# Patient Record
Sex: Male | Born: 1935
Health system: Southern US, Community
[De-identification: ages and names within clinical notes are randomized; demographics above are authoritative.]

## PROBLEM LIST (undated history)

## (undated) DIAGNOSIS — F0390 Unspecified dementia without behavioral disturbance: Secondary | ICD-10-CM

## (undated) DIAGNOSIS — J189 Pneumonia, unspecified organism: Secondary | ICD-10-CM

## (undated) DIAGNOSIS — I10 Essential (primary) hypertension: Secondary | ICD-10-CM

## (undated) DIAGNOSIS — H349 Unspecified retinal vascular occlusion: Secondary | ICD-10-CM

## (undated) DIAGNOSIS — I639 Cerebral infarction, unspecified: Secondary | ICD-10-CM

## (undated) DIAGNOSIS — I6529 Occlusion and stenosis of unspecified carotid artery: Secondary | ICD-10-CM

## (undated) HISTORY — DX: Pneumonia, unspecified organism: J18.9

## (undated) HISTORY — DX: Essential (primary) hypertension: I10

## (undated) HISTORY — PX: HERNIA REPAIR: SHX51

## (undated) HISTORY — PX: TRANSURETHRAL RESECTION OF PROSTATE: SHX73

## (undated) HISTORY — DX: Occlusion and stenosis of unspecified carotid artery: I65.29

## (undated) HISTORY — PX: PROSTATE SURGERY: SHX751

## (undated) HISTORY — DX: Unspecified retinal vascular occlusion: H34.9

## (undated) HISTORY — DX: Cerebral infarction, unspecified: I63.9

---

## 2002-09-24 ENCOUNTER — Ambulatory Visit (HOSPITAL_COMMUNITY): Admission: RE | Admit: 2002-09-24 | Discharge: 2002-09-24 | Payer: Self-pay | Admitting: Internal Medicine

## 2002-09-24 HISTORY — PX: COLONOSCOPY: SHX174

## 2003-10-08 HISTORY — PX: JOINT REPLACEMENT: SHX530

## 2003-10-10 ENCOUNTER — Other Ambulatory Visit: Admission: RE | Admit: 2003-10-10 | Discharge: 2003-10-10 | Payer: Self-pay | Admitting: Dermatology

## 2005-07-31 ENCOUNTER — Other Ambulatory Visit: Admission: RE | Admit: 2005-07-31 | Discharge: 2005-07-31 | Payer: Self-pay | Admitting: Dermatology

## 2006-04-08 ENCOUNTER — Ambulatory Visit (HOSPITAL_COMMUNITY): Admission: RE | Admit: 2006-04-08 | Discharge: 2006-04-08 | Payer: Self-pay | Admitting: Family Medicine

## 2007-12-30 DIAGNOSIS — H349 Unspecified retinal vascular occlusion: Secondary | ICD-10-CM

## 2007-12-30 HISTORY — DX: Unspecified retinal vascular occlusion: H34.9

## 2008-01-05 ENCOUNTER — Encounter (INDEPENDENT_AMBULATORY_CARE_PROVIDER_SITE_OTHER): Payer: Self-pay | Admitting: Family Medicine

## 2008-01-05 ENCOUNTER — Ambulatory Visit (HOSPITAL_COMMUNITY): Admission: RE | Admit: 2008-01-05 | Discharge: 2008-01-05 | Payer: Self-pay | Admitting: Family Medicine

## 2008-01-08 ENCOUNTER — Ambulatory Visit: Payer: Self-pay | Admitting: Vascular Surgery

## 2008-01-11 ENCOUNTER — Ambulatory Visit (HOSPITAL_COMMUNITY): Admission: RE | Admit: 2008-01-11 | Discharge: 2008-01-11 | Payer: Self-pay | Admitting: Vascular Surgery

## 2008-01-11 ENCOUNTER — Ambulatory Visit: Payer: Self-pay | Admitting: Vascular Surgery

## 2008-01-26 ENCOUNTER — Inpatient Hospital Stay (HOSPITAL_COMMUNITY): Admission: RE | Admit: 2008-01-26 | Discharge: 2008-01-27 | Payer: Self-pay | Admitting: Vascular Surgery

## 2008-01-26 ENCOUNTER — Encounter: Payer: Self-pay | Admitting: Vascular Surgery

## 2008-01-26 HISTORY — PX: CAROTID ENDARTERECTOMY: SUR193

## 2008-02-17 ENCOUNTER — Ambulatory Visit (HOSPITAL_COMMUNITY): Admission: RE | Admit: 2008-02-17 | Discharge: 2008-02-17 | Payer: Self-pay | Admitting: Family Medicine

## 2008-02-19 ENCOUNTER — Ambulatory Visit: Payer: Self-pay | Admitting: Vascular Surgery

## 2008-03-15 ENCOUNTER — Ambulatory Visit (HOSPITAL_COMMUNITY): Admission: RE | Admit: 2008-03-15 | Discharge: 2008-03-15 | Payer: Self-pay | Admitting: Internal Medicine

## 2008-03-15 ENCOUNTER — Ambulatory Visit: Payer: Self-pay | Admitting: Internal Medicine

## 2008-03-15 ENCOUNTER — Encounter: Payer: Self-pay | Admitting: Internal Medicine

## 2008-03-15 HISTORY — PX: COLONOSCOPY: SHX174

## 2008-08-12 ENCOUNTER — Ambulatory Visit: Payer: Self-pay | Admitting: Vascular Surgery

## 2009-02-24 ENCOUNTER — Ambulatory Visit: Payer: Self-pay | Admitting: Vascular Surgery

## 2009-04-25 ENCOUNTER — Inpatient Hospital Stay (HOSPITAL_COMMUNITY): Admission: RE | Admit: 2009-04-25 | Discharge: 2009-04-28 | Payer: Self-pay | Admitting: Orthopedic Surgery

## 2010-02-23 ENCOUNTER — Ambulatory Visit: Payer: Self-pay | Admitting: Vascular Surgery

## 2011-01-13 LAB — PROTIME-INR
INR: 1.2 (ref 0.00–1.49)
INR: 2.5 — ABNORMAL HIGH (ref 0.00–1.49)
Prothrombin Time: 13.9 seconds (ref 11.6–15.2)
Prothrombin Time: 15.9 seconds — ABNORMAL HIGH (ref 11.6–15.2)
Prothrombin Time: 22.3 seconds — ABNORMAL HIGH (ref 11.6–15.2)

## 2011-01-13 LAB — BASIC METABOLIC PANEL
CO2: 27 mEq/L (ref 19–32)
CO2: 28 mEq/L (ref 19–32)
Calcium: 7.6 mg/dL — ABNORMAL LOW (ref 8.4–10.5)
Chloride: 100 mEq/L (ref 96–112)
Chloride: 99 mEq/L (ref 96–112)
Creatinine, Ser: 1.13 mg/dL (ref 0.4–1.5)
GFR calc Af Amer: >60 mL/min (ref >60)
GFR calc Af Amer: >60 mL/min (ref >60)
GFR calc non Af Amer: 56 mL/min — ABNORMAL LOW (ref >60)
GFR calc non Af Amer: >60 mL/min (ref >60)
Potassium: 3.7 mEq/L (ref 3.5–5.1)
Potassium: 4.4 mEq/L (ref 3.5–5.1)
Sodium: 132 mEq/L — ABNORMAL LOW (ref 135–145)
Sodium: 132 mEq/L — ABNORMAL LOW (ref 135–145)

## 2011-01-13 LAB — URINALYSIS, ROUTINE W REFLEX MICROSCOPIC
Hgb urine dipstick: NEGATIVE
Ketones, ur: NEGATIVE mg/dL
Urobilinogen, UA: 0.2 mg/dL (ref 0.0–1.0)
pH: 6 (ref 5.0–8.0)

## 2011-01-13 LAB — COMPREHENSIVE METABOLIC PANEL
AST: 20 U/L (ref 0–37)
BUN: 16 mg/dL (ref 6–23)
GFR calc Af Amer: >60 mL/min (ref >60)
GFR calc non Af Amer: >60 mL/min (ref >60)
Glucose, Bld: 93 mg/dL (ref 70–99)
Total Bilirubin: 0.6 mg/dL (ref 0.3–1.2)
Total Protein: 6.7 g/dL (ref 6.0–8.3)

## 2011-01-13 LAB — CBC
HCT: 31.5 % — ABNORMAL LOW (ref 39.0–52.0)
Hemoglobin: 10.5 g/dL — ABNORMAL LOW (ref 13.0–17.0)
Hemoglobin: 13.5 g/dL (ref 13.0–17.0)
MCHC: 33.4 g/dL (ref 30.0–36.0)
MCHC: 34.5 g/dL (ref 30.0–36.0)
Platelets: 124 10*3/uL — ABNORMAL LOW (ref 150–400)
Platelets: 159 10*3/uL (ref 150–400)
RBC: 2.93 MIL/uL — ABNORMAL LOW (ref 4.22–5.81)
RDW: 14 % (ref 11.5–15.5)
WBC: 12.4 10*3/uL — ABNORMAL HIGH (ref 4.0–10.5)
WBC: 7 10*3/uL (ref 4.0–10.5)
WBC: 7.8 10*3/uL (ref 4.0–10.5)
WBC: 9.7 10*3/uL (ref 4.0–10.5)

## 2011-01-13 LAB — APTT: aPTT: 29 seconds (ref 24–37)

## 2011-01-13 LAB — ABO/RH: ABO/RH(D): A NEG

## 2011-02-19 ENCOUNTER — Other Ambulatory Visit (INDEPENDENT_AMBULATORY_CARE_PROVIDER_SITE_OTHER): Payer: Medicare Other

## 2011-02-19 DIAGNOSIS — I6529 Occlusion and stenosis of unspecified carotid artery: Secondary | ICD-10-CM

## 2011-02-19 DIAGNOSIS — Z48812 Encounter for surgical aftercare following surgery on the circulatory system: Secondary | ICD-10-CM

## 2011-02-19 NOTE — Procedures (Signed)
CAROTID DUPLEX EXAM   INDICATION:  Followup right carotid endarterectomy.   HISTORY:  Diabetes:  No.  Cardiac:  No.  Hypertension:  Yes.  Smoking:  Previous.  Previous Surgery:  Right carotid endarterectomy on 01/26/2008.  CV History:  Asymptomatic.  Amaurosis Fugax No, Paresthesias No, Hemiparesis No                                       RIGHT             LEFT  Brachial systolic pressure:         180               170  Brachial Doppler waveforms:         Normal            Normal  Vertebral direction of flow:        Not detected      Antegrade  DUPLEX VELOCITIES (cm/sec)  CCA peak systolic                   72                118  ECA peak systolic                   141               125  ICA peak systolic                   92                143  ICA end diastolic                   26                20  PLAQUE MORPHOLOGY:                                    Mixed  PLAQUE AMOUNT:                      None              Moderate  PLAQUE LOCATION:                                      ICA/ECA/CCA   IMPRESSION:  1. Patent right carotid endarterectomy site with no right internal      carotid artery stenosis.  2. 40%-59% stenosis of the left internal carotid artery.  3. Doppler velocities of the right internal carotid artery appear less      than previously recorded when compared to the previous exam on      02/24/2009 with the left internal carotid artery remaining stable.   ___________________________________________  Larina Earthly, M.D.   CH/MEDQ  D:  02/23/2010  T:  02/23/2010  Job:  [id number]

## 2011-02-19 NOTE — Assessment & Plan Note (Signed)
OFFICE VISIT   [name], [name]  DOB:  16-May-1925                                       02/19/2008  [id number]#:[id number]   The patient presents today for followup of his right carotid  endarterectomy on 04/21 for severe carotid disease.  He had had a  preoperative retinal stroke.  He did well with his carotid surgery.  He  was discharged to home on postoperative day 1.  He continues to do well  with the usual amount of peri-incisional numbness.  He has had no  neurologic deficits.  He has a well-healed right carotid incision with  no bruits bilaterally.  He has had no other focal deficits but does  remain without vision in his right eye.  I am quite pleased with his  recovery and plan to see him again in 6 months with repeat carotid  duplex and he will notify us should he develop any neurologic deficits  in the interim.   Larina Earthly, M.D.  Electronically Signed   TFE/MEDQ  D:  02/19/2008  T:  02/22/2008  Job:  [id number]   cc:   Patrica Duel, M.D.  Beulah Gandy. Ashley Royalty, M.D.

## 2011-02-19 NOTE — Discharge Summary (Signed)
Troy Thompson, ERION NO.:  [id number]   MEDICAL RECORD NO.:  [id number]          PATIENT TYPE:  INP   LOCATION:  2924                         FACILITY:  [facility]   PHYSICIAN:  Wilmon Arms, PA    DATE OF BIRTH:  Sep 19, 1925   DATE OF ADMISSION:  01/26/2008  DATE OF DISCHARGE:  01/27/2008                               DISCHARGE SUMMARY   REFERRING PHYSICIANS:  Patrica Duel, MD  Beulah Gandy. Ashley Royalty, MD   DISCHARGE DIAGNOSES:  1. Carotid occlusive disease.  2. Hypertension.  3. Sudden onset of blindness with a right retinal arterial embolus,      December 30, 2007.   PROCEDURES PERFORMED:  Right carotid endarterectomy by Dr. Arbie Cookey, January 26, 2008.   CONSULTATIONS:  None.   DISCHARGE MEDICATIONS:  He is instructed to resume his home medicines  consisting of,  1. Toprol 50 mg p.o. q.a.m.  2. Norvasc 5 mg p.o. q.a.m.  3. Benicar/hydrochlorothiazide 40/12.5 p.o. q.a.m.  4. Aspirin 81 mg p.o. q.a.m.  5. Plavix 75 mg p.o. q.a.m.  6. Lumigan eye drops 1 drop, right eye, q.p.m.  7. Prilosec p.r.n.  8. Tylox 1-2 p.o. q.4 h. p.r.n. pain.   DISPOSITION:  He is being discharged to home in stable condition with  his wound healing well.  He is to clean his wound with soap and warm  water.  He is to observe the wound for drainage, increased pain,  swelling, redness, fever greater than 101.2, or other neurological  problems.  He is given an appointment to return to see Dr. Arbie Cookey in 2  weeks for a followup.   Brief identifying statements and complete details, please refer the  typed history and physical.  Briefly, this very pleasant 77 year old  gentleman presented to Dr. Arbie Cookey for evaluation of extra-arterial  cerebrovascular occlusive disease.  This followed the onset of blindness  with a right retinal arterial embolism on December 30, 2007.  He was found  to have mild-to-moderate stenosis with plaque present at the right  carotid bifurcation.  He underwent further workup  and Dr. Arbie Cookey  recommended right carotid endarterectomy.  He was informed of the risks  and benefits of the procedure and after careful consideration elected to  proceed with surgery.   HOSPITAL COURSE:  Preoperative workup was completed as an outpatient.  He was brought in through same day surgery and underwent the  aforementioned right carotid endarterectomy.  For complete details,  please refer the typed  operative report.  The procedure was without complications.  He was  returned to the postanesthesia care unit extubated.  Following  stabilization, he was transferred to a bed in surgical intensive care  unit.  He was observed overnight.  His exam was stable the next morning.  He was felt stable and was discharged home.      Wilmon Arms, PA     KEL/MEDQ  D:  01/27/2008  T:  01/27/2008  Job:  [id number]   cc:   Larina Earthly, M.D.  Beulah Gandy. Ashley Royalty, M.D.  Patrica Duel, M.D.

## 2011-02-19 NOTE — Consult Note (Signed)
NAME:  Troy Thompson, Troy Thompson           ACCOUNT NO.:  [id number]   MEDICAL RECORD NO.:  [id number]          PATIENT TYPE:  AMB   LOCATION:  SDS                          FACILITY:  [facility]   PHYSICIAN:  Marin Roberts, MDDATE OF BIRTH:  [birth year]   DATE OF CONSULTATION:  DATE OF DISCHARGE:  01/11/2008                                 CONSULTATION   REFERRING PHYSICIAN:  Di Kindle. Edilia Bo, M.D.   REASON FOR CONSULTATION:  Intracranial interpretation of carotid  arteriogram performed January 11, 2008.   FINDINGS:  After receiving the request for consultation by Dr. Edilia Bo,  I have reviewed the intracranial portion of the patient's recent carotid  arteriogram.  Single plane, AP and lateral views from a right carotid  injection demonstrates normal distal intracranial internal carotid  artery.  The M1 and A1 segments are normal.  AC and MC branch vessels  are unremarkable.  There is a fetal-type right posterior cerebral  artery.  The dural sinuses fill normally.   Injection of the left common carotid artery is somewhat more tenuous.  In his report, Dr. Edilia Bo stated that catheter position was very  proximal.  There was some reflux which fills the right common carotid as  well.  The AP projection is faint.  Nonetheless, there is no evidence  for significant stenosis, aneurysmal branch vessel occlusion.  The  lateral projection demonstrates somewhat better opacification.  A fetal-  type posterior cerebral artery is again seen.   Intracranial imaging from a left subclavian injection demonstrates  retrograde flow into the right vertebral artery from the left-sided  injection.  If this was truly a subclavian injection, this may suggest  proximal vertebral artery disease.  If it is a left vertebral injection,  this may be within normal limits.  The basilar artery essentially ends  in the superior cerebellar arteries bilaterally.   IMPRESSION:  1. No significant stenosis, aneurysm, or  branch vessel occlusion.  2. Question proximal vertebral artery disease on the right.  Please      see the above discussion.      Marin Roberts, MD  Electronically Signed     CM/MEDQ  D:  01/12/2008  T:  01/12/2008  Job:  [id number]

## 2011-02-19 NOTE — Assessment & Plan Note (Signed)
OFFICE VISIT   Thompson, Troy  DOB:  June 05, 1925                                       08/12/2008  EAVWU#:[id number]   The patient presents today for followup of his extracranial  cerebrovascular occlusive disease.  He is status post right carotid  endarterectomy on 01/26/2008.  He has no deficits since my last visit  with him.  He did suffer a right retinal artery embolus and continue to  be blind in his right eye.  This was a preoperative event that initiated  the evaluation of his carotid arteries.  He specifically denies any new  visual changes and denies any transient ischemic attack or focal  neurologic deficits.  He does remain stable with no new major medical  problems since my last visit with him.  He does not smoke cigarettes.   PHYSICAL EXAM:  Well developed, well nourished white male appearing  stated age of 89.  Blood pressure is 179/74, pulse 68, respirations 16.  His radial pulses are 2+.  His right carotid incision is well healed.  He has no bruits bilaterally.   He underwent repeat carotid duplex today and this reveals a wide patency  of his endarterectomy site on the right with no evidence of stenosis.  He does have a moderate 40-59% stenosis in his left carotid.  I  discussed symptoms of carotid disease with the patient and his grand  daughter present.  I have recommended that we continue to follow him in  our noninvasive vascular lab to rule out any progression of his  asymptomatic stenosis in his left carotid system and also to follow up  his endarterectomy.  He will notify should he develop any deficits.   Larina Earthly, M.D.  Electronically Signed   TFE/MEDQ  D:  08/12/2008  T:  08/15/2008  Job:  [id number]   cc:   Patrica Duel, M.D.

## 2011-02-19 NOTE — Op Note (Signed)
NAME:  Troy Thompson, Troy Thompson           ACCOUNT NO.:  [id number]   MEDICAL RECORD NO.:  [id number]          PATIENT TYPE:  AMB   LOCATION:  SDS                          FACILITY:  [facility]   PHYSICIAN:  Di Kindle. Edilia Bo, M.D.DATE OF BIRTH:  01-22-1925   DATE OF PROCEDURE:  DATE OF DISCHARGE:  01/11/2008                               OPERATIVE REPORT   PREOPERATIVE DIAGNOSIS:  Right retinal infarct with mild carotid  disease.   POSTOPERATIVE DIAGNOSIS:  Right retinal infarct with mild carotid  disease.   OPERATION/PROCEDURE:  1. Ultrasound-guided access of the right common femoral artery.  2. Arch aortogram.  3. Selective innominate arteriogram.  4. Selective right common carotid arteriogram.  5. Selective left subclavian arteriogram.  6. Selective left common carotid arteriogram.   SURGEON:  Di Kindle. Edilia Bo, M.D.   ANESTHESIA:  Local.   TECHNIQUE:  The patient was taken to the PV lab at [facility].  The groin was  prepped and draped in the usual sterile fashion.  After the skin was  anesthetized with 1%  lidocaine and under ultrasound guidance, the right  common femoral artery was cannulated and guidewire introduced into the  infrarenal aorta under fluoroscopic control.  The 5-French sheath was  introduced over the wire.  Next a long pigtail catheter was positioned  in the ascending aortic arch, and then arch aortogram obtained at 40  degrees LAO projection.  This catheter was then exchanged for an H1  catheter which was positioned into the innominate artery.  The  innominate artery injection was then made in the 30-degree RAO  projection.  Next, using a Berenstein 1 catheter one catheter, I was  able to exchange the H1 catheter and positioned the Berenstein catheter  into the right common carotid artery.  The wire was then advanced and  the catheter advanced in the common carotid artery and selective right  common carotid arteriogram obtained.  Intracranial and  extracranial  views were obtained.  The intracranial views will be interpreted  separately by separately by the neuroradiologist.  Next, the catheter  was then drawn back into the arch and positioned into the left  subclavian artery.  The wire was advanced, the catheter then advanced  and selective left subclavian arteriogram obtained.  Next, using H1  catheter, I was able to select the innominate and then advanced the wire  and then pulled the H1 catheter back into the proximal left common  carotid artery.  The patient had a bovine arch which made the angulation  somewhat difficult.  Despite multiple attempts with both the [facility] wire  and angled Glidewire, I was never able to advance to the wire far enough  to maintain catheter position.  Ultimately I used a CK1 catheter,  positioned this into the proximal left common carotid artery and  selective arteriogram was obtained, again with both intracranial and  extracranial views.   FINDINGS:  The right vertebral artery is quite diminutive in size.  The  left vertebral artery is patent with no significant stenosis identified.  On the right side, there is a smooth 20-30% eccentric plaque in the  common carotid  artery with the small ulcer proximally which is fairly  smooth.  On the left, there is some calcific plaque of the aortic  bifurcation, but no significant stenosis is identified.  There is no  subclavian stenosis on either side.   CONCLUSIONS:  1. Mild 20-30% right internal carotid artery stenosis with small ulcer      proximally which is fairly smooth.  2. Left vertebral dominant.  3. Mild plaque at left common carotid artery bifurcation with no      significant stenosis.      Di Kindle. Edilia Bo, M.D.  Electronically Signed     CSD/MEDQ  D:  01/11/2008  T:  01/11/2008  Job:  [id number]

## 2011-02-19 NOTE — Procedures (Signed)
CAROTID DUPLEX EXAM   INDICATION:  Carotid artery disease.   HISTORY:  Diabetes:  No.  Cardiac:  No.  Hypertension:  Yes.  Smoking:  Previous.  Previous Surgery:  Right carotid endarterectomy on 01/26/08.  CV History:  Currently asymptomatic.  Amaurosis Fugax No, Paresthesias No, Hemiparesis No.                                       RIGHT             LEFT  Brachial systolic pressure:         132               136  Brachial Doppler waveforms:         Normal            Normal  Vertebral direction of flow:        Not detected      Antegrade  DUPLEX VELOCITIES (cm/sec)  CCA peak systolic                   65                102  ECA peak systolic                   195               168  ICA peak systolic                   130               173  ICA end diastolic                   30                24  PLAQUE MORPHOLOGY:                                    Mixed  PLAQUE AMOUNT:                      None              Mild/moderate  PLAQUE LOCATION:                                      ICA/ECA/CCA   IMPRESSION:  1. Widely patent right carotid endarterectomy site noted.  There is a      mildly increased Doppler velocity noted in the right proximal      internal carotid artery which appears to be most likely due to a      change in vessel diameter.  2. 40-59% stenosis of the left internal carotid artery.  3. Flow was not detected in the right vertebral artery.  4. No significant change noted when compared to the previous      examination on 08/12/08.   ___________________________________________  Larina Earthly, M.D.   CH/MEDQ  D:  02/24/2009  T:  02/24/2009  Job:  [id number]

## 2011-02-19 NOTE — Procedures (Signed)
CAROTID DUPLEX EXAM   INDICATION:  Followup right carotid endarterectomy.   HISTORY:  Diabetes:  No.  Cardiac:  No.  Hypertension:  Yes.  Smoking:  Quit about 40 years ago.  Previous Surgery:  No.  CV History:  No.  Amaurosis Fugax No, Paresthesias No, Hemiparesis No                                       RIGHT             LEFT  Brachial systolic pressure:         130               128  Brachial Doppler waveforms:         Biphasic          Biphasic  Vertebral direction of flow:        Not well visualized                 Antegrade  DUPLEX VELOCITIES (cm/sec)  CCA peak systolic                   81                131  ECA peak systolic                   235               131  ICA peak systolic                   118               155  ICA end diastolic                   28                23  PLAQUE MORPHOLOGY:                  None              Heterogeneous  PLAQUE AMOUNT:                      None              Moderate  PLAQUE LOCATION:                    None              CCA/ICA/ECA   IMPRESSION:  1. 40-59% stenosis noted in the left ICA.  2. Normal carotid duplex noted at the right ICA.  3. Status post right carotid endarterectomy.  4. Antegrade left vertebral artery.   ___________________________________________  Larina Earthly, M.D.   MG/MEDQ  D:  08/12/2008  T:  08/12/2008  Job:  [id number]

## 2011-02-19 NOTE — Op Note (Signed)
NAME:  Troy Thompson, Troy Thompson           ACCOUNT NO.:  [id number]   MEDICAL RECORD NO.:  [id number]          PATIENT TYPE:  AMB   LOCATION:  DAY                           FACILITY:  [facility]   PHYSICIAN:  R. Roetta Sessions, M.D. DATE OF BIRTH:  [birth year]   DATE OF PROCEDURE:  03/15/2008  DATE OF DISCHARGE:                               OPERATIVE REPORT   INDICATIONS FOR PROCEDURE:  The patient is an 77 year old Caucasian male  with a personal history of colonic adenoma.  Last exam was 2003.  She  had diverticulosis and a single adenoma removed.   Positive family history of colorectal cancer in his father but he was  diagnosed with the disease in the ninth decade of life.  Troy Thompson  has no symptoms, currently is here for surveillance.  The approach has  been discussed with the patient.  The risks, benefits and alternatives  of limitations have been reviewed.  All parties questions answered and  all parties agreeable.  Please see the documentation in medical record.   PROCEDURE NOTE:  O2 saturation, blood pressure, pulse and respirations  were monitored throughout the entire procedure.   CONSCIOUS SEDATION:  Versed 3 mg, Demerol 75 mg IV in divided doses.   INSTRUMENT:  Pentax video chip system.   FINDINGS:  Digital rectal exam revealed no abnormalities.  Endoscopic  findings:  The prep was good.  Colon:  Colonic mucosa was surveyed from  the rectosigmoid junction through the left transverse, right colon,  appendiceal orifice, cecum, and ileocecal valve.  These structures were  well seen and photographed for the record.  At the base of the cecum,  there was a diminutive polyp.  It was cold biopsied/ removed.  From this  level, the scope was withdrawn.  All previous mucosal surfaces were  again seen.  The patient had left-sided transverse diverticula and  colonic mucosa appeared normal.  The scope was pulled down the rectum.  Examination of the rectal mucosa including retroflexion of the  anal  verge demonstrated no abnormalities.  The patient tolerated the  procedure well and was reactive to endoscopy.   IMPRESSION:  1. Normal rectum.  2. Left-sided transverse diverticula.  3. Diminutive cecal polyp status post cold biopsy.  Remainder of the      colonic mucosa appeared normal.   RECOMMENDATIONS:  1. Diverticulosis, literature provided to Mr. Merilynn Finland.  2. Follow-up on path.  3. Further recommendations to follow.      Jonathon Bellows, M.D.  Electronically Signed     RMR/MEDQ  D:  03/15/2008  T:  03/16/2008  Job:  [id number]

## 2011-02-19 NOTE — Op Note (Signed)
Troy Thompson, Troy Thompson           ACCOUNT NO.:  [id number]   MEDICAL RECORD NO.:  [id number]          PATIENT TYPE:  INP   LOCATION:  2924                         FACILITY:  [facility]   PHYSICIAN:  Larina Earthly, M.D.    DATE OF BIRTH:  10-01-1925   DATE OF PROCEDURE:  01/26/2008  DATE OF DISCHARGE:                               OPERATIVE REPORT   PREOPERATIVE DIAGNOSIS:  Right retinal embolus with probable carotid  source.   POSTOPERATIVE DIAGNOSIS:  Right retinal embolus with probable carotid  source.   PROCEDURE:  Right carotid endarterectomy and primary closure.   SURGEON:  Larina Earthly, MD   ASSISTANT:  Nurse.   ANESTHESIA:  General endotracheal.   COMPLICATIONS:  None.   DISPOSITION:  Recovery room, stable.   PROCEDURE IN DETAIL:  The patient was taken to operating room and placed  in supine position.  The area of the right neck was prepped and draped  in usual sterile fashion.  Incision was made on the anterior  sternocleidomastoid and carried down to the platysma with  electrocautery.  The sternocleidomastoid was reflected posteriorly and  the carotid sheath was opened.  The facial vein was ligated and divided.  Dissection was then continued onto the bifurcation.  The common carotid  artery was encircled with umbilical tape and Rumel tourniquet.  Dissection was turned onto the bifurcation.  The external carotid was  encircled with a blue vessel loop.  The internal carotid was encircled  with an umbilical tape and Rumel tourniquet.  The vagus and hypoglossal  nerves were identified and preserved.  The patient was given 8000 units  of intravenous heparin.  After adequate circulation time, the internal,  external, and common carotid arteries were occluded.  The common carotid  artery was opened with an 11-blade extending using with Potts scissors  through the plaque onto the internal carotid with Potts scissors.  A 10  shunt was passed up the internal carotid artery,  allowed the back bleed,  then down the common carotid where it was secured with Rumel  tourniquets.  There was excellent backbleeding.  The patient did have a  posterior plaque with ulceration in the posterior aspect of the carotid  bulb.  The endarterectomy was begun on the common carotid artery and the  plaque was divided proximally with Potts scissors.  The endarterectomy  was carried onto the bifurcation.  The external carotid  endarterectomized by eversion technique and internal carotid artery was  endarterectomized in an open fashion.  Remaining atheromatous removed  from endarterectomy plane.  Due to the large caliber of the internal  carotid artery, a decision was made to close the artery primarily.  This  was done with a running 6-0 Prolene suture beginning at the proximal and  distal arteriotomy and sewing towards the center.  Prior to completion  of the closure, the shunt was removed, and the usual flushing maneuvers  were undertaken.  The anastomosis was completed and flow was restored  first to the external, then the internal carotid arteries.  Excellent  flow characteristics were noted with handheld Doppler in the internal  and external carotid arteries.  The patient was given 50 mg of protamine  reverse heparin.  Wounds were irrigated with saline.  Hemostasis  obtained with electrocautery.  Wounds were closed with 3-0 Vicryl in the  subcutaneous tissue to reapproximate sternocleidomastoid over the  carotid sheath.  Next, the  platysma was closed with running 3-0 Vicryl suture, and finally the skin  was closed with a 4-0 subcuticular Vicryl stitch.  Sterile dressing was  applied.  The patient was awakened, neurologically intact in the  operative room, and transferred to recovery in stable condition.      Larina Earthly, M.D.  Electronically Signed     TFE/MEDQ  D:  01/26/2008  T:  01/27/2008  Job:  [id number]   cc:   Beulah Gandy. Ashley Royalty, M.D.  Patrica Duel, M.D.

## 2011-02-19 NOTE — Consult Note (Signed)
NEW PATIENT CONSULTATION   Troy Thompson, Troy Thompson  DOB:  15-Jan-1925                                       01/08/2008  WUXLK#:[id number]   Patient presents today for evaluation of extraarterial  cerebrovascular  occlusive disease.  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  He has had minimal recovery from  this.  He has no prior history of strokes, amaurosis fugax, or transient  ischemic attack.  He does have a history of hypertension, otherwise is  healthy.  He has no significant cardiac disease.   FAMILY HISTORY:  Negative for premature atherosclerotic disease.   SOCIAL HISTORY:  He is married.  He is retired.  He does not smoke,  having quit 20 years ago and does not drink alcohol on a regular basis.   REVIEW OF SYSTEMS:  Positive for pain in his legs with walking,  arthritis, and no recent eyesight changes noted.   ALLERGIES TO MEDICATIONS:  None.   CURRENT MEDICATIONS:  Toprol, Norvasc, Benicar, aspirin, and Plavix.   PHYSICAL EXAMINATION:  Blood pressure is 196/85 in his right arm, 184/80  in his left arm.  Pulse 62, respirations 18.  Oxygen saturations are 96%  on room air.  He does have no carotid bruits bilaterally.  His radial  pulses are 2+ bilaterally.  He has 2+ femoral pulses bilaterally.  Heart:  Regular rate and rhythm.  Chest:  Clear bilaterally.   I have reviewed his carotid duplex with him.  This reveals mild-to-  moderate stenosis with some plaque present in the right carotid  bifurcation.  I explained that he could have irregular ulcerative plaque  that would be hidden and would not be apparent by duplex.  I have  recommended cerebral arteriography to rule out embolic source.  He will  undergo this at [facility] by Dr. Cari Caraway on 01/11/08.  He will continue his aspirin.  We will hold his Plavix until after the  procedure.   Larina Earthly, M.D.  Electronically Signed   TFE/MEDQ   D:  01/08/2008  Thompson:  01/11/2008  Job:  [id number]   cc:   Patrica Duel, M.D.  Beulah Gandy. Ashley Royalty, M.D.

## 2011-02-19 NOTE — H&P (Signed)
NAME:  Troy Thompson, Troy Thompson           ACCOUNT NO.:  [id number]   MEDICAL RECORD NO.:  [id number]          PATIENT TYPE:  INP   LOCATION:  NA                           FACILITY:  [facility]   PHYSICIAN:  Ollen Gross, M.D.    DATE OF BIRTH:  02-Aug-1925   DATE OF ADMISSION:  DATE OF DISCHARGE:                              HISTORY & PHYSICAL   CHIEF COMPLAINT:  Bilateral knee pain worse in the right than in the  left.   HISTORY OF PRESENT ILLNESS:  Patient is an 77 year old male who has  continued bilateral knee pain.  Patient has been followed by Dr. Lequita Halt  for this.  He has had a series of Synvisc injections that did not give  him a tremendous amount of relief.  The patient's pain seems to be  getting worse with activity and is starting to limit what he is able to  do.  Patient has end-stage arthritis in the right knee and now presents  to have a total knee arthroplasty in the right knee and also a cortisone  injection in the left knee.   ALLERGIES:  None.   CURRENT MEDICATIONS:  1. Toprol 50 mg 1/2 tablet daily.  2. Norvasc 5 mg daily.  3. Aspirin 81 mg daily.  4. Diovan 320 mg daily.  5. Plavix 75 mg daily.  6. Zocor 20 mg daily.  7. Prilosec 20 mg daily.   PAST MEDICAL HISTORY:  1. Stroke in March 2009.  2. Hypertension.  3. Arthritis.   PAST SURGICAL HISTORY:  1. Carotid endarterectomy, this was done in 2009 while the patient was      staying in the hospital after his stroke.  2. Prostate surgery.  3. Patient has had 2 hernia repairs.   FAMILY HISTORY:  Father passed at age 41 of colon cancer.  Mother passed  at age 4 of heart attack.   SOCIAL HISTORY:  The patient is married, he is retired, he lives at  home.  Patient does not drink and patient does not smoke.  The patient  has 2 children.  The patient states that he has family and support lined  up for caregivers to provide care after surgery and is planning on  returning to his home after surgery.   REVIEW OF  SYSTEMS:  No fevers, chills or weight change.  HEENT/NEURO:  No headache, no balance issues.  Positive for blurred vision in his left  eye.  Note that this is not a new occurrence.  The patient states he has  had vision problems for many years in that eye.  RESPIRATORY:  Negative  for shortness of breath.  Negative for cough.  Negative for wheezing.  Patient states his last chest x-ray was 2009.  CARDIOVASCULAR:  Negative  for chest pain, negative palpitations.  The patient states his last  electrocardiogram was also 2009.  GI:  Negative for nausea, negative for  vomiting, negative for blood in his stool.  GU:  Negative for painful  urination, negative for blood in urine, positive for urinating at night  time.  MUSCULOSKELETAL:  Positive for joint pain medially in  the knees  bilaterally.  Negative for muscular weakness.  Note, patient has no  residual neurologic deficit post CVA.   PHYSICAL EXAMINATION:  Pulse 68, respirations 18, blood pressure was a  little high, in his left arm it was 175/80 and in his right arm it was  170/75.  Patient states that he has white coat syndrome and it is  commonly elevated in the doctor's office.  GENERAL:  An 77 year old male in no acute distress, alert and oriented  x3.  The patient seems to be a good historian.  HEENT:  Unremarkable.  NECK:  Supple, full range of motion, negative for lymphadenopathy.  CHEST:  The patient's lungs are clear with equal breath sounds  bilaterally without rales, rhonchi or wheezes.  HEART:  Regular rate and rhythm, distant heart sounds without murmur.  ABDOMEN:  Soft, nontender.  Bowel sounds present.  No guarding with  palpitation.  EXTREMITIES:  Right knee range of motion 5 to 120 degrees, negative for  effusion, moderate crepitus with palpation on range of motion and he is  tender more so on the medial aspect of the knee than the lateral.  Left  knee range of motion is 0 to 130 degrees, he does have crepitus on   palpation with range of motion and it is negative for instability.  PERIPHERAL VASCULAR:  Negative for carotid bruit.  Carotid pulses are  2+.  Radial pulses are 2+ bilaterally.  Dorsalis pedis pulses are 2+  bilaterally.   IMPRESSION:  Osteoarthritis, right knee.   PLAN OF ACTION:  Patient is to undergo right total knee arthroplasty.  Note, patient has been seen and cleared for surgery by his primary care  physician, Dr. Patrica Duel, we are waiting on this paperwork.      Rozell Searing, [facility]      Ollen Gross, M.D.  Electronically Signed    LD/MEDQ  D:  04/23/2009  T:  04/23/2009  Job:  [id number]

## 2011-02-19 NOTE — Op Note (Signed)
Troy Thompson, Troy Thompson           ACCOUNT NO.:  [id number]   MEDICAL RECORD NO.:  [id number]          PATIENT TYPE:  INP   LOCATION:  0003                         [facility]ACILITY:  [facility]   PHYSICIAN:  Ollen Gross, M.D.    DATE OF BIRTH:  01-09-1925   DATE OF PROCEDURE:  04/25/2009  DATE OF DISCHARGE:                               OPERATIVE REPORT   PREOPERATIVE DIAGNOSIS:  Osteoarthritis, bilateral knees.   POSTOPERATIVE DIAGNOSIS:  Osteoarthritis, bilateral knees.   PROCEDURES:  1. Right total knee arthroplasty.  2. Left knee cortisone injection.   SURGEON:  Ollen Gross, M.D.   ASSISTANT:  Rozell Searing, PA-C.   ANESTHESIA:  Spinal with Duramorph.   ESTIMATED BLOOD LOSS:  Minimal.   DRAINS:  None.   TOURNIQUET TIME:  41 minutes at 300 mmHg.   COMPLICATIONS:  None.   CONDITION:  Stable to recovery room.   CLINICAL NOTE:  Troy Thompson is an 77 year old male who has end-stage  arthritis of the right worse than left knee.  The right knee is far more  symptomatic.  He has failed nonoperative management and presents now for  right total knee arthroplasty.  In addition, he requested a left knee  cortisone injection.   PROCEDURE IN DETAIL:  After successful administration of spinal  anesthetic, a tourniquet is placed high on his right thigh and the right  lower extremity was prepped and draped in the usual sterile fashion.  The extremity is wrapped in an Esmarch, the knee flexed, the tourniquet  inflated to 300 mmHg.  A midline incision is made with a 10 blade  through subcutaneous tissue to the level of the extensor mechanism.  A  fresh blade is used make a medial parapatellar arthrotomy.  Soft tissue  on the proximal medial tibia is subperiosteally elevated to the joint  line with a knife and into the semimembranosus bursa with a Cobb  elevator.  The soft tissue laterally is elevated, with attention being  paid to avoid patellar tendon on tibial tubercle.  The patella  is  subluxated laterally, the knee flexed 90 degrees, and the ACL and PCL  removed.  A drill is used create a starting hole in the distal femur.  The canal is thoroughly irrigated.  The 5-degree right valgus alignment  guide is placed referencing off the posterior condyles, rotation is  marked, and a block pinned to remove 11 mm off the distal femur.  Distal  femoral resection is made with an oscillating saw.  I took 11 mm because  of the flexion contracture.  A sizing block is then placed.  A size 5 is  most appropriate.  Rotation is marked at the epicondylar axis.  A size 5  cutting block is placed and then the anterior-posterior and chamfer cuts  made.   The tibia is subluxated forward and the menisci are removed.  An  extramedullary tibial alignment guide is placed referencing proximally  at the medial aspect of the tibial tubercle and distally along the  second metatarsal axis and tibial crest.  The block is pinned to remove  about 2 mm off the more  deficient medial side.  Tibial resection is made  with an oscillating saw.  Size 4 is the most appropriate tibial  component and the proximal tibia is prepared with the modular drill and  keel punch for the size 4.  Femoral preparation is completed with the  intercondylar cut.   The size 4 mobile bearing tibial trial and size 5 posterior stabilized  femoral trial and a 10-mm posterior stabilized rotating platform insert  trial are placed.  With the 10, full extension is achieved, with  excellent varus-valgus and anterior-posterior balance throughout full  range of motion.  The patella is everted and the thickness measured to  be 24 mm.  Freehand resection is taken to 14 mm, the 38 template is  placed, lug holes are drilled, trial patella is placed and it tracks  normally.  Osteophytes are removed off the posterior femur with the  trial in place.  All the trials are removed and the cut bone surfaces  prepared with pulsatile lavage.   Cement is mixed and once ready for  implantation, the size 4 mobile bearing tibial tray, size 5 posterior  stabilized femur and 38 patella are cemented into place and the patella  is held with a clamp.  The trial 10-mm insert is placed, the knee held  in full extension, and all extruded cement removed.  The cement is fully  hardened, then the wound is irrigated with saline solution and FloSeal  injected on the posterior capsule, medial and lateral gutters, and  suprapatellar area.  A moist sponge is placed and the tourniquet  released for a total time of 41 minutes.  The sponge is held for 2  minutes and removed.  Minimal bleeding is encountered.  The bleeding  that is encountered is stopped with electrocautery.  The wound is  copiously irrigated with saline solution and the arthrotomy closed with  interrupted #1 PDS.  Flexion against gravity is about 140 degrees.  The  subcu is closed with interrupted 2-0 Vicryl and subcuticular running 4-0  Monocryl.  The incision is cleaned and dried and Steri-Strips and a  bulky sterile dressing applied.  He is then placed into a knee  immobilizer.  I then prepped the left knee and injected it with  lidocaine and Depo-Medrol with no problems.  He tolerated that procedure  well and is subsequently awakened and transported to recovery in stable  condition.      Ollen Gross, M.D.  Electronically Signed     FA/MEDQ  D:  04/25/2009  T:  04/26/2009  Job:  [id number]

## 2011-02-22 NOTE — Op Note (Signed)
NAME:  Troy Thompson, Troy Thompson                     ACCOUNT NO.:  [id number]   MEDICAL RECORD NO.:  [id number]                   PATIENT TYPE:  AMB   LOCATION:  DAY                                  FACILITY:  [facility]   PHYSICIAN:  R. Roetta Sessions, M.D.              DATE OF BIRTH:  19-Aug-1925   DATE OF PROCEDURE:  09/24/2002  DATE OF DISCHARGE:                                 OPERATIVE REPORT   PROCEDURE:  Colonoscopy with biopsy.   ENDOSCOPIST:  Gerrit Friends. Rourk, M.D.   INDICATIONS FOR PROCEDURE:  The patient is a 77 year old gentleman referred  at the request of Dr. Nobie Putnam for high-risk colorectal cancer screening.  Family history is significant in that his father succumbed to colorectal  neoplasia.  He had a sigmoidoscopy in 1998 and was found to have only  diverticulosis.  Colonoscopy is now being done as a high risk screening  maneuver.  This approach has been discussed with the patient previously and  again today, at the beside.  The potential risks, benefits, and alternatives  have been reviewed.  Questions answered.  He is agreeable.  I feel that he  is low risk for conscious sedation.  ASA 2.   DESCRIPTION OF PROCEDURE:  O2 saturation, blood pressure, pulse and  respirations were monitored throughout the entirety of the procedure.   CONSCIOUS SEDATION:  Versed 4 mg IV, Demerol 100 mg IV in divided doses.   INSTRUMENT:  Olympus video chip adult colonoscope.   FINDINGS:  Digital rectal exam revealed no abnormalities.   ENDOSCOPIC FINDINGS:  The prep was good.   RECTUM:  Examination of the rectal mucosa including the retroflex view of  the anal verge revealed no abnormalities.   COLON:  The colonic mucosa was surveyed from the rectosigmoid junction  through the left transverse and right colon through the area of the  appendiceal orifice, ileocecal valve, and cecum.  These structures were well  seen and photographed for the record.  The patient was noted to have a 4 mm  polyp at the splenic flexure and a left-sided diverticulum.  The remainder  of the colonic mucosa appeared normal.   From the level of the cecum and ileocecal valve the scope was slowly  withdrawn.  All previously mentioned mucosal surfaces were again seen; no  other abnormalities were observed.  The polyp at the splenic flexure was  cold biopsied/removed.  The patient tolerated the procedure well and was  reacted in endoscopy.   IMPRESSION:  1. Normal rectum.  2. Left-sided diverticulum.  3. Polyp at the splenic flexure removed as described above.  4. The remainder of the colonic mucosa appeared normal.   RECOMMENDATIONS:  1. Follow up on path.  2. Diverticulosis literature provided the patient.  3. Further recommendations to follow.  Jonathon Bellows, M.D.    RMR/MEDQ  D:  09/24/2002  T:  09/25/2002  Job:  [id number]

## 2011-02-22 NOTE — Discharge Summary (Signed)
Troy Thompson, Troy Thompson           ACCOUNT NO.:  [id number]   MEDICAL RECORD NO.:  [id number]          PATIENT TYPE:  INP   LOCATION:  1618                         FACILITY:  [facility]   PHYSICIAN:  Ollen Gross, M.D.    DATE OF BIRTH:  1925/03/17   DATE OF ADMISSION:  04/25/2009  DATE OF DISCHARGE:  04/28/2009                               DISCHARGE SUMMARY   ADMITTING DIAGNOSES:  1. Osteoarthritis, right knee.  2. History of stroke, March 2009.  3. Hypertension.  4. Arthritis.   DISCHARGE DIAGNOSES:  1. Osteoarthritis, bilateral knees, status post right total knee      arthroplasty, left knee cortisone injection.  2. Postop hyponatremia.  3. History of stroke, March 2009.  4. Hypertension.  5. Arthritis.   PROCEDURE:  April 25, 2009, right total knee with left knee cortisone  injection.   SURGEON:  Dr. Lequita Halt.   ASSISTANT:  Rozell Searing, P.A.-C.   ANESTHESIA:  Spinal with Duramorph.   TOURNIQUET TIME:  41 minutes.   CONSULTS:  None.   BRIEF HISTORY:  Troy Thompson is an 77 year old male with end-stage  arthritis of the right greater than left knee pain, right is far more  symptomatic.  Failed nonoperative management.  Now presents for a total  knee arthroplasty.   LABORATORY DATA:  Preop CBC, hemoglobin of 13.5, hematocrit of 40.3,  white cell count 7.0, platelets 170.  Hemoglobin dropped down to 10.5,  then 8.9, back up a little bit to 9.1 and 26.3 crit.  PT/PTT on  admission 13.9 and 29 with INR 1.0.  Serial pro times followed per  Coumadin protocol, last noted PT/INR 28.4 and 2.5.  Chem panel on  admission all within normal limits.  Serial BMETs were followed.  Sodium  dropped down from 140 to 132 where it stabilized, last noted at 132.  Remaining electrolytes remained within normal limits.  Glucose did go up  from 93 to 172, back down to 111.  Preop UA was negative.  Preop EKG  dated March 21, 2009, sinus rhythm, unable to read signature.   HOSPITAL COURSE:   Patient admitted to [facility], taken to the  OR.  Underwent above-stated procedure without complication.  Patient  tolerated procedure well and later transferred to recovery room on  orthopedic floor.  Started on PCA and p.o. analgesic pain control  following surgery.  He was given 24 hours of postop IV antibiotics.  Had  a good night.  Seen on day 1.  Sodium was down a little bit, felt to be  a dilutional component.  Had a low output so we had to give some extra  fluids and the output was monitored closely.  Hemoglobin was down to  10.5.  Started getting up out of bed.  Walked about 30 feet on day 1.  By day 2, even up to about 80 feet.  Dressing changed.  Incision looked  good.  Hemoglobin was down a little bit further to 8.9.  Sodium  stabilized.  Patient started diuresing fluids very well and voiding off  the volume overload.  Discontinued the fluids.  Continued to  work with  therapy and by the following day hemoglobin was back up a little bit to  9.1.  Output looked good.  Sodium was stable.  Tolerating meds, met  goals, and discharged home.   DISCHARGE PLAN:  1. Patient discharged home on April 28, 2009.  2. Discharge diagnoses:  Please see above.  3. Discharge meds:  Percocet, Robaxin, Nu-Iron, Coumadin.   DIET:  Heart-healthy diet.   ACTIVITY:  Total knee protocol.  Home health PT.  Home health nursing.   FOLLOWUP:  2 weeks.   DISPOSITION:  Home.   CONDITION UPON DISCHARGE:  Improving.      Troy Thompson, P.A.C.      Ollen Gross, M.D.  Electronically Signed    ALP/MEDQ  D:  05/10/2009  T:  05/10/2009  Job:  [id number]   cc:   Ollen Gross, M.D.  Fax: [phone]   Patrica Duel, M.D.  Fax: [phone]

## 2011-02-27 NOTE — Procedures (Unsigned)
CAROTID DUPLEX EXAM  INDICATION:  Follow up carotid artery disease.  HISTORY: Diabetes:  No. Cardiac:  No. Hypertension:  Yes. Smoking:  Previous. Previous Surgery:  Right carotid endarterectomy, 01/26/2008. CV History:  Patient is currently asymptomatic. Amaurosis Fugax No, Paresthesias No, Hemiparesis No.                                      RIGHT              LEFT Brachial systolic pressure:         180                182 Brachial Doppler waveforms:         WNL                WNL Vertebral direction of flow:        Antegrade/collateral                 Antegrade DUPLEX VELOCITIES (cm/sec) CCA peak systolic                   76                 113 ECA peak systolic                   165                155 ICA peak systolic                   95                 149 ICA end diastolic                   21                 21 PLAQUE MORPHOLOGY:                                     Heterogenous PLAQUE AMOUNT:                                         Moderate PLAQUE LOCATION:                                       CCA, ICA, ECA  IMPRESSION: 1. Patent right carotid endarterectomy with no hemodynamically     significant stenosis. 2. Right external carotid artery stenosis. 3. Collateral flow visualized within the right vertebral.  However, I     do believe it is occluded, based on previous studies. 4. Left ICA 40-59% stenosis. 5. Prominent left vertebral. 6. No significant changes from previous study.      ___________________________________________ Larina Earthly, M.D.  OD/MEDQ  D:  02/19/2011  T:  02/19/2011  Job:  [id number]

## 2011-07-02 LAB — TYPE AND SCREEN
ABO/RH(D): A NEG
Antibody Screen: NEGATIVE

## 2011-07-02 LAB — URINALYSIS, ROUTINE W REFLEX MICROSCOPIC
Nitrite: NEGATIVE
Specific Gravity, Urine: 1.019
Urobilinogen, UA: 0.2

## 2011-07-02 LAB — BASIC METABOLIC PANEL
BUN: 25 — ABNORMAL HIGH
CO2: 22
Calcium: 7.8 — ABNORMAL LOW
Creatinine, Ser: 1.75 — ABNORMAL HIGH
Glucose, Bld: 126 — ABNORMAL HIGH
Sodium: 135

## 2011-07-02 LAB — PROTIME-INR: INR: 1

## 2011-07-02 LAB — COMPREHENSIVE METABOLIC PANEL
AST: 17
Albumin: 4.3
Albumin: 4.1
BUN: 19
BUN: 17
CO2: 27
Calcium: 9.4
Creatinine, Ser: 1.17
Creatinine, Ser: 0.98
GFR calc Af Amer: >60
GFR calc non Af Amer: 60 — ABNORMAL LOW
Sodium: 139
Total Bilirubin: 0.4
Total Protein: 7.1

## 2011-07-02 LAB — CBC
HCT: 41.7
Hemoglobin: 11 — ABNORMAL LOW
MCHC: 34.2
MCHC: 34.1
MCV: 88.3
Platelets: 195
Platelets: 211
RDW: 14.8
RDW: 14.8
RDW: 14.8
WBC: 8.1

## 2011-07-02 LAB — APTT
aPTT: 30
aPTT: 32

## 2012-01-02 ENCOUNTER — Encounter (INDEPENDENT_AMBULATORY_CARE_PROVIDER_SITE_OTHER): Payer: Medicare Other | Admitting: Ophthalmology

## 2012-01-02 DIAGNOSIS — H35039 Hypertensive retinopathy, unspecified eye: Secondary | ICD-10-CM

## 2012-01-02 DIAGNOSIS — I10 Essential (primary) hypertension: Secondary | ICD-10-CM

## 2012-01-02 DIAGNOSIS — H47219 Primary optic atrophy, unspecified eye: Secondary | ICD-10-CM

## 2012-01-02 DIAGNOSIS — H3530 Unspecified macular degeneration: Secondary | ICD-10-CM

## 2012-01-02 DIAGNOSIS — H3410 Central retinal artery occlusion, unspecified eye: Secondary | ICD-10-CM

## 2012-01-02 DIAGNOSIS — H43819 Vitreous degeneration, unspecified eye: Secondary | ICD-10-CM

## 2012-02-12 ENCOUNTER — Encounter: Payer: Self-pay | Admitting: Neurosurgery

## 2012-02-24 ENCOUNTER — Encounter: Payer: Self-pay | Admitting: Neurosurgery

## 2012-02-25 ENCOUNTER — Encounter: Payer: Self-pay | Admitting: Neurosurgery

## 2012-02-25 ENCOUNTER — Ambulatory Visit (INDEPENDENT_AMBULATORY_CARE_PROVIDER_SITE_OTHER): Payer: Medicare Other | Admitting: Vascular Surgery

## 2012-02-25 ENCOUNTER — Ambulatory Visit (INDEPENDENT_AMBULATORY_CARE_PROVIDER_SITE_OTHER): Payer: Medicare Other | Admitting: Neurosurgery

## 2012-02-25 VITALS — BP 167/71 | HR 47 | Resp 16 | Ht 70.5 in | Wt 199.1 lb

## 2012-02-25 DIAGNOSIS — I6529 Occlusion and stenosis of unspecified carotid artery: Secondary | ICD-10-CM

## 2012-02-25 DIAGNOSIS — Z48812 Encounter for surgical aftercare following surgery on the circulatory system: Secondary | ICD-10-CM

## 2012-02-25 NOTE — Progress Notes (Signed)
[facility] [place] HISTORY AND PHYSICAL   CC: Annual carotid duplex for known stenosis Referring Physician: Early  History of Present Illness: 78 year old patient of Dr. Arbie Cookey who is followed for known carotid stenosis. Patient had a right CEA in April 2009 and has had no problems since that time. He reports no signs or symptoms of CVA, TIA, diplopia, dysphasia, amaurosis fugax or word finding difficulty. Patient also states he hasn't had any new medical problems or any recent surgeries.  Past Medical History  Diagnosis Date  . Hypertension   . Carotid artery occlusion   . Retinal embolus, right 12/30/2007    Sudden onset of blindness   . Stroke     ROS: [x]  Positive   [ ]  Denies    General: [ ]  Weight loss, [ ]  Fever, [ ]  chills Neurologic: [ ]  Dizziness, [ ]  Blackouts, [ ]  Seizure [ ]  Stroke, [ ]  "Mini stroke", [ ]  Slurred speech, [ ]  Temporary blindness; [ ]  weakness in arms or legs, [ ]  Hoarseness Cardiac: [ ]  Chest pain/pressure, [ ]  Shortness of breath at rest [ ]  Shortness of breath with exertion, [ ]  Atrial fibrillation or irregular heartbeat Vascular: [ ]  Pain in legs with walking, [ ]  Pain in legs at rest, [ ]  Pain in legs at night,  [ ]  Non-healing ulcer, [ ]  Blood clot in vein/DVT,   Pulmonary: [ ]  Home oxygen, [ ]  Productive cough, [ ]  Coughing up blood, [ ]  Asthma,  [ ]  Wheezing Musculoskeletal:  [ ]  Arthritis, [ ]  Low back pain, [ ]  Joint pain Hematologic: [ ]  Easy Bruising, [ ]  Anemia; [ ]  Hepatitis Gastrointestinal: [ ]  Blood in stool, [ ]  Gastroesophageal Reflux/heartburn, [ ]  Trouble swallowing Urinary: [ ]  chronic Kidney disease, [ ]  on HD - [ ]  MWF or [ ]  TTHS, [ ]  Burning with urination, [ ]  Difficulty urinating Skin: [ ]  Rashes, [ ]  Wounds Psychological: [ ]  Anxiety, [ ]  Depression   Social History History  Substance Use Topics  . Smoking status: Former Smoker -- 22 years    Types: Cigarettes  . Smokeless tobacco: Not on file  .  Alcohol Use: No    Family History History reviewed. No pertinent family history.  Not on File  Current Outpatient Prescriptions  Medication Sig Dispense Refill  . amLODipine (NORVASC) 5 MG tablet Take 5 mg by mouth daily.      Marland Kitchen aspirin 81 MG tablet Take 81 mg by mouth daily.      . metoprolol succinate (TOPROL-XL) 50 MG 24 hr tablet Take 50 mg by mouth daily. Take with or immediately following a meal.      . metoprolol tartrate (LOPRESSOR) 25 MG tablet Take 25 mg by mouth Daily.      Marland Kitchen omeprazole (PRILOSEC) 10 MG capsule Take 10 mg by mouth daily.      . simvastatin (ZOCOR) 20 MG tablet Take 20 mg by mouth Daily.      . valsartan-hydrochlorothiazide (DIOVAN-HCT) 320-12.5 MG per tablet Take 320 mg by mouth Daily. 320-12.5mg       . bimatoprost (LUMIGAN) 0.03 % ophthalmic solution 1 drop at bedtime.      . clopidogrel (PLAVIX) 75 MG tablet Take 75 mg by mouth daily.      Marland Kitchen olmesartan-hydrochlorothiazide (BENICAR HCT) 40-12.5 MG per tablet Take 1 tablet by mouth daily.      Marland Kitchen oxyCODONE-acetaminophen (TYLOX) 5-500 MG per capsule Take 1 capsule by mouth every 4 (  four) hours as needed.        Physical Examination  Filed Vitals:   02/25/12 1201  BP: 167/71  Pulse: 47  Resp: 16    Body mass index is 28.16 kg/(m^2).  General:  WDWN in NAD Gait: Normal HEENT: WNL Eyes: Pupils equal Pulmonary: normal non-labored breathing , without Rales, rhonchi,  wheezing Cardiac: RRR, without  Murmurs, rubs or gallops; Abdomen: soft, NT, no masses Skin: no rashes, ulcers noted  Vascular Exam Pulses: 2+ radial pulses bilaterally, palpable lower extremity pulses bilaterally Carotid bruits: Bilateral carotid pulses to auscultation no bruits are heard Extremities without ischemic changes, no Gangrene , no cellulitis; no open wounds;  Musculoskeletal: no muscle wasting or atrophy   Neurologic: A&O X 3; Appropriate Affect ; SENSATION: normal; MOTOR FUNCTION:  moving all extremities equally. Speech  is fluent/normal  Non-Invasive Vascular Imaging CAROTID DUPLEX 02/25/2012  Right ICA 0 - 19% stenosis Left ICA 40 - 59 % stenosis   ASSESSMENT/PLAN: Asymptomatic carotid stenosis, history of right  CEA in 2009, left ICA stenosis in the 40-59% range which is unchanged from previous. The patient return in one year for repeat carotid duplex and be seen in my clinic, his questions were encouraged and answered.  Lauree Chandler ANP   Clinic MD: Early

## 2012-03-04 NOTE — Procedures (Unsigned)
CAROTID DUPLEX EXAM  INDICATION:  Carotid stenosis.  HISTORY: Diabetes:  No. Cardiac:  No. Hypertension:  Yes. Smoking:  Previous. Previous Surgery:  Right carotid endarterectomy, 01/26/2008. CV History:  Currently asymptomatic. Amaurosis Fugax No, Paresthesias No, Hemiparesis No.                                      RIGHT             LEFT Brachial systolic pressure:         156               156 Brachial Doppler waveforms:         WNL               WNL Vertebral direction of flow:        Blunted, antegrade                  Antegrade DUPLEX VELOCITIES (cm/sec) CCA peak systolic                   90                126 - M/95 - D/143 - B ECA peak systolic                   159               186 ICA peak systolic                   101               177 ICA end diastolic                   27                30 PLAQUE MORPHOLOGY:                  Heterogenous      Calcified PLAQUE AMOUNT:                      Mild              Moderate PLAQUE LOCATION:                    CCA/ICA/ECA       CCA/ICA/ECA  IMPRESSION: 1. Right internal carotid artery is patent with a history of     endarterectomy.  No hyperplasia or hemodynamic changes evident. 2. Bilateral external carotid artery stenosis present. 3. Left common carotid artery disease present with hemodynamic     changes.  However, velocities are suggestive of <50%. 4. Left internal carotid artery stenosis present in the 40% to 59%     range.  This may be underestimated due to proximal disease and     dense calcific plaque, making Doppler interrogation difficult. 5. Right vertebral artery is blunted and antegrade. 6. Left vertebral is patent and antegrade. 7. Minimal changes present since the previous study on 02/19/2011.       ___________________________________________ Larina Earthly, M.D.  SH/MEDQ  D:  02/25/2012  T:  02/25/2012  Job:  [id number]

## 2013-01-05 ENCOUNTER — Ambulatory Visit (INDEPENDENT_AMBULATORY_CARE_PROVIDER_SITE_OTHER): Payer: Medicare Other | Admitting: Ophthalmology

## 2013-01-05 DIAGNOSIS — H35039 Hypertensive retinopathy, unspecified eye: Secondary | ICD-10-CM

## 2013-01-05 DIAGNOSIS — I10 Essential (primary) hypertension: Secondary | ICD-10-CM

## 2013-01-05 DIAGNOSIS — H3530 Unspecified macular degeneration: Secondary | ICD-10-CM

## 2013-01-05 DIAGNOSIS — H3410 Central retinal artery occlusion, unspecified eye: Secondary | ICD-10-CM

## 2013-01-05 DIAGNOSIS — H47219 Primary optic atrophy, unspecified eye: Secondary | ICD-10-CM

## 2013-01-05 DIAGNOSIS — H43819 Vitreous degeneration, unspecified eye: Secondary | ICD-10-CM

## 2013-02-23 ENCOUNTER — Other Ambulatory Visit: Payer: Self-pay | Admitting: *Deleted

## 2013-02-23 DIAGNOSIS — Z48812 Encounter for surgical aftercare following surgery on the circulatory system: Secondary | ICD-10-CM

## 2013-02-24 ENCOUNTER — Ambulatory Visit: Payer: Medicare Other | Admitting: Neurosurgery

## 2013-02-24 ENCOUNTER — Other Ambulatory Visit (INDEPENDENT_AMBULATORY_CARE_PROVIDER_SITE_OTHER): Payer: Medicare Other | Admitting: *Deleted

## 2013-02-24 DIAGNOSIS — I6529 Occlusion and stenosis of unspecified carotid artery: Secondary | ICD-10-CM

## 2013-02-24 DIAGNOSIS — Z48812 Encounter for surgical aftercare following surgery on the circulatory system: Secondary | ICD-10-CM

## 2013-02-25 ENCOUNTER — Other Ambulatory Visit: Payer: Self-pay | Admitting: *Deleted

## 2013-02-25 DIAGNOSIS — Z48812 Encounter for surgical aftercare following surgery on the circulatory system: Secondary | ICD-10-CM

## 2013-03-02 ENCOUNTER — Encounter: Payer: Self-pay | Admitting: Vascular Surgery

## 2013-03-04 ENCOUNTER — Encounter: Payer: Self-pay | Admitting: Internal Medicine

## 2013-03-18 ENCOUNTER — Encounter: Payer: Self-pay | Admitting: Internal Medicine

## 2013-03-22 ENCOUNTER — Ambulatory Visit: Payer: Medicare Other | Admitting: Gastroenterology

## 2013-11-23 ENCOUNTER — Other Ambulatory Visit: Payer: Self-pay | Admitting: Vascular Surgery

## 2013-11-23 DIAGNOSIS — I6529 Occlusion and stenosis of unspecified carotid artery: Secondary | ICD-10-CM

## 2013-11-23 DIAGNOSIS — Z48812 Encounter for surgical aftercare following surgery on the circulatory system: Secondary | ICD-10-CM

## 2014-01-02 ENCOUNTER — Emergency Department (HOSPITAL_COMMUNITY): Payer: Medicare Other

## 2014-01-02 ENCOUNTER — Emergency Department (HOSPITAL_COMMUNITY)
Admission: EM | Admit: 2014-01-02 | Discharge: 2014-01-02 | Disposition: A | Discharge status: Home / Self Care | Payer: Medicare Other | Attending: Emergency Medicine | Admitting: Emergency Medicine

## 2014-01-02 ENCOUNTER — Encounter (HOSPITAL_COMMUNITY): Payer: Self-pay | Admitting: Emergency Medicine

## 2014-01-02 DIAGNOSIS — Z79899 Other long term (current) drug therapy: Secondary | ICD-10-CM | POA: Insufficient documentation

## 2014-01-02 DIAGNOSIS — Z8673 Personal history of transient ischemic attack (TIA), and cerebral infarction without residual deficits: Secondary | ICD-10-CM | POA: Insufficient documentation

## 2014-01-02 DIAGNOSIS — Z87891 Personal history of nicotine dependence: Secondary | ICD-10-CM | POA: Insufficient documentation

## 2014-01-02 DIAGNOSIS — Z7982 Long term (current) use of aspirin: Secondary | ICD-10-CM | POA: Insufficient documentation

## 2014-01-02 DIAGNOSIS — R195 Other fecal abnormalities: Secondary | ICD-10-CM | POA: Insufficient documentation

## 2014-01-02 DIAGNOSIS — K5900 Constipation, unspecified: Secondary | ICD-10-CM | POA: Insufficient documentation

## 2014-01-02 DIAGNOSIS — Z9889 Other specified postprocedural states: Secondary | ICD-10-CM | POA: Insufficient documentation

## 2014-01-02 DIAGNOSIS — I10 Essential (primary) hypertension: Secondary | ICD-10-CM | POA: Insufficient documentation

## 2014-01-02 MED ORDER — POLYETHYLENE GLYCOL 3350 17 G PO PACK
17.0000 g | PACK | Freq: Once | ORAL | Status: AC
  Administered 2014-01-02: 17 g via ORAL
  Filled 2014-01-02: qty 1

## 2014-01-02 MED ORDER — POLYETHYLENE GLYCOL 3350 17 G PO PACK: 17.0000 g | PACK | Freq: Two times a day (BID) | ORAL | Status: DC | PRN

## 2014-01-02 MED ORDER — MAGNESIUM HYDROXIDE 400 MG/5ML PO SUSP
30.0000 mL | Freq: Once | ORAL | Status: AC
  Administered 2014-01-02: 30 mL via ORAL
  Filled 2014-01-02: qty 30

## 2014-01-02 MED ORDER — PANTOPRAZOLE SODIUM 20 MG PO TBEC: 40.0000 mg | DELAYED_RELEASE_TABLET | Freq: Every day | ORAL | Status: DC

## 2014-01-02 NOTE — ED Notes (Signed)
Constipation x 3 days with lower abd pain radiating into upper abd.  Reports last normal BM x 3 days ago.

## 2014-01-02 NOTE — Discharge Instructions (Signed)

## 2014-01-02 NOTE — ED Notes (Addendum)
Pt c/o abd pain and constipation, denies N/V, pt unable to state med he took for constipation, pt states hx of GERD

## 2014-01-02 NOTE — ED Provider Notes (Signed)
CSN: [id number]     Arrival date & time 01/02/14  1027 History   First MD Initiated Contact with Patient 01/02/14 1121     This chart was scribed for Virgel Manifold, MD by Forrestine Him, ED Scribe. This patient was seen in room APA10/APA10 and the patient's care was started 12:02 PM.   Chief Complaint  Patient presents with  . Constipation   The history is provided by the patient. No language interpreter was used.    HPI Comments: Troy Thompson is a 80 y.o. male who presents to the Emergency Department complaining of ongoing constipation x 3-4 days that is unchanged. Pt also reports associated constant lower abdominal pain that radiates into the upper abdomen. Pt describes this pain as "aggrivating". Denies any alleviating or aggravating factors. Last bowel movement 3 days ago and states his last stool was black. He reports this black stool after taking Pepto Bismol. He denies trying any other medications OTC for his constipation. Denies any previous similar episodes. No fever, chills or other associated symptoms. No other concerns this visit.   Past Medical History  Diagnosis Date  . Hypertension   . Carotid artery occlusion   . Retinal embolus, right 12/30/2007    Sudden onset of blindness   . Stroke    Past Surgical History  Procedure Laterality Date  . Joint replacement      knee  . Hernia repair    . Carotid endarterectomy  01/26/2008    Right   . Colonoscopy  03/15/2008    FUX:NATFTD rectum/Left-sided transverse diverticula/Diminutive cecal polyp status post cold biopsy  . Colonoscopy  09/24/2002    DUK:GURKYH rectum/Left-sided diverticulum/Polyp at the splenic flexure removed    No family history on file. History  Substance Use Topics  . Smoking status: Former Smoker -- 22 years    Types: Cigarettes  . Smokeless tobacco: Not on file  . Alcohol Use: No    Review of Systems  Constitutional: Negative for fever and chills.  HENT: Negative for congestion.   Eyes:  Negative for redness.  Respiratory: Negative for cough.   Gastrointestinal: Positive for abdominal pain and constipation.  Skin: Negative for rash.  Psychiatric/Behavioral: Negative for confusion.  All other systems reviewed and are negative.      Allergies  Review of patient's allergies indicates no known allergies.  Home Medications   Current Outpatient Rx  Name  Route  Sig  Dispense  Refill  . amLODipine (NORVASC) 5 MG tablet   Oral   Take 5 mg by mouth daily.         Marland Kitchen aspirin 81 MG tablet   Oral   Take 81 mg by mouth daily.         Marland Kitchen docusate sodium (COLACE) 50 MG capsule   Oral   Take 150 mg by mouth 2 (two) times daily as needed for moderate constipation.         . metoprolol tartrate (LOPRESSOR) 25 MG tablet   Oral   Take 25 mg by mouth Daily.         . Multiple Vitamins-Minerals (PRESERVISION AREDS 2 PO)   Oral   Take 1 tablet by mouth 2 (two) times daily.         Marland Kitchen omeprazole (PRILOSEC) 20 MG capsule   Oral   Take 20 mg by mouth daily.         Marland Kitchen senna (SENOKOT) 8.6 MG TABS tablet   Oral   Take 2 tablets by  mouth daily as needed for moderate constipation.         . simethicone (GAS RELIEF EXTRA STRENGTH) 125 MG chewable tablet   Oral   Chew 125 mg by mouth every 6 (six) hours as needed for flatulence.         . simvastatin (ZOCOR) 20 MG tablet   Oral   Take 20 mg by mouth Daily.         . valsartan-hydrochlorothiazide (DIOVAN-HCT) 320-25 MG per tablet   Oral   Take 1 tablet by mouth daily.          Triage Vitals: BP 175/60  Pulse 80  Temp(Src) 97.9 F (36.6 C) (Oral)  Resp 20  Ht 5\' 10"  (1.778 m)  Wt 190 lb (86.183 kg)  BMI 27.26 kg/m2  SpO2 96%   Physical Exam  Nursing note and vitals reviewed. Constitutional: He is oriented to person, place, and time. He appears well-developed and well-nourished.  HENT:  Head: Normocephalic.  Eyes: EOM are normal.  Neck: Normal range of motion.  Cardiovascular: Normal rate,  regular rhythm and normal heart sounds.   Pulmonary/Chest: Effort normal and breath sounds normal.  Abdominal: Soft. He exhibits no distension. There is no tenderness.  Musculoskeletal: Normal range of motion.  Neurological: He is alert and oriented to person, place, and time.  Psychiatric: He has a normal mood and affect.    ED Course  Procedures (including critical care time)  DIAGNOSTIC STUDIES: Oxygen Saturation is 96% on RA, Normal by my interpretation.    COORDINATION OF CARE: 12:06 PM- Will give milk of magnesia and miralax. Will order DG abd 1 view. Discussed treatment plan with pt at bedside and pt agreed to plan.     Labs Review Labs Reviewed - No data to display Imaging Review No results found.  Dg Abd 1 View  01/02/2014   CLINICAL DATA:  Constipation and abdominal pain.  EXAM: ABDOMEN - 1 VIEW  COMPARISON:  None.  FINDINGS: There is gaseous distention of the colon involving the proximal and transverse colon. Maximal diameter is at the level of the cecum which measures 9 cm in greatest diameter. No small bowel dilatation is identified. No abnormal calcifications are seen. Advanced degenerative changes are present of the lumbar spine with associated scoliosis.  IMPRESSION: Colonic ileus with the cecum demonstrating maximal diameter of 9 cm.   Electronically Signed   By: Aletta Edouard M.D.   On: 01/02/2014 12:35    EKG Interpretation None      MDM   Final diagnoses:  Constipation    88yM with abdominal cramping/bloating. No tenderness on exam. Bowel regimen. Return precautions discussed.   I personally preformed the services scribed in my presence. The recorded information has been reviewed is accurate. Virgel Manifold, MD.   Virgel Manifold, MD 01/06/14 1434

## 2014-01-06 ENCOUNTER — Ambulatory Visit (INDEPENDENT_AMBULATORY_CARE_PROVIDER_SITE_OTHER): Payer: Medicare Other | Admitting: Ophthalmology

## 2014-01-06 DIAGNOSIS — H3530 Unspecified macular degeneration: Secondary | ICD-10-CM

## 2014-01-06 DIAGNOSIS — H35379 Puckering of macula, unspecified eye: Secondary | ICD-10-CM

## 2014-01-06 DIAGNOSIS — H47219 Primary optic atrophy, unspecified eye: Secondary | ICD-10-CM

## 2014-01-06 DIAGNOSIS — I10 Essential (primary) hypertension: Secondary | ICD-10-CM

## 2014-01-06 DIAGNOSIS — H43819 Vitreous degeneration, unspecified eye: Secondary | ICD-10-CM

## 2014-01-06 DIAGNOSIS — H3410 Central retinal artery occlusion, unspecified eye: Secondary | ICD-10-CM

## 2014-01-06 DIAGNOSIS — H35039 Hypertensive retinopathy, unspecified eye: Secondary | ICD-10-CM

## 2014-01-11 ENCOUNTER — Other Ambulatory Visit (HOSPITAL_COMMUNITY): Payer: Self-pay | Admitting: Internal Medicine

## 2014-01-11 DIAGNOSIS — R194 Change in bowel habit: Secondary | ICD-10-CM

## 2014-01-11 DIAGNOSIS — K567 Ileus, unspecified: Secondary | ICD-10-CM

## 2014-01-11 DIAGNOSIS — R109 Unspecified abdominal pain: Secondary | ICD-10-CM

## 2014-01-12 ENCOUNTER — Ambulatory Visit (HOSPITAL_COMMUNITY)
Admission: RE | Admit: 2014-01-12 | Discharge: 2014-01-12 | Disposition: A | Discharge status: Home / Self Care | Payer: Medicare Other | Source: Ambulatory Visit | Attending: Internal Medicine | Admitting: Internal Medicine

## 2014-01-12 DIAGNOSIS — R109 Unspecified abdominal pain: Secondary | ICD-10-CM | POA: Insufficient documentation

## 2014-01-12 DIAGNOSIS — K560 Paralytic ileus: Secondary | ICD-10-CM | POA: Insufficient documentation

## 2014-01-12 DIAGNOSIS — K567 Ileus, unspecified: Secondary | ICD-10-CM

## 2014-01-12 DIAGNOSIS — R194 Change in bowel habit: Secondary | ICD-10-CM

## 2014-01-12 MED ORDER — IOHEXOL 300 MG/ML  SOLN
100.0000 mL | Freq: Once | INTRAMUSCULAR | Status: AC | PRN
  Administered 2014-01-12: 100 mL via INTRAVENOUS

## 2014-01-13 ENCOUNTER — Encounter (HOSPITAL_COMMUNITY): Payer: Self-pay | Admitting: Pharmacy Technician

## 2014-01-13 NOTE — H&P (Signed)
  NTS SOAP Note  Vital Signs:  Vitals as of: 06/09/2354: Systolic 732: Diastolic 64: Heart Rate 69: Temp 98.28F: Height 16ft 10in: Weight 194Lbs 0 Ounces: Pain Level 6: BMI 27.84  BMI : 27.84 kg/m2  Subjective: This 80 Years 40 Months old Male presents for of abdominal pain.  Has been having intermittent right upper quadrant abdominal pain over the past few weeks.  Seen in ER, CT scan showed cholecystitis, cholelithiasis.  LFT's wnl.  Slight elevation in WBC.  Currently feels ok, just sore.  No chills, jaundice.  Review of Symptoms:  Constitutional:  fatigue Head:unremarkable    :  blind in right eye Nose/Mouth/Throat:unremarkable Cardiovascular:  unremarkable   Respiratory:  dyspnea Gastrointestin    abdominal pain,heartburn Genitourinary:    frequency   joint pain Skin:unremarkable Hematolgic/Lymphatic:unremarkable     Allergic/Immunologic:unremarkable     Past Medical History:    Reviewed  Past Medical History  Surgical History: knee surgery Medical Problems: HTN, high cholesterol level Allergies: nkda Medications: valsartin/HCTZ, pantoprazole, simvastatin, baby asa, metoprolol, amlodipine   Social History:Reviewed  Social History  Preferred Language: English Race:  White Ethnicity: Not Hispanic / Latino Age: 80 Years 6 Months Marital Status:  M Alcohol: no   Smoking Status: Never smoker reviewed on 01/13/2014 Functional Status reviewed on 01/13/2014 ------------------------------------------------ Bathing: Normal Cooking: Normal Dressing: Normal Driving: Normal Eating: Normal Managing Meds: Normal Oral Care: Normal Shopping: Normal Toileting: Normal Transferring: Normal Walking: Normal Cognitive Status reviewed on 01/13/2014 ------------------------------------------------ Attention: Normal Decision Making: Normal Language: Normal Memory: Normal Motor: Normal Perception: Normal Problem Solving:  Normal Visual and Spatial: Normal   Family History:  Reviewed  Family Health History Mother, Deceased; Heart disease;  Father, Deceased; Cancer unspecified;     Objective Information: General:  Well appearing, well nourished in no distress.   no scleral icterus Heart:  RRR, no murmur or gallop.  Normal S1, S2.  No S3, S4.  Lungs:    CTA bilaterally, no wheezes, rhonchi, rales.  Breathing unlabored. Abdomen:Soft, tender in right upper quadrant to deep palpation, ND, normal bowel sounds, no HSM, no masses.  No peritoneal signs.  Assessment:Cholecystitis, cholelithiasis  Diagnoses: 574.00 Calculus of gallbladder with acute cholecystitis (Calculus of gallbladder with acute cholecystitis without obstruction)  Procedures: 20254 - OFFICE OUTPATIENT NEW 66 MINUTES    Plan:Scheduled for laparoscopic cholecystectomy on 01/17/14.  Will start cipro 400mg  po bid.   Patient Education:Alternative treatments to surgery were discussed with patient (and family).  Risks and benefits  of procedure including bleeding, infection, hepatobiliary injury, and the possibility of an open procedure were fully explained to the patient (and family) who gave informed consent. Patient/family questions were addressed.  Follow-up:Pending Surgery

## 2014-01-14 ENCOUNTER — Other Ambulatory Visit: Payer: Self-pay

## 2014-01-14 ENCOUNTER — Encounter (HOSPITAL_COMMUNITY): Payer: Self-pay | Admitting: Emergency Medicine

## 2014-01-14 ENCOUNTER — Emergency Department (HOSPITAL_COMMUNITY)
Admission: EM | Admit: 2014-01-14 | Discharge: 2014-01-14 | Disposition: A | Discharge status: Home / Self Care | Payer: Medicare Other | Attending: Emergency Medicine | Admitting: Emergency Medicine

## 2014-01-14 ENCOUNTER — Encounter (HOSPITAL_COMMUNITY)
Admission: RE | Admit: 2014-01-14 | Discharge: 2014-01-14 | Disposition: A | Discharge status: Home / Self Care | Payer: Medicare Other | Source: Ambulatory Visit | Attending: General Surgery | Admitting: General Surgery

## 2014-01-14 DIAGNOSIS — I10 Essential (primary) hypertension: Secondary | ICD-10-CM | POA: Insufficient documentation

## 2014-01-14 DIAGNOSIS — R61 Generalized hyperhidrosis: Secondary | ICD-10-CM | POA: Insufficient documentation

## 2014-01-14 DIAGNOSIS — I499 Cardiac arrhythmia, unspecified: Secondary | ICD-10-CM | POA: Insufficient documentation

## 2014-01-14 DIAGNOSIS — Z7982 Long term (current) use of aspirin: Secondary | ICD-10-CM | POA: Insufficient documentation

## 2014-01-14 DIAGNOSIS — G479 Sleep disorder, unspecified: Secondary | ICD-10-CM | POA: Insufficient documentation

## 2014-01-14 DIAGNOSIS — Z87891 Personal history of nicotine dependence: Secondary | ICD-10-CM | POA: Insufficient documentation

## 2014-01-14 DIAGNOSIS — Z79899 Other long term (current) drug therapy: Secondary | ICD-10-CM | POA: Insufficient documentation

## 2014-01-14 DIAGNOSIS — Z8669 Personal history of other diseases of the nervous system and sense organs: Secondary | ICD-10-CM | POA: Insufficient documentation

## 2014-01-14 DIAGNOSIS — Z8673 Personal history of transient ischemic attack (TIA), and cerebral infarction without residual deficits: Secondary | ICD-10-CM | POA: Insufficient documentation

## 2014-01-14 DIAGNOSIS — R42 Dizziness and giddiness: Secondary | ICD-10-CM | POA: Insufficient documentation

## 2014-01-14 DIAGNOSIS — K219 Gastro-esophageal reflux disease without esophagitis: Secondary | ICD-10-CM | POA: Insufficient documentation

## 2014-01-14 LAB — CBC WITH DIFFERENTIAL/PLATELET
Basophils Absolute: 0 10*3/uL (ref 0.0–0.1)
Basophils Relative: 0 % (ref 0–1)
Eosinophils Absolute: 0.2 10*3/uL (ref 0.0–0.7)
Eosinophils Relative: 2 % (ref 0–5)
HCT: 35.2 % — ABNORMAL LOW (ref 39.0–52.0)
Hemoglobin: 11.7 g/dL — ABNORMAL LOW (ref 13.0–17.0)
Lymphocytes Relative: 9 % — ABNORMAL LOW (ref 12–46)
Lymphs Abs: 1 10*3/uL (ref 0.7–4.0)
MCH: 30.2 pg (ref 26.0–34.0)
MCHC: 33.2 g/dL (ref 30.0–36.0)
MCV: 91 fL (ref 78.0–100.0)
Monocytes Absolute: 0.7 10*3/uL (ref 0.1–1.0)
Monocytes Relative: 6 % (ref 3–12)
Neutro Abs: 8.8 10*3/uL — ABNORMAL HIGH (ref 1.7–7.7)
Neutrophils Relative %: 83 % — ABNORMAL HIGH (ref 43–77)
Platelets: 272 10*3/uL (ref 150–400)
RBC: 3.87 MIL/uL — ABNORMAL LOW (ref 4.22–5.81)
RDW: 13.6 % (ref 11.5–15.5)
WBC: 10.7 10*3/uL — ABNORMAL HIGH (ref 4.0–10.5)

## 2014-01-14 LAB — BASIC METABOLIC PANEL
BUN: 27 mg/dL — ABNORMAL HIGH (ref 6–23)
CO2: 26 mEq/L (ref 19–32)
Calcium: 9.2 mg/dL (ref 8.4–10.5)
Chloride: 101 mEq/L (ref 96–112)
Creatinine, Ser: 1.51 mg/dL — ABNORMAL HIGH (ref 0.50–1.35)
GFR calc Af Amer: 46 mL/min — ABNORMAL LOW (ref >90)
GFR calc non Af Amer: 40 mL/min — ABNORMAL LOW (ref >90)
Glucose, Bld: 109 mg/dL — ABNORMAL HIGH (ref 70–99)
Potassium: 5 mEq/L (ref 3.7–5.3)
Sodium: 140 mEq/L (ref 137–147)

## 2014-01-14 LAB — TROPONIN I: Troponin I: <0.3 ng/mL (ref <0.30)

## 2014-01-14 NOTE — ED Provider Notes (Signed)
CSN: [id number]     Arrival date & time 01/14/14  1518 History  This chart was scribed for Virgel Manifold, MD by Delphia Grates, ED Scribe. This patient was seen in room APA18/APA18 and the patient's care was started at 3:45 PM.    Chief Complaint  Patient presents with  . Irregular Heart Beat     The history is provided by the patient. No language interpreter was used.  HPI Comments: Troy Thompson is a 80 y.o. male who presents to the Emergency Department complaining of irregular heart beat. He was sent here during a pre-op visit today due to an abnormal EKG. Pt reports diaphoresis, trouble sleeping, and symptoms of GERD. He reports dizziness but states this is baseline for him. Pt denies any physical symptoms such as CP, palpitations, SOB. Pt is scheduled to have a cholecystectomy in 3 days. He denies change in medications. He also denies history of cardiac disease.  PCP [place]. No cardiologist.  Past Medical History  Diagnosis Date  . Hypertension   . Carotid artery occlusion   . Retinal embolus, right 12/30/2007    Sudden onset of blindness   . Stroke    Past Surgical History  Procedure Laterality Date  . Joint replacement      knee  . Hernia repair    . Carotid endarterectomy  01/26/2008    Right   . Colonoscopy  03/15/2008    NGE:XBMWUX rectum/Left-sided transverse diverticula/Diminutive cecal polyp status post cold biopsy  . Colonoscopy  09/24/2002    LKG:MWNUUV rectum/Left-sided diverticulum/Polyp at the splenic flexure removed    History reviewed. No pertinent family history. History  Substance Use Topics  . Smoking status: Former Smoker -- 22 years    Types: Cigarettes  . Smokeless tobacco: Not on file  . Alcohol Use: No    Review of Systems  Constitutional: Positive for diaphoresis.  Respiratory: Negative for shortness of breath.   Cardiovascular: Negative for chest pain and palpitations.  Neurological: Negative for dizziness and light-headedness.       Allergies  Review of patient's allergies indicates no known allergies.  Home Medications   Current Outpatient Rx  Name  Route  Sig  Dispense  Refill  . amLODipine (NORVASC) 5 MG tablet   Oral   Take 5 mg by mouth daily.         Marland Kitchen aspirin 81 MG tablet   Oral   Take 81 mg by mouth daily.         Marland Kitchen docusate sodium (COLACE) 50 MG capsule   Oral   Take 150 mg by mouth 2 (two) times daily as needed for moderate constipation.         . metoprolol tartrate (LOPRESSOR) 25 MG tablet   Oral   Take 25 mg by mouth Daily.         . Multiple Vitamins-Minerals (PRESERVISION AREDS 2 PO)   Oral   Take 1 tablet by mouth 2 (two) times daily.         Marland Kitchen omeprazole (PRILOSEC) 20 MG capsule   Oral   Take 20 mg by mouth daily.         . pantoprazole (PROTONIX) 20 MG tablet   Oral   Take 2 tablets (40 mg total) by mouth daily.   60 tablet   0   . polyethylene glycol (MIRALAX / GLYCOLAX) packet   Oral   Take 17 g by mouth 2 (two) times daily as needed.   14 each  0   . senna (SENOKOT) 8.6 MG TABS tablet   Oral   Take 2 tablets by mouth daily as needed for moderate constipation.         . simethicone (GAS RELIEF EXTRA STRENGTH) 125 MG chewable tablet   Oral   Chew 125 mg by mouth every 6 (six) hours as needed for flatulence.         . simvastatin (ZOCOR) 20 MG tablet   Oral   Take 20 mg by mouth Daily.         . valsartan-hydrochlorothiazide (DIOVAN-HCT) 320-25 MG per tablet   Oral   Take 1 tablet by mouth daily.          Temp(Src) 97.9 F (36.6 C) (Oral)  Ht 5\' 10"  (1.778 m)  Wt 194 lb (87.998 kg)  BMI 27.84 kg/m2 Physical Exam  Nursing note and vitals reviewed. Constitutional: He is oriented to person, place, and time. He appears well-developed and well-nourished.  HENT:  Head: Normocephalic and atraumatic.  Eyes: Conjunctivae and EOM are normal. Pupils are equal, round, and reactive to light.  Neck: Normal range of motion. Neck supple.   Cardiovascular: Normal rate, regular rhythm and normal heart sounds.   Frequent PVCs.  Pulmonary/Chest: Effort normal and breath sounds normal.  Abdominal: Soft. Bowel sounds are normal.  Musculoskeletal: Normal range of motion.  Neurological: He is alert and oriented to person, place, and time.  Skin: Skin is warm and dry.  Psychiatric: He has a normal mood and affect. His behavior is normal.    ED Course  Procedures (including critical care time) DIAGNOSTIC STUDIES: Oxygen Saturation is 96% on RA, adequate by my interpretation.    COORDINATION OF CARE: 3:55 PM- Will order lab work.. Pt advised of plan for treatment and pt agrees.    Labs Review Labs Reviewed  CBC WITH DIFFERENTIAL - Abnormal; Notable for the following:    WBC 10.7 (*)    RBC 3.87 (*)    Hemoglobin 11.7 (*)    HCT 35.2 (*)    Neutrophils Relative % 83 (*)    Neutro Abs 8.8 (*)    Lymphocytes Relative 9 (*)    All other components within normal limits  BASIC METABOLIC PANEL - Abnormal; Notable for the following:    Glucose, Bld 109 (*)    BUN 27 (*)    Creatinine, Ser 1.51 (*)    GFR calc non Af Amer 40 (*)    GFR calc Af Amer 46 (*)    All other components within normal limits  TROPONIN I   Imaging Review No results found.   EKG Interpretation None      EKG:  Rhythm: sinus, ventricular bigeminy Vent. rate 80 BPM QRS duration 140 ms QT/QTc 378/435 ms ST segments: NSST   MDM   Final diagnoses:  Irregular heart beat    80 year old male sent here for evaluation after having abnormal preoperative EKG. Patient with sinus rhythm alternating with intermittent ventricular bigeminy. He has no complaints. Is asymptomatic. Electrolytes and troponin normal. From my perspective he has been medically cleared from emergency medicine perspective.  I personally preformed the services scribed in my presence. The recorded information has been reviewed is accurate. Virgel Manifold, MD.       Virgel Manifold, MD 01/20/14 [phone]

## 2014-01-14 NOTE — Pre-Procedure Instructions (Addendum)
Patient in for PAT. Heart rate on dinamapp 39 initially. Then ranges from 39-79. Listen apically and heatrbeat irregular. Radial pulse thready. BP-155/58.  EKG done and shows Bigemeny- hr of 80. Patient states he has been weak and dizzy at times but denies CP or SOB. DR Patsey Berthold and Arnoldo Morale shown EKG and patient will be sent to ED for further evaluation. Patient and family verbalize understanding of this. Patient to ED via W/C. Spoke with Designer, jewellery in triage. Patient taken straight to room 18 in ED. Dr Arnoldo Morale has made Dr. Willey Blade aware that patient is here.

## 2014-01-14 NOTE — ED Notes (Signed)
Pt was at short stay for PAT, pt's HR changed from 33-72 bpm, EKG shows bigeminy, pt states last few nights having night sweats. Pt denies chest pain. Pt denies SOB, N/V.

## 2014-01-14 NOTE — ED Notes (Signed)
Discharge instructions reviewed with pt, questions answered. Pt verbalized understanding.  

## 2014-01-14 NOTE — Patient Instructions (Addendum)
Troy Thompson  01/14/2014   Your procedure is scheduled on: 01/17/2014  Report to [facility] at  59  AM.  Call this number if you have problems the morning of surgery: [phone]   Remember:   Do not eat food or drink liquids after midnight.   Take these medicines the morning of surgery with A SIP OF WATER:  Amlodipine, metoprolol, prilosec, protonix, valsartan   Do not wear jewelry, make-up or nail polish.  Do not wear lotions, powders, or perfumes.   Do not shave 48 hours prior to surgery. Men may shave face and neck.  Do not bring valuables to the hospital.  [facility] is not responsibe for any belongings or valuables.               Contacts, dentures or bridgework may not be worn into surgery.  Leave suitcase in the car. After surgery it may be brought to your room.  For patients admitted to the hospital, discharge time is determined by your treatment team.               Patients discharged the day of surgery will not be allowed to drive home.  Name and phone number of your driver: family  Special Instructions: Shower using CHG 2 nights before surgery and the night before surgery.  If you shower the day of surgery use CHG.  Use special wash - you have one bottle of CHG for all showers.  You should use approximately 1/3 of the bottle for each shower.   Please read over the following fact sheets that you were given: Pain Booklet, Coughing and Deep Breathing, Surgical Site Infection Prevention, Anesthesia Post-op Instructions and Care and Recovery After Surgery Laparoscopic Cholecystectomy Laparoscopic cholecystectomy is surgery to remove the gallbladder. The gallbladder is located in the upper right part of the abdomen, behind the liver. It is a storage sac for bile produced in the liver. Bile aids in the digestion and absorption of fats. Cholecystectomy is often done for inflammation of the gallbladder (cholecystitis). This condition is usually caused by a buildup of  gallstones (cholelithiasis) in your gallbladder. Gallstones can block the flow of bile, resulting in inflammation and pain. In severe cases, emergency surgery may be required. When emergency surgery is not required, you will have time to prepare for the procedure. Laparoscopic surgery is an alternative to open surgery. Laparoscopic surgery has a shorter recovery time. Your common bile duct may also need to be examined during the procedure. If stones are found in the common bile duct, they may be removed. LET [facility] CARE PROVIDER KNOW ABOUT:  Any allergies you have.  All medicines you are taking, including vitamins, herbs, eye drops, creams, and over-the-counter medicines.  Previous problems you or members of your family have had with the use of anesthetics.  Any blood disorders you have.  Previous surgeries you have had.  Medical conditions you have. RISKS AND COMPLICATIONS Generally, this is a safe procedure. However, as with any procedure, complications can occur. Possible complications include:  Infection.  Damage to the common bile duct, nerves, arteries, veins, or other internal organs such as the stomach, liver, or intestines.  Bleeding.  A stone may remain in the common bile duct.  A bile leak from the cyst duct that is clipped when your gallbladder is removed.  The need to convert to open surgery, which requires a larger incision in the abdomen. This may be necessary if  your surgeon thinks it is not safe to continue with a laparoscopic procedure. BEFORE THE PROCEDURE  Ask your health care provider about changing or stopping any regular medicines. You will need to stop taking aspirin or blood thinners at least 5 days prior to surgery.  Do not eat or drink anything after midnight the night before surgery.  Let your health care provider know if you develop a cold or other infectious problem before surgery. PROCEDURE   You will be given medicine to make you sleep  through the procedure (general anesthetic). A breathing tube will be placed in your mouth.  When you are asleep, your surgeon will make several small cuts (incisions) in your abdomen.  A thin, lighted tube with a tiny camera on the end (laparoscope) is inserted through one of the small incisions. The camera on the laparoscope sends a picture to a TV screen in the operating room. This gives the surgeon a good view inside your abdomen.  A gas will be pumped into your abdomen. This expands your abdomen so that the surgeon has more room to perform the surgery.  Other tools needed for the procedure are inserted through the other incisions. The gallbladder is removed through one of the incisions.  After the removal of your gallbladder, the incisions will be closed with stitches, staples, or skin glue. AFTER THE PROCEDURE  You will be taken to a recovery area where your progress will be checked often.  You may be allowed to go home the same day if your pain is controlled and you can tolerate liquids. Document Released: 09/23/2005 Document Revised: 07/14/2013 Document Reviewed: 05/05/2013 [facility] Patient Information 2014 [place]. PATIENT INSTRUCTIONS POST-ANESTHESIA  IMMEDIATELY FOLLOWING SURGERY:  Do not drive or operate machinery for the first twenty four hours after surgery.  Do not make any important decisions for twenty four hours after surgery or while taking narcotic pain medications or sedatives.  If you develop intractable nausea and vomiting or a severe headache please notify your doctor immediately.  FOLLOW-UP:  Please make an appointment with your surgeon as instructed. You do not need to follow up with anesthesia unless specifically instructed to do so.  WOUND CARE INSTRUCTIONS (if applicable):  Keep a dry clean dressing on the anesthesia/puncture wound site if there is drainage.  Once the wound has quit draining you may leave it open to air.  Generally you should leave the  bandage intact for twenty four hours unless there is drainage.  If the epidural site drains for more than 36-48 hours please call the anesthesia department.  QUESTIONS?:  Please feel free to call your physician or the hospital operator if you have any questions, and they will be happy to assist you.

## 2014-01-17 ENCOUNTER — Ambulatory Visit (HOSPITAL_COMMUNITY): Payer: Medicare Other | Admitting: Anesthesiology

## 2014-01-17 ENCOUNTER — Encounter (HOSPITAL_COMMUNITY)
Admission: RE | Disposition: A | Discharge status: Home / Self Care | Payer: Self-pay | Source: Ambulatory Visit | Attending: General Surgery

## 2014-01-17 ENCOUNTER — Ambulatory Visit (HOSPITAL_COMMUNITY)
Admission: RE | Admit: 2014-01-17 | Discharge: 2014-01-17 | Disposition: A | Discharge status: Home / Self Care | Payer: Medicare Other | Source: Ambulatory Visit | Attending: General Surgery | Admitting: General Surgery

## 2014-01-17 ENCOUNTER — Encounter (HOSPITAL_COMMUNITY): Payer: Medicare Other | Admitting: Anesthesiology

## 2014-01-17 ENCOUNTER — Encounter (HOSPITAL_COMMUNITY): Payer: Self-pay | Admitting: *Deleted

## 2014-01-17 DIAGNOSIS — K219 Gastro-esophageal reflux disease without esophagitis: Secondary | ICD-10-CM | POA: Insufficient documentation

## 2014-01-17 DIAGNOSIS — I10 Essential (primary) hypertension: Secondary | ICD-10-CM | POA: Insufficient documentation

## 2014-01-17 DIAGNOSIS — Z7982 Long term (current) use of aspirin: Secondary | ICD-10-CM | POA: Insufficient documentation

## 2014-01-17 DIAGNOSIS — E7800 Pure hypercholesterolemia, unspecified: Secondary | ICD-10-CM | POA: Insufficient documentation

## 2014-01-17 DIAGNOSIS — K8010 Calculus of gallbladder with chronic cholecystitis without obstruction: Secondary | ICD-10-CM | POA: Insufficient documentation

## 2014-01-17 DIAGNOSIS — Z79899 Other long term (current) drug therapy: Secondary | ICD-10-CM | POA: Insufficient documentation

## 2014-01-17 DIAGNOSIS — Z87891 Personal history of nicotine dependence: Secondary | ICD-10-CM | POA: Insufficient documentation

## 2014-01-17 HISTORY — PX: CHOLECYSTECTOMY: SHX55

## 2014-01-17 SURGERY — LAPAROSCOPIC CHOLECYSTECTOMY
Anesthesia: General | Site: Abdomen

## 2014-01-17 MED ORDER — KETOROLAC TROMETHAMINE 30 MG/ML IJ SOLN
30.0000 mg | Freq: Once | INTRAMUSCULAR | Status: AC
  Administered 2014-01-17: 30 mg via INTRAVENOUS
  Filled 2014-01-17: qty 1

## 2014-01-17 MED ORDER — ROCURONIUM BROMIDE 50 MG/5ML IV SOLN
INTRAVENOUS | Status: AC
  Filled 2014-01-17: qty 1

## 2014-01-17 MED ORDER — MIDAZOLAM HCL 2 MG/2ML IJ SOLN
1.0000 mg | INTRAMUSCULAR | Status: DC | PRN
  Administered 2014-01-17: 2 mg via INTRAVENOUS

## 2014-01-17 MED ORDER — ONDANSETRON HCL 4 MG/2ML IJ SOLN
4.0000 mg | Freq: Once | INTRAMUSCULAR | Status: AC
  Administered 2014-01-17: 4 mg via INTRAVENOUS

## 2014-01-17 MED ORDER — LACTATED RINGERS IV SOLN
INTRAVENOUS | Status: DC
  Administered 2014-01-17: 10:00:00 via INTRAVENOUS

## 2014-01-17 MED ORDER — NEOSTIGMINE METHYLSULFATE 1 MG/ML IJ SOLN
INTRAMUSCULAR | Status: DC | PRN
  Administered 2014-01-17: 2 mg via INTRAVENOUS

## 2014-01-17 MED ORDER — FENTANYL CITRATE 0.05 MG/ML IJ SOLN
INTRAMUSCULAR | Status: AC
  Filled 2014-01-17: qty 5

## 2014-01-17 MED ORDER — PROPOFOL 10 MG/ML IV BOLUS
INTRAVENOUS | Status: DC | PRN
  Administered 2014-01-17: 130 mg via INTRAVENOUS
  Administered 2014-01-17: 20 mg via INTRAVENOUS

## 2014-01-17 MED ORDER — LACTATED RINGERS IV SOLN: INTRAVENOUS | Status: DC

## 2014-01-17 MED ORDER — LIDOCAINE HCL (CARDIAC) 10 MG/ML IV SOLN
INTRAVENOUS | Status: DC | PRN
  Administered 2014-01-17: 10 mg via INTRAVENOUS

## 2014-01-17 MED ORDER — 0.9 % SODIUM CHLORIDE (POUR BTL) OPTIME
TOPICAL | Status: DC | PRN
  Administered 2014-01-17: 1000 mL

## 2014-01-17 MED ORDER — FENTANYL CITRATE 0.05 MG/ML IJ SOLN
25.0000 ug | INTRAMUSCULAR | Status: DC | PRN
Start: 2014-01-17 — End: 2014-01-18

## 2014-01-17 MED ORDER — LIDOCAINE HCL (PF) 1 % IJ SOLN
INTRAMUSCULAR | Status: AC
  Filled 2014-01-17: qty 5

## 2014-01-17 MED ORDER — FENTANYL CITRATE 0.05 MG/ML IJ SOLN
INTRAMUSCULAR | Status: DC | PRN
  Administered 2014-01-17: 25 ug via INTRAVENOUS
  Administered 2014-01-17: 50 ug via INTRAVENOUS
  Administered 2014-01-17: 25 ug via INTRAVENOUS
  Administered 2014-01-17: 50 ug via INTRAVENOUS

## 2014-01-17 MED ORDER — ONDANSETRON HCL 4 MG/2ML IJ SOLN: 4.0000 mg | Freq: Once | INTRAMUSCULAR | Status: DC | PRN

## 2014-01-17 MED ORDER — METOPROLOL TARTRATE 1 MG/ML IV SOLN
INTRAVENOUS | Status: AC
  Filled 2014-01-17: qty 5

## 2014-01-17 MED ORDER — ROCURONIUM BROMIDE 100 MG/10ML IV SOLN
INTRAVENOUS | Status: DC | PRN
  Administered 2014-01-17 (×2): 5 mg via INTRAVENOUS
  Administered 2014-01-17: 30 mg via INTRAVENOUS
  Administered 2014-01-17: 5 mg via INTRAVENOUS

## 2014-01-17 MED ORDER — GLYCOPYRROLATE 0.2 MG/ML IJ SOLN
INTRAMUSCULAR | Status: DC | PRN
  Administered 2014-01-17: 0.4 mg via INTRAVENOUS

## 2014-01-17 MED ORDER — DEXAMETHASONE SODIUM PHOSPHATE 4 MG/ML IJ SOLN
4.0000 mg | Freq: Once | INTRAMUSCULAR | Status: AC
  Administered 2014-01-17: 4 mg via INTRAVENOUS

## 2014-01-17 MED ORDER — MIDAZOLAM HCL 2 MG/2ML IJ SOLN
INTRAMUSCULAR | Status: AC
  Filled 2014-01-17: qty 2

## 2014-01-17 MED ORDER — POVIDONE-IODINE 10 % EX OINT
TOPICAL_OINTMENT | CUTANEOUS | Status: AC
  Filled 2014-01-17: qty 1

## 2014-01-17 MED ORDER — CHLORHEXIDINE GLUCONATE 4 % EX LIQD
1.0000 "application " | Freq: Once | CUTANEOUS | Status: AC
  Administered 2014-01-17: 3 via TOPICAL

## 2014-01-17 MED ORDER — GLYCOPYRROLATE 0.2 MG/ML IJ SOLN
0.2000 mg | Freq: Once | INTRAMUSCULAR | Status: AC
  Administered 2014-01-17: 0.2 mg via INTRAVENOUS

## 2014-01-17 MED ORDER — PROPOFOL 10 MG/ML IV EMUL
INTRAVENOUS | Status: AC
Start: 2014-01-17 — End: 2014-01-17
  Filled 2014-01-17: qty 20

## 2014-01-17 MED ORDER — CIPROFLOXACIN IN D5W 400 MG/200ML IV SOLN
400.0000 mg | INTRAVENOUS | Status: AC
  Administered 2014-01-17: 400 mg via INTRAVENOUS

## 2014-01-17 MED ORDER — GLYCOPYRROLATE 0.2 MG/ML IJ SOLN
INTRAMUSCULAR | Status: AC
  Filled 2014-01-17: qty 1

## 2014-01-17 MED ORDER — CIPROFLOXACIN IN D5W 400 MG/200ML IV SOLN
INTRAVENOUS | Status: AC
  Filled 2014-01-17: qty 200

## 2014-01-17 MED ORDER — DEXAMETHASONE SODIUM PHOSPHATE 4 MG/ML IJ SOLN
INTRAMUSCULAR | Status: AC
  Filled 2014-01-17: qty 1

## 2014-01-17 MED ORDER — HYDROCODONE-ACETAMINOPHEN 5-325 MG PO TABS: 1.0000 | ORAL_TABLET | ORAL | Status: DC | PRN

## 2014-01-17 MED ORDER — GLYCOPYRROLATE 0.2 MG/ML IJ SOLN
INTRAMUSCULAR | Status: AC
  Filled 2014-01-17: qty 2

## 2014-01-17 MED ORDER — SODIUM CHLORIDE 0.9 % IR SOLN
Status: DC | PRN
  Administered 2014-01-17: 1000 mL

## 2014-01-17 MED ORDER — ONDANSETRON HCL 4 MG/2ML IJ SOLN
INTRAMUSCULAR | Status: AC
  Filled 2014-01-17: qty 2

## 2014-01-17 MED ORDER — BUPIVACAINE HCL (PF) 0.5 % IJ SOLN
INTRAMUSCULAR | Status: AC
  Filled 2014-01-17: qty 30

## 2014-01-17 SURGICAL SUPPLY — 47 items
APPLIER CLIP LAPSCP 10X32 DD (CLIP) ×2 IMPLANT
BAG HAMPER (MISCELLANEOUS) ×2 IMPLANT
CLOTH BEACON ORANGE TIMEOUT ST (SAFETY) ×2 IMPLANT
COVER LIGHT HANDLE STERIS (MISCELLANEOUS) ×4 IMPLANT
CUTTER LINEAR ENDO 35 ETS TH (STAPLE) ×2 IMPLANT
DECANTER SPIKE VIAL GLASS SM (MISCELLANEOUS) ×2 IMPLANT
DURAPREP 26ML APPLICATOR (WOUND CARE) ×2 IMPLANT
ELECT REM PT RETURN 9FT ADLT (ELECTROSURGICAL) ×2
ELECTRODE REM PT RTRN 9FT ADLT (ELECTROSURGICAL) ×1 IMPLANT
FILTER SMOKE EVAC LAPAROSHD (FILTER) ×2 IMPLANT
FORMALIN 10 PREFIL 120ML (MISCELLANEOUS) ×2 IMPLANT
GLOVE BIO SURGEON STRL SZ7.5 (GLOVE) ×2 IMPLANT
GLOVE BIOGEL PI IND STRL 7.0 (GLOVE) ×2 IMPLANT
GLOVE BIOGEL PI IND STRL 7.5 (GLOVE) ×1 IMPLANT
GLOVE BIOGEL PI IND STRL 8 (GLOVE) ×1 IMPLANT
GLOVE BIOGEL PI INDICATOR 7.0 (GLOVE) ×2
GLOVE BIOGEL PI INDICATOR 7.5 (GLOVE) ×1
GLOVE BIOGEL PI INDICATOR 8 (GLOVE) ×1
GLOVE ECLIPSE 6.5 STRL STRAW (GLOVE) ×2 IMPLANT
GLOVE ECLIPSE 7.0 STRL STRAW (GLOVE) ×2 IMPLANT
GLOVE EXAM NITRILE MD LF STRL (GLOVE) ×2 IMPLANT
GLOVE OPTIFIT SS 6.5 STRL BRWN (GLOVE) ×2 IMPLANT
GOWN STRL REUS W/TWL LRG LVL3 (GOWN DISPOSABLE) ×6 IMPLANT
HEMOSTAT SNOW SURGICEL 2X4 (HEMOSTASIS) ×2 IMPLANT
INST SET LAPROSCOPIC AP (KITS) ×2 IMPLANT
IV NS IRRIG 3000ML ARTHROMATIC (IV SOLUTION) ×2 IMPLANT
KIT ROOM TURNOVER APOR (KITS) ×2 IMPLANT
MANIFOLD NEPTUNE II (INSTRUMENTS) ×2 IMPLANT
NEEDLE INSUFFLATION 14GA 120MM (NEEDLE) ×2 IMPLANT
NS IRRIG 1000ML POUR BTL (IV SOLUTION) ×2 IMPLANT
PACK LAP CHOLE LZT030E (CUSTOM PROCEDURE TRAY) ×2 IMPLANT
PAD ARMBOARD 7.5X6 YLW CONV (MISCELLANEOUS) ×2 IMPLANT
PENCIL HANDSWITCHING (ELECTRODE) ×2 IMPLANT
POUCH SPECIMEN RETRIEVAL 10MM (ENDOMECHANICALS) ×2 IMPLANT
SET BASIN LINEN APH (SET/KITS/TRAYS/PACK) ×2 IMPLANT
SET TUBE IRRIG SUCTION NO TIP (IRRIGATION / IRRIGATOR) ×2 IMPLANT
SLEEVE ENDOPATH XCEL 5M (ENDOMECHANICALS) ×2 IMPLANT
SPONGE GAUZE 2X2 8PLY STRL LF (GAUZE/BANDAGES/DRESSINGS) ×2 IMPLANT
STAPLER VISISTAT (STAPLE) ×2 IMPLANT
SUT VICRYL 0 UR6 27IN ABS (SUTURE) ×6 IMPLANT
TAPE CLOTH SURG 4X10 WHT LF (GAUZE/BANDAGES/DRESSINGS) ×2 IMPLANT
TROCAR ENDO BLADELESS 11MM (ENDOMECHANICALS) ×2 IMPLANT
TROCAR XCEL NON-BLD 5MMX100MML (ENDOMECHANICALS) ×2 IMPLANT
TROCAR XCEL UNIV SLVE 11M 100M (ENDOMECHANICALS) ×2 IMPLANT
TUBING INSUFFLATION (TUBING) ×2 IMPLANT
WARMER LAPAROSCOPE (MISCELLANEOUS) ×2 IMPLANT
YANKAUER SUCT 12FT TUBE ARGYLE (SUCTIONS) ×2 IMPLANT

## 2014-01-17 NOTE — Discharge Instructions (Signed)
Laparoscopic Cholecystectomy, Care After °Refer to this sheet in the next few weeks. These instructions provide you with information on caring for yourself after your procedure. Your health care provider may also give you more specific instructions. Your treatment has been planned according to current medical practices, but problems sometimes occur. Call your health care provider if you have any problems or questions after your procedure. °WHAT TO EXPECT AFTER THE PROCEDURE °After your procedure, it is typical to have the following: °· Pain at your incision sites. You will be given pain medicines to control the pain. °· Mild nausea or vomiting. This should improve after the first 24 hours. °· Bloating and possibly shoulder pain from the gas used during the procedure. This will improve after the first 24 hours. °HOME CARE INSTRUCTIONS  °· Change bandages (dressings) as directed by your health care provider. °· Keep the wound dry and clean. You may wash the wound gently with soap and water. Gently blot or dab the area dry. °· Do not take baths or use swimming pools or hot tubs for 2 weeks or until your health care provider approves. °· Only take over-the-counter or prescription medicines as directed by your health care provider. °· Continue your normal diet as directed by your health care provider. °· Do not lift anything heavier than 10 pounds (4.5 kg) until your health care provider approves. °· Do not play contact sports for 1 week or until your health care provider approves. °SEEK MEDICAL CARE IF:  °· You have redness, swelling, or increasing pain in the wound. °· You notice yellowish-white fluid (pus) coming from the wound. °· You have drainage from the wound that lasts longer than 1 day. °· You notice a bad smell coming from the wound or dressing. °· Your surgical cuts (incisions) break open. °SEEK IMMEDIATE MEDICAL CARE IF:  °· You develop a rash. °· You have difficulty breathing. °· You have chest pain. °· You  have a fever. °· You have increasing pain in the shoulders (shoulder strap areas). °· You have dizzy episodes or faint while standing. °· You have severe abdominal pain. °· You feel sick to your stomach (nauseous) or throw up (vomit) and this lasts for more than 1 day. °Document Released: 09/23/2005 Document Revised: 07/14/2013 Document Reviewed: 05/05/2013 °[employer]® Patient Information ©2014 [employer]. ° °

## 2014-01-17 NOTE — Anesthesia Procedure Notes (Signed)
Procedure Name: Intubation Date/Time: 01/17/2014 11:40 AM Performed by: Vista Deck Pre-anesthesia Checklist: Patient identified, Emergency Drugs available, Suction available, Patient being monitored and Timeout performed Patient Re-evaluated:Patient Re-evaluated prior to inductionOxygen Delivery Method: Circle system utilized Preoxygenation: Pre-oxygenation with 100% oxygen Intubation Type: IV induction, Rapid sequence and Cricoid Pressure applied Ventilation: Mask ventilation without difficulty and Oral airway inserted - appropriate to patient size Grade View: Grade I Tube type: Oral Number of attempts: 1 Airway Equipment and Method: Oral airway,  Stylet and Video-laryngoscopy Placement Confirmation: ETT inserted through vocal cords under direct vision,  positive ETCO2 and breath sounds checked- equal and bilateral Secured at: 22 cm Tube secured with: Tape Dental Injury: Teeth and Oropharynx as per pre-operative assessment

## 2014-01-17 NOTE — Transfer of Care (Signed)
Immediate Anesthesia Transfer of Care Note  Patient: Troy Thompson  Procedure(s) Performed: Procedure(s) (LRB): LAPAROSCOPIC CHOLECYSTECTOMY (N/A)  Patient Location: PACU  Anesthesia Type: General  Level of Consciousness: awake  Airway & Oxygen Therapy: Patient Spontanous Breathing and non-rebreather face mask  Post-op Assessment: Report given to PACU RN, Post -op Vital signs reviewed and stable and Patient moving all extremities  Post vital signs: Reviewed and stable  Complications: No apparent anesthesia complications

## 2014-01-17 NOTE — Interval H&P Note (Signed)
History and Physical Interval Note:  01/17/2014 10:43 AM  Troy Thompson  has presented today for surgery, with the diagnosis of cholelithiasis  The various methods of treatment have been discussed with the patient and family. After consideration of risks, benefits and other options for treatment, the patient has consented to  Procedure(s): LAPAROSCOPIC CHOLECYSTECTOMY (N/A) as a surgical intervention .  The patient's history has been reviewed, patient examined, no change in status, stable for surgery.  I have reviewed the patient's chart and labs.  Questions were answered to the patient's satisfaction.     Jamesetta So

## 2014-01-17 NOTE — Anesthesia Postprocedure Evaluation (Signed)
  Anesthesia Post-op Note  Patient: Troy Thompson  Procedure(s) Performed: Procedure(s): LAPAROSCOPIC CHOLECYSTECTOMY (N/A)  Patient Location: PACU  Anesthesia Type:General  Level of Consciousness: awake and patient cooperative  Airway and Oxygen Therapy: Patient Spontanous Breathing and non-rebreather face mask  Post-op Pain: none  Post-op Assessment: Post-op Vital signs reviewed, Patient's Cardiovascular Status Stable, Respiratory Function Stable and Patent Airway  Post-op Vital Signs: Reviewed and stable  Last Vitals:  Filed Vitals:   01/17/14 1320  BP: 143/63  Pulse: 84  Temp: 36.5 C  Resp: 20    Complications: No apparent anesthesia complications

## 2014-01-17 NOTE — Op Note (Signed)
Patient:  Troy Thompson  DOB:  [birth year]  MRN:  [id number]   Preop Diagnosis:  Cholecystitis, cholelithiasis  Postop Diagnosis:  Same  Procedure:  Laparoscopic cholecystectomy  Surgeon:  Aviva Signs, M.D.  Anes:  General endotracheal  Indications:  Patient is an 80 year old white male who presents with acute cholecystitis secondary to cholelithiasis. The risks and benefits of the procedure including bleeding, infection, hepatobiliary injury, and the possibility of an open procedure were fully explained to the patient, who gave informed consent.  Procedure note:  The patient is placed the supine position. After induction of general endotracheal anesthesia, the abdomen was prepped and draped using usual sterile technique with DuraPrep. Surgical site confirmation was performed.  A supraumbilical incision was made down to the fascia. A Veress needle was introduced into the abdominal cavity and confirmation of placement was done using the saline drop test. The abdomen was then insufflated to 16 mm mercury pressure. An 11 mm trocar was introduced into the abdominal cavity under direct visualization without difficulty. The patient was placed in reverse Trendelenburg position and additional 11 mm trocar was placed the epigastric region and 5 mm trochars were placed the right upper quadrant right flank regions. The liver was inspected and noted to be age-appropriate. The gallbladder was noted to be significantly inflamed with evidence of chronic inflammation and fat necrosis present. Multiple layers of omentum as well as a piece of the small intestine was adhesed to the gallbladder wall. These were freed away sharply and bluntly without difficulty. The gallbladder had to be decompressed in order to facilitate exposure and manipulation. The gallbladder was then retracted in a dynamic fashion in order to expose the triangle of Calot. The infundibulum was fully identified. The cystic duct was fully  identified. Its juncture to the infundibulum was fully identified. It was too wide to close with a clip, thus a vascular Endo GIA was placed across the cystic duct and fired. The cystic artery was ligated and divided without difficulty. The gallbladder was then freed away from the gallbladder fossa using Bovie electrocautery. The gallbladder delivered through the epigastric trocar site using an Endo Catch bag. The epigastric site had to be extended in order to facilitate removal of the gallbladder and stones. The gallbladder fossa was inspected and no abnormal bleeding or bile leakage was noted. Surgicel is placed the gallbladder fossa. All fluid and air were then evacuated from the abdominal cavity prior to removal of the trochars.  All wounds were irrigated with normal saline. All wounds were checked with 0.5% Sensorcaine. The supraumbilical fascia as well as epigastric fascia reapproximated using 0 Vicryl interrupted sutures. All skin incisions were closed using staples. Betadine ointment and dry sterile dressings were applied.  All tape and needle counts were correct at the end of the procedure. Patient was extubated in the operating room and transferred to PACU in stable condition.  Complications:  None  EBL:  Minimal  Specimen:  Gallbladder

## 2014-01-17 NOTE — Anesthesia Preprocedure Evaluation (Signed)
Anesthesia Evaluation  Patient identified by MRN, date of birth, ID band Patient awake    Reviewed: Allergy & Precautions, H&P , NPO status , Patient's Chart, lab work & pertinent test results, reviewed documented beta blocker date and time   Airway Mallampati: III TM Distance: >3 FB   Mouth opening: Limited Mouth Opening  Dental  (+) Partial Upper, Poor Dentition, Chipped, Missing,    Pulmonary former smoker,  breath sounds clear to auscultation        Cardiovascular hypertension, Pt. on medications and Pt. on home beta blockers + Peripheral Vascular Disease + dysrhythmias (hx freq PVC's and bigeminy.) Rhythm:Regular Rate:Normal     Neuro/Psych CVA (OD blindness), Residual Symptoms    GI/Hepatic GERD-  Medicated,  Endo/Other    Renal/GU      Musculoskeletal   Abdominal   Peds  Hematology   Anesthesia Other Findings   Reproductive/Obstetrics                           Anesthesia Physical Anesthesia Plan  ASA: III  Anesthesia Plan: General   Post-op Pain Management:    Induction: Intravenous, Rapid sequence and Cricoid pressure planned  Airway Management Planned: Oral ETT and Video Laryngoscope Planned  Additional Equipment:   Intra-op Plan:   Post-operative Plan: Extubation in OR  Informed Consent: I have reviewed the patients History and Physical, chart, labs and discussed the procedure including the risks, benefits and alternatives for the proposed anesthesia with the patient or authorized representative who has indicated his/her understanding and acceptance.     Plan Discussed with:   Anesthesia Plan Comments:         Anesthesia Quick Evaluation

## 2014-01-18 MED ORDER — POVIDONE-IODINE 10 % OINT PACKET
TOPICAL_OINTMENT | CUTANEOUS | Status: DC | PRN
  Administered 2014-01-17: 1 via TOPICAL

## 2014-01-18 MED ORDER — BUPIVACAINE HCL (PF) 0.5 % IJ SOLN
INTRAMUSCULAR | Status: DC | PRN
  Administered 2014-01-17: 10 mL

## 2014-01-18 MED ORDER — HEMOSTATIC AGENTS (NO CHARGE) OPTIME
TOPICAL | Status: DC | PRN
  Administered 2014-01-17: 1 via TOPICAL

## 2014-01-19 ENCOUNTER — Encounter (HOSPITAL_COMMUNITY): Payer: Self-pay | Admitting: General Surgery

## 2014-01-24 ENCOUNTER — Encounter (HOSPITAL_COMMUNITY): Payer: Self-pay | Admitting: Emergency Medicine

## 2014-01-24 ENCOUNTER — Emergency Department (HOSPITAL_COMMUNITY): Payer: Medicare Other

## 2014-01-24 ENCOUNTER — Inpatient Hospital Stay (HOSPITAL_COMMUNITY)
Admission: EM | Admit: 2014-01-24 | Discharge: 2014-01-26 | DRG: 195 | Disposition: A | Discharge status: Home / Self Care | Payer: Medicare Other | Attending: General Surgery | Admitting: General Surgery

## 2014-01-24 DIAGNOSIS — R509 Fever, unspecified: Secondary | ICD-10-CM | POA: Diagnosis present

## 2014-01-24 DIAGNOSIS — Z96659 Presence of unspecified artificial knee joint: Secondary | ICD-10-CM

## 2014-01-24 DIAGNOSIS — Z9089 Acquired absence of other organs: Secondary | ICD-10-CM

## 2014-01-24 DIAGNOSIS — Z87891 Personal history of nicotine dependence: Secondary | ICD-10-CM

## 2014-01-24 DIAGNOSIS — J189 Pneumonia, unspecified organism: Principal | ICD-10-CM | POA: Diagnosis present

## 2014-01-24 DIAGNOSIS — H543 Unqualified visual loss, both eyes: Secondary | ICD-10-CM | POA: Diagnosis present

## 2014-01-24 DIAGNOSIS — Z8673 Personal history of transient ischemic attack (TIA), and cerebral infarction without residual deficits: Secondary | ICD-10-CM

## 2014-01-24 DIAGNOSIS — I10 Essential (primary) hypertension: Secondary | ICD-10-CM | POA: Diagnosis present

## 2014-01-24 LAB — CBC
HCT: 31.8 % — ABNORMAL LOW (ref 39.0–52.0)
Hemoglobin: 10.8 g/dL — ABNORMAL LOW (ref 13.0–17.0)
MCH: 30.6 pg (ref 26.0–34.0)
MCHC: 34 g/dL (ref 30.0–36.0)
MCV: 90.1 fL (ref 78.0–100.0)
Platelets: 254 10*3/uL (ref 150–400)
RBC: 3.53 MIL/uL — AB (ref 4.22–5.81)
RDW: 13.7 % (ref 11.5–15.5)
WBC: 29.5 10*3/uL — ABNORMAL HIGH (ref 4.0–10.5)

## 2014-01-24 LAB — COMPREHENSIVE METABOLIC PANEL
ALT: 13 U/L (ref 0–53)
AST: 14 U/L (ref 0–37)
Albumin: 2.7 g/dL — ABNORMAL LOW (ref 3.5–5.2)
Alkaline Phosphatase: 46 U/L (ref 39–117)
BUN: 28 mg/dL — ABNORMAL HIGH (ref 6–23)
CO2: 25 meq/L (ref 19–32)
Calcium: 8.5 mg/dL (ref 8.4–10.5)
Chloride: 97 mEq/L (ref 96–112)
Creatinine, Ser: 1.58 mg/dL — ABNORMAL HIGH (ref 0.50–1.35)
GFR calc non Af Amer: 38 mL/min — ABNORMAL LOW (ref >90)
GFR, EST AFRICAN AMERICAN: 44 mL/min — AB (ref >90)
GLUCOSE: 133 mg/dL — AB (ref 70–99)
POTASSIUM: 4.4 meq/L (ref 3.7–5.3)
SODIUM: 135 meq/L — AB (ref 137–147)
TOTAL PROTEIN: 6.2 g/dL (ref 6.0–8.3)
Total Bilirubin: 0.5 mg/dL (ref 0.3–1.2)

## 2014-01-24 MED ORDER — HYDROCHLOROTHIAZIDE 25 MG PO TABS
25.0000 mg | ORAL_TABLET | Freq: Every day | ORAL | Status: DC
  Administered 2014-01-25: 25 mg via ORAL
  Filled 2014-01-24: qty 1

## 2014-01-24 MED ORDER — SODIUM CHLORIDE 0.9 % IV SOLN
INTRAVENOUS | Status: DC
  Administered 2014-01-24 – 2014-01-25 (×3): via INTRAVENOUS

## 2014-01-24 MED ORDER — METOPROLOL TARTRATE 25 MG PO TABS
25.0000 mg | ORAL_TABLET | Freq: Every morning | ORAL | Status: DC
  Administered 2014-01-25: 25 mg via ORAL
  Filled 2014-01-24: qty 1

## 2014-01-24 MED ORDER — VANCOMYCIN HCL 10 G IV SOLR
1250.0000 mg | INTRAVENOUS | Status: DC
  Administered 2014-01-25: 1250 mg via INTRAVENOUS
  Filled 2014-01-24: qty 1250

## 2014-01-24 MED ORDER — DIPHENHYDRAMINE HCL 12.5 MG/5ML PO ELIX
12.5000 mg | ORAL_SOLUTION | Freq: Four times a day (QID) | ORAL | Status: DC | PRN
  Filled 2014-01-24: qty 5

## 2014-01-24 MED ORDER — DIPHENHYDRAMINE HCL 50 MG/ML IJ SOLN
12.5000 mg | Freq: Four times a day (QID) | INTRAMUSCULAR | Status: DC | PRN
  Administered 2014-01-25 (×2): 12.5 mg via INTRAVENOUS
  Filled 2014-01-24 (×2): qty 1

## 2014-01-24 MED ORDER — AMLODIPINE BESYLATE 5 MG PO TABS
5.0000 mg | ORAL_TABLET | Freq: Every day | ORAL | Status: DC
  Administered 2014-01-25: 5 mg via ORAL
  Filled 2014-01-24: qty 1

## 2014-01-24 MED ORDER — IRBESARTAN 300 MG PO TABS
300.0000 mg | ORAL_TABLET | Freq: Every day | ORAL | Status: DC
  Administered 2014-01-25: 300 mg via ORAL
  Filled 2014-01-24: qty 1

## 2014-01-24 MED ORDER — IOHEXOL 300 MG/ML  SOLN
80.0000 mL | Freq: Once | INTRAMUSCULAR | Status: AC | PRN
Start: 2014-01-24 — End: 2014-01-24
  Administered 2014-01-24: 80 mL via INTRAVENOUS

## 2014-01-24 MED ORDER — MORPHINE SULFATE 2 MG/ML IJ SOLN: 2.0000 mg | INTRAMUSCULAR | Status: DC | PRN

## 2014-01-24 MED ORDER — PIPERACILLIN-TAZOBACTAM 3.375 G IVPB
3.3750 g | Freq: Three times a day (TID) | INTRAVENOUS | Status: DC
  Administered 2014-01-24 – 2014-01-25 (×4): 3.375 g via INTRAVENOUS
  Filled 2014-01-24 (×7): qty 50

## 2014-01-24 MED ORDER — IOHEXOL 300 MG/ML  SOLN
50.0000 mL | INTRAMUSCULAR | Status: AC
  Administered 2014-01-24: 50 mL via ORAL

## 2014-01-24 MED ORDER — DOCUSATE SODIUM 100 MG PO CAPS: 150.0000 mg | ORAL_CAPSULE | Freq: Two times a day (BID) | ORAL | Status: DC | PRN

## 2014-01-24 MED ORDER — ACETAMINOPHEN 325 MG PO TABS: 650.0000 mg | ORAL_TABLET | Freq: Four times a day (QID) | ORAL | Status: DC | PRN

## 2014-01-24 MED ORDER — VALSARTAN-HYDROCHLOROTHIAZIDE 320-25 MG PO TABS: 1.0000 | ORAL_TABLET | Freq: Every morning | ORAL | Status: DC

## 2014-01-24 MED ORDER — VANCOMYCIN HCL 10 G IV SOLR
1500.0000 mg | Freq: Once | INTRAVENOUS | Status: AC
  Administered 2014-01-24: 1500 mg via INTRAVENOUS
  Filled 2014-01-24: qty 1500

## 2014-01-24 MED ORDER — ACETAMINOPHEN 650 MG RE SUPP: 650.0000 mg | Freq: Four times a day (QID) | RECTAL | Status: DC | PRN

## 2014-01-24 MED ORDER — HYDROCODONE-ACETAMINOPHEN 5-325 MG PO TABS: 1.0000 | ORAL_TABLET | ORAL | Status: DC | PRN

## 2014-01-24 MED ORDER — SENNA 8.6 MG PO TABS: 2.0000 | ORAL_TABLET | Freq: Every day | ORAL | Status: DC | PRN

## 2014-01-24 MED ORDER — ZOLPIDEM TARTRATE 5 MG PO TABS
5.0000 mg | ORAL_TABLET | Freq: Every evening | ORAL | Status: DC | PRN
  Administered 2014-01-24: 5 mg via ORAL
  Filled 2014-01-24: qty 1

## 2014-01-24 MED ORDER — ENOXAPARIN SODIUM 40 MG/0.4ML ~~LOC~~ SOLN
40.0000 mg | SUBCUTANEOUS | Status: DC
  Administered 2014-01-25: 40 mg via SUBCUTANEOUS
  Filled 2014-01-24: qty 0.4

## 2014-01-24 NOTE — ED Notes (Signed)
Dr. Arnoldo Morale to see pt in the ED. PT has a lap chole last week. Developed a fever at 0200 of 103 along with confusion and slight SOB. NAD. Pt is currently afebrile. Pt did not take any medication for fever.

## 2014-01-24 NOTE — Progress Notes (Signed)
ANTIBIOTIC CONSULT NOTE - INITIAL  Pharmacy Consult for Vancomycin  Indication: pneumonia  No Known Allergies  Patient Measurements: Height: 5\' 10"  (177.8 cm) Weight: 195 lb 1.7 oz (88.5 kg) IBW/kg (Calculated) : 73  Vital Signs: Temp: 98.8 F (37.1 C) (04/20 1602) Temp src: Oral (04/20 1602) BP: 136/55 mmHg (04/20 1602) Pulse Rate: 83 (04/20 1602) Intake/Output from previous day:   Intake/Output from this shift:    Labs:  Recent Labs  01/24/14 1105  WBC 29.5*  HGB 10.8*  PLT 254  CREATININE 1.58*   Estimated Creatinine Clearance: 37.6 ml/min (by C-G formula based on Cr of 1.58). No results found for this basename: VANCOTROUGH, VANCOPEAK, VANCORANDOM, GENTTROUGH, GENTPEAK, GENTRANDOM, TOBRATROUGH, TOBRAPEAK, TOBRARND, AMIKACINPEAK, AMIKACINTROU, AMIKACIN,  in the last 72 hours   Microbiology: No results found for this or any previous visit (from the past 720 hour(s)).  Medical History: Past Medical History  Diagnosis Date  . Hypertension   . Carotid artery occlusion   . Retinal embolus, right 12/30/2007    Sudden onset of blindness   . Stroke    Medications:  Scheduled:  . [START ON 01/25/2014] amLODipine  5 mg Oral Daily  . [START ON 01/25/2014] enoxaparin (LOVENOX) injection  40 mg Subcutaneous Q24H  . [START ON 01/25/2014] hydrochlorothiazide  25 mg Oral Daily   And  . [START ON 01/25/2014] irbesartan  300 mg Oral Daily  . [START ON 01/25/2014] metoprolol tartrate  25 mg Oral q morning - 10a  . piperacillin-tazobactam (ZOSYN)  IV  3.375 g Intravenous 3 times per day  . [START ON 01/25/2014] vancomycin  1,250 mg Intravenous Q24H  . vancomycin  1,500 mg Intravenous Once   Assessment: 80yo male admitted with fever and chills.  Starting Vancomycin and Zosyn for suspected pneumonia.  Estimated Creatinine Clearance: 37.6 ml/min (by C-G formula based on Cr of 1.58).  Vancomycin 4/20 >> Zosyn 4/20 >>  Goal of Therapy:  Vancomycin trough level 15-20  mcg/ml  Plan:  Vancomycin 1500mg  IV today x 1 (loading dose) then Vancomycin 1250mg  IV q24hrs Check trough at steady state or as indicated Zosyn 3.375gm IV q8h, each dose over 4 hrs Monitor labs, renal fxn, and cultures  Raschelle Wisenbaker A Lashina Milles 01/24/2014,4:28 PM

## 2014-01-24 NOTE — ED Notes (Signed)
Attempted report. Receiving Unit secretary reported that RN would call back for report.

## 2014-01-24 NOTE — H&P (Signed)
Troy Thompson is an 80 y.o. male.   Chief Complaint: Fever and chills, cough HPI: Patient is an 80 year old white male status post laparoscopic cholecystectomy approximately one week ago who yesterday evening began experiencing fever and chills to 10 17F. He states he has had a cough, though it seemed to be worse this weekend. His appetite is decreased. He denies any significant abdominal pain. He has not taken pain pills recently. No nausea or vomiting have been noted. No diarrhea has been noted.  Past Medical History  Diagnosis Date  . Hypertension   . Carotid artery occlusion   . Retinal embolus, right 12/30/2007    Sudden onset of blindness   . Stroke     Past Surgical History  Procedure Laterality Date  . Joint replacement      knee  . Hernia repair    . Carotid endarterectomy  01/26/2008    Right   . Colonoscopy  03/15/2008    [id number] rectum/Left-sided transverse diverticula/Diminutive cecal polyp status post cold biopsy  . Colonoscopy  09/24/2002    [id number] rectum/Left-sided diverticulum/Polyp at the splenic flexure removed   . Cholecystectomy N/A 01/17/2014    Procedure: LAPAROSCOPIC CHOLECYSTECTOMY;  Surgeon: Jamesetta So, MD;  Location: [facility];  Service: General;  Laterality: N/A;    No family history on file. Social History:  reports that he has quit smoking. His smoking use included Cigarettes. He smoked 0.00 packs per day for 22 years. He does not have any smokeless tobacco history on file. He reports that he does not drink alcohol or use illicit drugs.  Allergies: No Known Allergies   (Not in a hospital admission)  Results for orders placed during the hospital encounter of 01/24/14 (from the past 48 hour(s))  CBC     Status: Abnormal   Collection Time    01/24/14 11:05 AM      Result Value Ref Range   WBC 29.5 (*) 4.0 - 10.5 K/uL   RBC 3.53 (*) 4.22 - 5.81 MIL/uL   Hemoglobin 10.8 (*) 13.0 - 17.0 g/dL   HCT 31.8 (*) 39.0 - 52.0 %   MCV 90.1  78.0  - 100.0 fL   MCH 30.6  26.0 - 34.0 pg   MCHC 34.0  30.0 - 36.0 g/dL   RDW 13.7  11.5 - 15.5 %   Platelets 254  150 - 400 K/uL  COMPREHENSIVE METABOLIC PANEL     Status: Abnormal   Collection Time    01/24/14 11:05 AM      Result Value Ref Range   Sodium 135 (*) 137 - 147 mEq/L   Potassium 4.4  3.7 - 5.3 mEq/L   Chloride 97  96 - 112 mEq/L   CO2 25  19 - 32 mEq/L   Glucose, Bld 133 (*) 70 - 99 mg/dL   BUN 28 (*) 6 - 23 mg/dL   Creatinine, Ser 1.58 (*) 0.50 - 1.35 mg/dL   Calcium 8.5  8.4 - 10.5 mg/dL   Total Protein 6.2  6.0 - 8.3 g/dL   Albumin 2.7 (*) 3.5 - 5.2 g/dL   AST 14  0 - 37 U/L   ALT 13  0 - 53 U/L   Alkaline Phosphatase 46  39 - 117 U/L   Total Bilirubin 0.5  0.3 - 1.2 mg/dL   GFR calc non Af Amer 38 (*) >90 mL/min   GFR calc Af Amer 44 (*) >90 mL/min   Comment: (NOTE)     The  eGFR has been calculated using the CKD EPI equation.     This calculation has not been validated in all clinical situations.     eGFR's persistently <90 mL/min signify possible Chronic Kidney     Disease.   Dg Chest 2 View  01/24/2014   CLINICAL DATA:  Cholecystectomy 1 week ago with postoperative fever and shortness of breath.  EXAM: CHEST  2 VIEW  COMPARISON:  CT ABD - PELV W/ CM dated 01/12/2014; DG ABDOMEN 1V dated 01/02/2014; DG CHEST 2 VIEW dated 01/11/2008  FINDINGS: The heart size and mediastinal contours are stable. There is stable eventration of the right hemidiaphragm. Pneumoperitoneum is noted, presumably related to recent cholecystectomy. There is new perihilar airspace disease in the right lung worrisome for pneumonia. The left lung is clear. There is no significant pleural effusion or pneumothorax.  IMPRESSION: 1. New right perihilar airspace opacities suspicious for pneumonia. 2. Pneumoperitoneum, presumably related to recent cholecystectomy. If there is concern of bowel injury, further evaluation with CT should be considered.   Electronically Signed   By: Camie Patience M.D.   On: 01/24/2014  11:19    Review of Systems  Constitutional: Positive for fever, chills and malaise/fatigue.  HENT: Negative.   Eyes: Negative.   Respiratory: Positive for cough.   Cardiovascular: Negative.   Gastrointestinal: Negative.   Genitourinary: Negative.   Musculoskeletal: Negative.   Skin: Negative.   All other systems reviewed and are negative.   Blood pressure 114/34, pulse 77, temperature 98.5 F (36.9 C), temperature source Oral, resp. rate 18, height '5\' 10"'  (1.778 m), weight 86.183 kg (190 lb), SpO2 91.00%. Physical Exam  Vitals reviewed. Constitutional: He is oriented to person, place, and time. He appears well-developed and well-nourished.  HENT:  Head: Normocephalic and atraumatic.  Neck: Normal range of motion. Neck supple.  Cardiovascular: Normal rate, regular rhythm and normal heart sounds.   Respiratory: Effort normal and breath sounds normal. No respiratory distress. He has no wheezes. He has no rales.  Decreased breath sounds on right, no wheezing or rhonchi noted.  GI: Soft. Bowel sounds are normal. He exhibits no distension. There is no tenderness. There is no rebound.  No rigidity noted.  Neurological: He is alert and oriented to person, place, and time.  Skin: Skin is warm and dry.     Assessment/Plan Impression: Fever and chills, probable right-sided pneumonia. Will rule out intra-abdominal abscess. CT scan the abdomen and pelvis is pending. He will be admitted the hospital for treatment of the pneumonia, IV Zosyn, and further evaluation of his abdomen. Further management is pending results of the CT scan of the abdomen  Jamesetta So 01/24/2014, 2:19 PM

## 2014-01-24 NOTE — ED Notes (Addendum)
Attempted report x2. Receiving RN reports would call back.

## 2014-01-25 ENCOUNTER — Inpatient Hospital Stay (HOSPITAL_COMMUNITY): Payer: Medicare Other

## 2014-01-25 ENCOUNTER — Encounter (HOSPITAL_COMMUNITY): Payer: Self-pay

## 2014-01-25 LAB — CBC
HCT: 28.2 % — ABNORMAL LOW (ref 39.0–52.0)
HEMOGLOBIN: 9.8 g/dL — AB (ref 13.0–17.0)
MCH: 31 pg (ref 26.0–34.0)
MCHC: 34.8 g/dL (ref 30.0–36.0)
MCV: 89.2 fL (ref 78.0–100.0)
PLATELETS: 222 10*3/uL (ref 150–400)
RBC: 3.16 MIL/uL — AB (ref 4.22–5.81)
RDW: 13.7 % (ref 11.5–15.5)
WBC: 19.8 10*3/uL — ABNORMAL HIGH (ref 4.0–10.5)

## 2014-01-25 LAB — BASIC METABOLIC PANEL
BUN: 26 mg/dL — ABNORMAL HIGH (ref 6–23)
CO2: 24 meq/L (ref 19–32)
Calcium: 7.8 mg/dL — ABNORMAL LOW (ref 8.4–10.5)
Chloride: 98 mEq/L (ref 96–112)
Creatinine, Ser: 1.53 mg/dL — ABNORMAL HIGH (ref 0.50–1.35)
GFR calc non Af Amer: 39 mL/min — ABNORMAL LOW (ref >90)
GFR, EST AFRICAN AMERICAN: 46 mL/min — AB (ref >90)
Glucose, Bld: 101 mg/dL — ABNORMAL HIGH (ref 70–99)
POTASSIUM: 3.7 meq/L (ref 3.7–5.3)
Sodium: 134 mEq/L — ABNORMAL LOW (ref 137–147)

## 2014-01-25 LAB — HEPATIC FUNCTION PANEL
ALT: 10 U/L (ref 0–53)
AST: 13 U/L (ref 0–37)
Albumin: 2.5 g/dL — ABNORMAL LOW (ref 3.5–5.2)
Alkaline Phosphatase: 59 U/L (ref 39–117)
Bilirubin, Direct: <0.2 mg/dL (ref 0.0–0.3)
Total Bilirubin: 0.5 mg/dL (ref 0.3–1.2)
Total Protein: 5.5 g/dL — ABNORMAL LOW (ref 6.0–8.3)

## 2014-01-25 LAB — PHOSPHORUS: Phosphorus: 3 mg/dL (ref 2.3–4.6)

## 2014-01-25 LAB — MAGNESIUM: MAGNESIUM: 1.5 mg/dL (ref 1.5–2.5)

## 2014-01-25 MED ORDER — DIPHENHYDRAMINE HCL 50 MG/ML IJ SOLN
25.0000 mg | Freq: Four times a day (QID) | INTRAMUSCULAR | Status: DC | PRN
  Administered 2014-01-25: 25 mg via INTRAVENOUS
  Filled 2014-01-25: qty 1

## 2014-01-25 MED ORDER — DIPHENHYDRAMINE HCL 12.5 MG/5ML PO ELIX: 25.0000 mg | ORAL_SOLUTION | Freq: Four times a day (QID) | ORAL | Status: DC | PRN

## 2014-01-25 MED ORDER — TECHNETIUM TC 99M MEBROFENIN IV KIT
5.0000 | PACK | Freq: Once | INTRAVENOUS | Status: AC | PRN
  Administered 2014-01-25: 5 via INTRAVENOUS

## 2014-01-25 MED ORDER — METRONIDAZOLE IN NACL 5-0.79 MG/ML-% IV SOLN
500.0000 mg | Freq: Three times a day (TID) | INTRAVENOUS | Status: DC
  Administered 2014-01-25 – 2014-01-26 (×2): 500 mg via INTRAVENOUS
  Filled 2014-01-25 (×2): qty 100

## 2014-01-25 MED ORDER — LIDOCAINE HCL 2 % EX GEL
CUTANEOUS | Status: AC
  Filled 2014-01-25: qty 10

## 2014-01-25 MED ORDER — CIPROFLOXACIN IN D5W 400 MG/200ML IV SOLN
400.0000 mg | Freq: Two times a day (BID) | INTRAVENOUS | Status: DC
  Administered 2014-01-25 – 2014-01-26 (×2): 400 mg via INTRAVENOUS
  Filled 2014-01-25 (×2): qty 200

## 2014-01-25 NOTE — Progress Notes (Signed)
UR chart review completed.  

## 2014-01-25 NOTE — Care Management Note (Addendum)
    Page 1 of 1   01/26/2014     8:19:53 AM CARE MANAGEMENT NOTE 01/26/2014  Patient:  Troy Thompson, Troy Thompson   Account Number:  [id number]  Date Initiated:  01/25/2014  Documentation initiated by:  Theophilus Kinds  Subjective/Objective Assessment:   Pt aadmitted from home with pneumonia. Pt lives with his wife and will return home at discharge. Pt is very independent with ADL's.     Action/Plan:   No Cm needs noted.   Anticipated DC Date:  01/28/2014   Anticipated DC Plan:  [place]  CM consult      Choice offered to / List presented to:             Status of service:  Completed, signed off Medicare Important Message given?  NA - LOS <3 / Initial given by admissions (If response is "NO", the following Medicare IM given date fields will be blank) Date Medicare IM given:   Date Additional Medicare IM given:    Discharge Disposition:  HOME/SELF CARE  Per UR Regulation:    If discussed at Long Length of Stay Meetings, dates discussed:    Comments:  01/26/14 [place], RN BSN CM Pt discharged home today. No CM needs noted.  01/25/14 [place], RN BSN CM

## 2014-01-25 NOTE — Progress Notes (Signed)
Pt continues to c/o itching and hives noted to bilateral arms, legs and neck. Dr. Arnoldo Morale made aware and new orders to increase current Benadryl order to 25 mg PO/IV PRN q 6 hours.

## 2014-01-25 NOTE — Progress Notes (Signed)
Subjective: Feels much better. No abdominal pain. No productive cough. Objective: Vital signs in last 24 hours: Temp:  [98.1 F (36.7 C)-99 F (37.2 C)] 98.5 F (36.9 C) (04/21 0700) Pulse Rate:  [75-91] 75 (04/21 0700) Resp:  [16-20] 20 (04/21 0700) BP: (103-136)/(30-61) 103/61 mmHg (04/21 0700) SpO2:  [91 %-95 %] 93 % (04/21 0700) Weight:  [86.183 kg (190 lb)-88.5 kg (195 lb 1.7 oz)] 88.5 kg (195 lb 1.7 oz) (04/20 1545) Last BM Date: 01/24/14  Intake/Output from previous day: 04/20 0701 - 04/21 0700 In: 1206.7 [I.V.:556.7; IV Piggyback:650] Out: 225 [Urine:225] Intake/Output this shift: Total I/O In: 400 [P.O.:400] Out: 225 [Urine:225]  General appearance: alert, cooperative and no distress Resp: clear to auscultation bilaterally Cardio: regular rate and rhythm, S1, S2 normal, no murmur, click, rub or gallop GI: soft, non-tender; bowel sounds normal; no masses,  no organomegaly and Incisions healing well.  Lab Results:   Recent Labs  01/24/14 1105 01/25/14 0536  WBC 29.5* 19.8*  HGB 10.8* 9.8*  HCT 31.8* 28.2*  PLT 254 222   BMET  Recent Labs  01/24/14 1105 01/25/14 0536  NA 135* 134*  K 4.4 3.7  CL 97 98  CO2 25 24  GLUCOSE 133* 101*  BUN 28* 26*  CREATININE 1.58* 1.53*  CALCIUM 8.5 7.8*   PT/INR No results found for this basename: LABPROT, INR,  in the last 72 hours  Studies/Results: Dg Chest 2 View  01/24/2014   CLINICAL DATA:  Cholecystectomy 1 week ago with postoperative fever and shortness of breath.  EXAM: CHEST  2 VIEW  COMPARISON:  CT ABD - PELV W/ CM dated 01/12/2014; DG ABDOMEN 1V dated 01/02/2014; DG CHEST 2 VIEW dated 01/11/2008  FINDINGS: The heart size and mediastinal contours are stable. There is stable eventration of the right hemidiaphragm. Pneumoperitoneum is noted, presumably related to recent cholecystectomy. There is new perihilar airspace disease in the right lung worrisome for pneumonia. The left lung is clear. There is no  significant pleural effusion or pneumothorax.  IMPRESSION: 1. New right perihilar airspace opacities suspicious for pneumonia. 2. Pneumoperitoneum, presumably related to recent cholecystectomy. If there is concern of bowel injury, further evaluation with CT should be considered.   Electronically Signed   By: Camie Patience M.D.   On: 01/24/2014 11:19   Ct Abdomen Pelvis W Contrast  01/24/2014   CLINICAL DATA:  Abdominal pain status post laparoscopic cholecystectomy 1 week ago.  EXAM: CT ABDOMEN AND PELVIS WITH CONTRAST  TECHNIQUE: Multidetector CT imaging of the abdomen and pelvis was performed using the standard protocol following bolus administration of intravenous contrast.  CONTRAST:  80mL OMNIPAQUE IOHEXOL 300 MG/ML  SOLN  COMPARISON:  CT ABD - PELV W/ CM dated 01/12/2014; DG ABDOMEN 1V dated 01/02/2014; DG CHEST 2 VIEW dated 01/24/2014  FINDINGS: As demonstrated on radiographs obtained earlier today, there are new perihilar airspace opacities on the right with air bronchograms suspicious for pneumonia. There is involvement of the upper, middle and lower lobes. There is a small right pleural effusion. The left lung base is clear. A moderate size hiatal hernia and coronary artery calcifications are noted.  Patient has undergone interval cholecystectomy. There is a complex fluid collection within the cholecystectomy bed which contains air bubbles. The component superiorly within the cholecystectomy bed measures 6.8 x 4.5 cm on image 32. This is contiguous with a collection more inferiorly measuring 7.4 cm on image 36. No other extraluminal fluid collections are identified. There is no significant ascites. The  enteric contrast has passed throughout the GI tract. There is no extravasated enteric contrast. As noted on the early radiographs, there is extraluminal air beneath both hemidiaphragms. There is also some subcutaneous gas within the upper anterior abdominal wall.  There is no significant biliary dilatation.  Small low-density lesion in the dome of the right hepatic lobe on image 19 is stable. The spleen, pancreas and adrenal glands appear normal. Low density renal lesions bilaterally are grossly stable. There is no hydronephrosis.  Diffuse aortoiliac atherosclerosis appears stable. There is no adenopathy. There is no evidence of bowel obstruction. The appendix appears normal. The urinary bladder and prostate gland have stable appearances. There are no worrisome osseous findings.  IMPRESSION: 1. Patchy peribronchial airspace opacities in the right lung consistent with pneumonia. Small associated right pleural effusion. 2. Complex fluid collection within the cholecystectomy bed is nonspecific, although potentially a biloma. There is no significant ascites or biliary dilatation. 3. No demonstrated explanation for the pneumoperitoneum seen on earlier radiographs is identified. Although more than average 1 week following cholecystectomy, there is no extravasated enteric contrast or evidence of bowel obstruction.   Electronically Signed   By: Camie Patience M.D.   On: 01/24/2014 15:12    Anti-infectives: Anti-infectives   Start     Dose/Rate Route Frequency Ordered Stop   01/25/14 1800  vancomycin (VANCOCIN) 1,250 mg in sodium chloride 0.9 % 250 mL IVPB     1,250 mg 166.7 mL/hr over 90 Minutes Intravenous Every 24 hours 01/24/14 1627     01/24/14 1730  vancomycin (VANCOCIN) 1,500 mg in sodium chloride 0.9 % 500 mL IVPB     1,500 mg 250 mL/hr over 120 Minutes Intravenous  Once 01/24/14 1626 01/24/14 1942   01/24/14 1400  piperacillin-tazobactam (ZOSYN) IVPB 3.375 g     3.375 g 12.5 mL/hr over 240 Minutes Intravenous 3 times per day 01/24/14 1147        Assessment/Plan: Impression: Clinically, patient much improved. Leukocytosis resolving. CT scan of abdomen shows a confusing picture of some fluid collection in the subhepatic space as well as a large amount of pneumoperitoneum than one would expect after  surgery. He does not have peritoneal signs. We'll get HIDA scan today to confirm no bile leak. Continue IV antibiotics.  LOS: 1 day    Jamesetta So 01/25/2014

## 2014-01-25 NOTE — Progress Notes (Signed)
Pt c/o itching. Hives noted to bilateral arms, legs and neck. Pt states "I was having this problem before I came in".  Clarified with patient and family that patient was in fact experiencing itching to arms at home, however was not experiencing hives. IV Zosyn noted to be infusing at this time. IV Zosyn stopped at this time. IV Benadryl 12.5mg  given per PRN order. Dr. Arnoldo Morale paged and made aware. IV Zosyn order d/c'd per telephone order. Dr. Arnoldo Morale states will follow up with new orders. Will continue to monitor.

## 2014-01-26 LAB — CBC
HEMATOCRIT: 30.1 % — AB (ref 39.0–52.0)
HEMOGLOBIN: 10.3 g/dL — AB (ref 13.0–17.0)
MCH: 30.5 pg (ref 26.0–34.0)
MCHC: 34.2 g/dL (ref 30.0–36.0)
MCV: 89.1 fL (ref 78.0–100.0)
Platelets: 235 10*3/uL (ref 150–400)
RBC: 3.38 MIL/uL — ABNORMAL LOW (ref 4.22–5.81)
RDW: 13.9 % (ref 11.5–15.5)
WBC: 14 10*3/uL — ABNORMAL HIGH (ref 4.0–10.5)

## 2014-01-26 MED ORDER — METRONIDAZOLE 250 MG PO TABS: 250.0000 mg | ORAL_TABLET | Freq: Three times a day (TID) | ORAL | Status: DC

## 2014-01-26 MED ORDER — CIPROFLOXACIN HCL 500 MG PO TABS: 500.0000 mg | ORAL_TABLET | Freq: Two times a day (BID) | ORAL | Status: DC

## 2014-01-26 NOTE — Progress Notes (Signed)
Patient states understanding of discharge instructions, prescriptions given 

## 2014-01-26 NOTE — Discharge Instructions (Signed)
Fever, Adult A fever is a temperature of 100.4 F (38 C) or above.  HOME CARE  Take fever medicine as told by your doctor. Do not  take aspirin for fever if you are younger than 78 years of age.  If you are given antibiotic medicine, take it as told. Finish the medicine even if you start to feel better.  Rest.  Drink enough fluids to keep your pee (urine) clear or pale yellow. Do not drink alcohol.  Take a bath or shower with room temperature water. Do not use ice water or alcohol sponge baths.  Wear lightweight, loose clothes. GET HELP RIGHT AWAY IF:   You are short of breath or have trouble breathing.  You are very weak.  You are dizzy or you pass out (faint).  You are very thirsty or are making little or no urine.  You have new pain.  You throw up (vomit) or have watery poop (diarrhea).  You keep throwing up or having watery poop for more than 1 to 2 days.  You have a stiff neck or light bothers your eyes.  You have a skin rash.  You have a fever or problems (symptoms) that last for more than 2 to 3 days.  You have a fever and your problems quickly get worse.  You keep throwing up the fluids you drink.  You do not feel better after 3 days.  You have new problems. MAKE SURE YOU:   Understand these instructions.  Will watch your condition.  Will get help right away if you are not doing well or get worse. Document Released: 07/02/2008 Document Revised: 12/16/2011 Document Reviewed: 07/25/2011 [facility] Patient Information 2014 [place].

## 2014-01-26 NOTE — Discharge Summary (Signed)
Physician Discharge Summary  Patient ID: Troy Thompson MRN: [id number] DOB/AGE: 11/01/1924 80 y.o.  Admit date: 01/24/2014 Discharge date: 01/26/2014  Admission Diagnoses: Fever of unknown etiology  Discharge Diagnoses: Right lung infiltrate, status post laparoscopic cholecystectomy with intra-abdominal fluid collection Active Problems:   Fever and chills   Discharged Condition: good  Hospital Course: Patient is an 80 year old white male status post laparoscopic cholecystectomy for acute cholecystitis and cholelithiasis who presented to the emergency room on 01/24/2014 with worsening fever. Chest x-ray revealed a right sided mild infiltrate. Patient having significant leukocytosis. Abdominal CAT scan revealed fluid within the gallbladder fossa, but slightly more free air in the abdominal cavity then one would see a week after a laparoscopic procedure. He was admitted to the hospital for IV antibiotics and further monitoring. A hiatus scan was performed which was negative for bile leak. The patient was afebrile while in the hospital. He initially was started on Zosyn and vancomycin. He developed welts, so the Zosyn was stopped and he was switched to ciprofloxacin and Flagyl. His abdominal examination remains unremarkable. His leukocytosis is significantly improved. He is being discharged home today in good improving condition.  Discharge Exam: Blood pressure 147/49, pulse 82, temperature 97.8 F (36.6 C), temperature source Oral, resp. rate 20, height 5\' 10"  (1.778 m), weight 88.5 kg (195 lb 1.7 oz), SpO2 96.00%. General appearance: alert, cooperative and no distress Resp: clear to auscultation bilaterally Cardio: regular rate and rhythm, S1, S2 normal, no murmur, click, rub or gallop GI: soft, non-tender; bowel sounds normal; no masses,  no organomegaly and Incisions healing well. Staples removed and Steri-Strips applied.  Disposition: 01-Home or Self Care   Future Appointments  Provider Department Dept Phone   03/01/2014 11:00 AM [facility] [phone]   03/01/2014 12:15 PM Rosetta Posner, MD [facility] [phone]   01/09/2015 7:45 AM Hayden Pedro, MD [facility] [phone]       Medication List         amLODipine 5 MG tablet  Commonly known as:  NORVASC  Take 5 mg by mouth daily.     aspirin 81 MG tablet  Take 81 mg by mouth at bedtime.     ciprofloxacin 500 MG tablet  Commonly known as:  CIPRO  Take 1 tablet (500 mg total) by mouth 2 (two) times daily. 7 day course starting on 01/13/2014     docusate sodium 50 MG capsule  Commonly known as:  COLACE  Take 150 mg by mouth 2 (two) times daily as needed for moderate constipation.     GAS RELIEF EXTRA STRENGTH 125 MG chewable tablet  Generic drug:  simethicone  Chew 125 mg by mouth every 6 (six) hours as needed for flatulence.     HYDROcodone-acetaminophen 5-325 MG per tablet  Commonly known as:  NORCO/VICODIN  Take 1 tablet by mouth every 4 (four) hours as needed for moderate pain.     metoprolol tartrate 25 MG tablet  Commonly known as:  LOPRESSOR  Take 25 mg by mouth every morning.     metroNIDAZOLE 250 MG tablet  Commonly known as:  FLAGYL  Take 1 tablet (250 mg total) by mouth 3 (three) times daily.     pantoprazole 20 MG tablet  Commonly known as:  PROTONIX  Take 2 tablets (40 mg total) by mouth daily.     polyethylene glycol packet  Commonly known as:  MIRALAX / GLYCOLAX  Take 17 g by mouth 2 (two) times daily as needed.     PRESERVISION AREDS 2 PO  Take 1 tablet by mouth 2 (two) times daily.     senna 8.6 MG Tabs tablet  Commonly known as:  SENOKOT  Take 2 tablets by mouth daily as needed for moderate constipation.     simvastatin 20 MG tablet  Commonly known as:  ZOCOR  Take 20 mg by mouth at bedtime.     SM DRY EYE RELIEF OP  Apply 1 drop to eye daily as needed (for dry eye  relief).     valsartan-hydrochlorothiazide 320-25 MG per tablet  Commonly known as:  DIOVAN-HCT  Take 1 tablet by mouth every morning.           Follow-up Information   Follow up with Jamesetta So, MD. Schedule an appointment as soon as possible for a visit on 02/01/2014.   Specialty:  General Surgery   Contact information:   [address] [address] [phone]       Signed: Jamesetta So 01/26/2014, 7:21 AM

## 2014-01-29 LAB — CULTURE, BLOOD (ROUTINE X 2)
Culture: NO GROWTH
Culture: NO GROWTH

## 2014-02-25 ENCOUNTER — Encounter: Payer: Self-pay | Admitting: Vascular Surgery

## 2014-03-01 ENCOUNTER — Ambulatory Visit (HOSPITAL_COMMUNITY)
Admission: RE | Admit: 2014-03-01 | Discharge: 2014-03-01 | Disposition: A | Discharge status: Home / Self Care | Payer: Medicare Other | Source: Ambulatory Visit | Attending: Vascular Surgery | Admitting: Vascular Surgery

## 2014-03-01 ENCOUNTER — Ambulatory Visit (INDEPENDENT_AMBULATORY_CARE_PROVIDER_SITE_OTHER): Payer: Medicare Other | Admitting: Vascular Surgery

## 2014-03-01 ENCOUNTER — Encounter: Payer: Self-pay | Admitting: Vascular Surgery

## 2014-03-01 VITALS — BP 158/58 | HR 49 | Resp 18 | Ht 70.0 in | Wt 186.3 lb

## 2014-03-01 DIAGNOSIS — Z48812 Encounter for surgical aftercare following surgery on the circulatory system: Secondary | ICD-10-CM

## 2014-03-01 DIAGNOSIS — I6529 Occlusion and stenosis of unspecified carotid artery: Secondary | ICD-10-CM

## 2014-03-01 NOTE — Progress Notes (Signed)
The patient has today for followup of his right carotid endarterectomy in 2009. He is here today for his yearly carotid surveillance. He did have difficulty with acute cholecystitis is undergoing a cholecystectomy in [place] April and then had pneumonia following this and was readmitted. He is back to his baseline. He specifically denies any neurologic deficits and no visual changes. His initial surgery was 4 retinal embolus. He did not have a high-grade stenosis but did have a very irregular ulcerative plaque has had no further visual changes since his surgery in 2009  Past Medical History  Diagnosis Date  . Hypertension   . Carotid artery occlusion   . Retinal embolus, right 12/30/2007    Sudden onset of blindness   . Stroke   . Pneumonia     History  Substance Use Topics  . Smoking status: Former Smoker -- 22 years    Types: Cigarettes  . Smokeless tobacco: Never Used  . Alcohol Use: No    History reviewed. No pertinent family history.  No Known Allergies  Current outpatient prescriptions:amLODipine (NORVASC) 5 MG tablet, Take 5 mg by mouth daily., Disp: , Rfl: ;  aspirin 81 MG tablet, Take 81 mg by mouth at bedtime. , Disp: , Rfl: ;  Glycerin-Hypromellose-PEG 400 (SM DRY EYE RELIEF OP), Apply 1 drop to eye daily as needed (for dry eye relief)., Disp: , Rfl: ;  metoprolol tartrate (LOPRESSOR) 25 MG tablet, Take 25 mg by mouth every morning. , Disp: , Rfl:  Multiple Vitamins-Minerals (PRESERVISION AREDS 2 PO), Take 1 tablet by mouth 2 (two) times daily., Disp: , Rfl: ;  omeprazole (PRILOSEC) 20 MG capsule, 20 mg daily., Disp: , Rfl: ;  simvastatin (ZOCOR) 20 MG tablet, Take 20 mg by mouth at bedtime. , Disp: , Rfl: ;  valsartan-hydrochlorothiazide (DIOVAN-HCT) 320-25 MG per tablet, Take 1 tablet by mouth every morning. , Disp: , Rfl:  ciprofloxacin (CIPRO) 500 MG tablet, Take 1 tablet (500 mg total) by mouth 2 (two) times daily. 7 day course starting on 01/13/2014, Disp: 14 tablet, Rfl: 0;   docusate sodium (COLACE) 50 MG capsule, Take 150 mg by mouth 2 (two) times daily as needed for moderate constipation., Disp: , Rfl: ;  HYDROcodone-acetaminophen (NORCO/VICODIN) 5-325 MG per tablet, Take 1 tablet by mouth every 4 (four) hours as needed for moderate pain., Disp: 50 tablet, Rfl: 0 metroNIDAZOLE (FLAGYL) 250 MG tablet, Take 1 tablet (250 mg total) by mouth 3 (three) times daily., Disp: 21 tablet, Rfl: 0;  pantoprazole (PROTONIX) 20 MG tablet, Take 2 tablets (40 mg total) by mouth daily., Disp: 60 tablet, Rfl: 0;  polyethylene glycol (MIRALAX / GLYCOLAX) packet, Take 17 g by mouth 2 (two) times daily as needed., Disp: 14 each, Rfl: 0 senna (SENOKOT) 8.6 MG TABS tablet, Take 2 tablets by mouth daily as needed for moderate constipation., Disp: , Rfl: ;  simethicone (GAS RELIEF EXTRA STRENGTH) 125 MG chewable tablet, Chew 125 mg by mouth every 6 (six) hours as needed for flatulence., Disp: , Rfl:   BP 158/58  Pulse 49  Resp 18  Ht 5\' 10"  (1.778 m)  Wt 186 lb 4.8 oz (84.505 kg)  BMI 26.73 kg/m2  Body mass index is 26.73 kg/(m^2).       Physical exam well-developed well-nourished gentleman appearing stated age in no acute distress Right neck incision is well-healed He has no bruits bilaterally 2+ radial pulses bilaterally He is grossly intact neurologically.  Carotid duplex today reveals widely patent endarterectomy on the  right and no significant left carotid stenosis  Impression and plan: Stable status post right carotid endarterectomy for symptomatic carotid disease and 2009. He has no evidence of recurrent disease. He will continue usual activities will see him again in one year with a carotid duplex

## 2014-03-01 NOTE — Addendum Note (Signed)
Addended by: Mena Goes on: 03/01/2014 01:44 PM   Modules accepted: Orders

## 2014-07-04 ENCOUNTER — Encounter: Payer: Self-pay | Admitting: Internal Medicine

## 2014-07-05 ENCOUNTER — Encounter: Payer: Self-pay | Admitting: Internal Medicine

## 2014-07-18 ENCOUNTER — Encounter: Payer: Self-pay | Admitting: Internal Medicine

## 2014-08-02 ENCOUNTER — Ambulatory Visit: Payer: Medicare Other | Admitting: Gastroenterology

## 2014-08-08 ENCOUNTER — Ambulatory Visit: Payer: Medicare Other | Admitting: Gastroenterology

## 2014-08-18 ENCOUNTER — Ambulatory Visit (INDEPENDENT_AMBULATORY_CARE_PROVIDER_SITE_OTHER): Payer: Medicare Other | Admitting: Gastroenterology

## 2014-08-18 ENCOUNTER — Encounter: Payer: Self-pay | Admitting: Gastroenterology

## 2014-08-18 VITALS — BP 162/68 | HR 68 | Temp 96.6°F | Ht 70.0 in | Wt 191.0 lb

## 2014-08-18 DIAGNOSIS — D539 Nutritional anemia, unspecified: Secondary | ICD-10-CM | POA: Insufficient documentation

## 2014-08-18 DIAGNOSIS — Z860101 Personal history of adenomatous and serrated colon polyps: Secondary | ICD-10-CM | POA: Insufficient documentation

## 2014-08-18 DIAGNOSIS — Z800 Family history of malignant neoplasm of digestive organs: Secondary | ICD-10-CM

## 2014-08-18 DIAGNOSIS — D649 Anemia, unspecified: Secondary | ICD-10-CM

## 2014-08-18 DIAGNOSIS — Z86010 Personal history of colonic polyps: Secondary | ICD-10-CM

## 2014-08-18 NOTE — Patient Instructions (Signed)
1. I will discuss you GI concerns with Dr. Gala Romney and we will let you know if he recommends one more colonoscopy. We will be in touch in a couple of days.  2. We will let you know if you need to complete the stool test provided with you today, based on Dr. Roseanne Kaufman recommendations.

## 2014-08-18 NOTE — Progress Notes (Signed)
Primary Care Physician:  Troy Noble, MD  Primary Gastroenterologist:  Troy Cornea, MD   Chief Complaint  Patient presents with  . Referral    HPI:  Troy Thompson is a 80 y.o. male here at the request of Troy Thompson for consideration of surveillance colonoscopy for personal history of tubular adenoma and family history of colon cancer (father in his late 62s).  Patient generally has a bowel movement about 5 days per week. Take stool softeners on occasion. Some mild change in bowel habits since his cholecystectomy in April. Bowel movements Troy Thompson before. Denies any melena, rectal bleeding, weight loss, abdominal pain, vomiting. His appetite is good.last colonoscopy in 2009, Troy Thompson adenoma removed.  Hgb 10.3 post-cholecystectomy. Now up to 12.1 in September.     Current Outpatient Prescriptions  Medication Sig Dispense Refill  . amLODipine (NORVASC) 5 MG tablet Take 5 mg by mouth daily.    Marland Kitchen aspirin 81 MG tablet Take 81 mg by mouth at bedtime.     . Multiple Vitamins-Minerals (PRESERVISION AREDS 2 PO) Take 1 tablet by mouth 2 (two) times daily.    Marland Kitchen omeprazole (PRILOSEC) 20 MG capsule 20 mg daily.    . simvastatin (ZOCOR) 20 MG tablet Take 20 mg by mouth at bedtime.     . valsartan-hydrochlorothiazide (DIOVAN-HCT) 320-25 MG per tablet Take 1 tablet by mouth every morning.      No current facility-administered medications for this visit.    Allergies as of 08/18/2014  . (No Known Allergies)    Past Medical History  Diagnosis Date  . Hypertension   . Carotid artery occlusion   . Retinal embolus, right 12/30/2007    Sudden onset of blindness   . Stroke   . Pneumonia     Past Surgical History  Procedure Laterality Date  . Joint replacement      knee  . Hernia repair    . Carotid endarterectomy  01/26/2008    Right   . Colonoscopy  03/15/2008    NIO:EVOJJK rectum/Left-sided transverse diverticula/Diminutive cecal polyp status post cold biopsy(Tubular adenoma)  .  Colonoscopy  09/24/2002    KXF:GHWEXH rectum/Left-sided diverticulum/Polyp at the splenic flexure removed   . Cholecystectomy N/A 01/17/2014    Procedure: LAPAROSCOPIC CHOLECYSTECTOMY;  Surgeon: Troy So, MD;  Location: [facility];  Service: General;  Laterality: N/A;  . Transurethral resection of prostate      Family History  Problem Relation Age of Onset  . Colon cancer Father     died at 61    History   Social History  . Marital Status: Married    Spouse Name: N/A    Number of Children: N/A  . Years of Education: N/A   Occupational History  . Not on file.   Social History Main Topics  . Smoking status: Former Smoker -- 22 years    Types: Cigarettes  . Smokeless tobacco: Never Used  . Alcohol Use: No  . Drug Use: No  . Sexual Activity: No   Other Topics Concern  . Not on file   Social History Narrative      ROS:  General: Negative for anorexia, weight loss, fever, chills, fatigue, weakness. Eyes: Negative for vision changes.  ENT: Negative for hoarseness, difficulty swallowing , nasal congestion. CV: Negative for chest pain, angina, palpitations, dyspnea on exertion, peripheral edema.  Respiratory: Negative for dyspnea at rest, dyspnea on exertion, cough, sputum, wheezing.  GI: See history of present illness. GU:  Negative for dysuria, hematuria, urinary  incontinence, urinary frequency, nocturnal urination.  MS: Negative for joint pain, low back pain.  Derm: Negative for rash or itching.  Neuro: Negative for weakness, abnormal sensation, seizure, frequent headaches, memory loss, confusion.  Psych: Negative for anxiety, depression, suicidal ideation, hallucinations.  Endo: Negative for unusual weight change.  Heme: Negative for bruising or bleeding. Allergy: Negative for rash or hives.    Physical Examination:  BP 162/68 mmHg  Pulse 68  Temp(Src) 96.6 F (35.9 C) (Oral)  Ht 5\' 10"  (1.778 m)  Wt 191 lb (86.637 kg)  BMI 27.41 kg/m2   General:  Well-nourished, well-developed in no acute distress.  Head: Normocephalic, atraumatic.   Eyes: Conjunctiva pink, no icterus. Mouth: Oropharyngeal mucosa moist and pink , no lesions erythema or exudate. Neck: Supple without thyromegaly, masses, or lymphadenopathy.  Lungs: Clear to auscultation bilaterally.  Heart: Regular rate and rhythm, no murmurs rubs or gallops.  Abdomen: Bowel sounds are normal, nontender, nondistended, no hepatosplenomegaly or masses, no abdominal bruits or    hernia , no rebound or guarding.   Rectal: not performed Extremities: No lower extremity edema. No clubbing or deformities.  Neuro: Alert and oriented x 4 , grossly normal neurologically.  Skin: Warm and dry, no rash or jaundice.   Psych: Alert and cooperative, normal mood and affect.  Labs: Labs from September 2015 Hemoglobin 12.1, hematocrit 36.6, MCV 89.9, platelets 170,000, total bilirubin 0.6, alkaline phosphatase 51, AST 16, ALT 10, albumin 4.3  Imaging Studies: No results found.

## 2014-08-19 ENCOUNTER — Encounter: Payer: Self-pay | Admitting: Gastroenterology

## 2014-08-19 ENCOUNTER — Ambulatory Visit (INDEPENDENT_AMBULATORY_CARE_PROVIDER_SITE_OTHER): Payer: Medicare Other | Admitting: *Deleted

## 2014-08-19 DIAGNOSIS — Z1211 Encounter for screening for malignant neoplasm of colon: Secondary | ICD-10-CM

## 2014-08-19 LAB — IFOBT (OCCULT BLOOD): IFOBT: NEGATIVE

## 2014-08-19 NOTE — Progress Notes (Signed)
Patient ID: Troy Thompson, male   DOB: December 01, 1924, 80 y.o.   MRN: [id number] IFOBT TEST: (-) Negative

## 2014-08-19 NOTE — Assessment & Plan Note (Signed)
80 year old gentleman with history of adenomatous colonic polyps, family history of colon cancer (father and his eighth decade), mild anemia who presents to consider colonoscopy. Patient currently is unsure whether or not he would like to proceed with colonoscopy given his age. Guidelines or not clear regarding surveillance colonoscopies past eighth decade. I have discussed case with Dr. Gala Romney. Initially obtain Hemoccult. Patient in agreement with this plan. Based on Hemoccult findings, may offer colonoscopy in the near future. Further recommendations to follow.

## 2014-08-31 NOTE — Progress Notes (Signed)
CC'ED TO PCP 

## 2014-09-05 ENCOUNTER — Telehealth: Payer: Self-pay | Admitting: Internal Medicine

## 2014-09-05 DIAGNOSIS — D649 Anemia, unspecified: Secondary | ICD-10-CM

## 2014-09-05 NOTE — Telephone Encounter (Signed)
Pt called this morning saying he hasn't heard back from [place] regarding his stool results. Please advise and call him [phone]. I told him that [facility] nurse isn't here today, but I would see if another nurse could call him back or it may be tomorrow when JL gets back. Patient agreed.

## 2014-09-06 NOTE — Telephone Encounter (Signed)
I talked to the pt- informed him that ifobt was negative and if LSL has any further recommendations that I would let him know. He said that was fine.

## 2014-09-07 NOTE — Telephone Encounter (Signed)
Please let patient know that we need to recheck his H/H now. Was still mildly anemic in 06/2014 although improved since his gb surgery.  If his H/H come back normal, I will touch base with Dr. Willey Blade and let him know that TCS not indicated but if H/H still low, then we may need to consider TCS.

## 2014-09-13 NOTE — Telephone Encounter (Signed)
Pt is aware and lab order has been faxed to the lab.

## 2014-09-13 NOTE — Telephone Encounter (Signed)
What is status? 

## 2014-09-15 LAB — CBC WITH DIFFERENTIAL/PLATELET
BASOS PCT: 1 % (ref 0–1)
Basophils Absolute: 0.1 10*3/uL (ref 0.0–0.1)
EOS ABS: 0.2 10*3/uL (ref 0.0–0.7)
EOS PCT: 3 % (ref 0–5)
HEMATOCRIT: 36 % — AB (ref 39.0–52.0)
Hemoglobin: 12.3 g/dL — ABNORMAL LOW (ref 13.0–17.0)
Lymphocytes Relative: 31 % (ref 12–46)
Lymphs Abs: 1.9 10*3/uL (ref 0.7–4.0)
MCH: 29.9 pg (ref 26.0–34.0)
MCHC: 34.2 g/dL (ref 30.0–36.0)
MCV: 87.6 fL (ref 78.0–100.0)
MONO ABS: 0.6 10*3/uL (ref 0.1–1.0)
MPV: 10.9 fL (ref 9.4–12.4)
Monocytes Relative: 10 % (ref 3–12)
Neutro Abs: 3.4 10*3/uL (ref 1.7–7.7)
Neutrophils Relative %: 55 % (ref 43–77)
Platelets: 204 10*3/uL (ref 150–400)
RBC: 4.11 MIL/uL — ABNORMAL LOW (ref 4.22–5.81)
RDW: 14.5 % (ref 11.5–15.5)
WBC: 6.2 10*3/uL (ref 4.0–10.5)

## 2014-09-20 ENCOUNTER — Other Ambulatory Visit: Payer: Self-pay | Admitting: Gastroenterology

## 2014-09-20 DIAGNOSIS — D649 Anemia, unspecified: Secondary | ICD-10-CM

## 2014-09-20 NOTE — Telephone Encounter (Signed)
Quick Note:  Spoke with Dr. Willey Blade and Dr. Gala Romney. Patient has h/o colon polyps, FH of CRC father in his 8th decade and surveillance colonoscopy not recommended given patient's age. Patient reluctant to colonoscopy when he was in office. Dr. Willey Blade agrees with holding off on surveillance colonoscopy. However, patient has h/o anemia (improved since gb surgery in 01/2014) and we will recheck CBC in 11/2014. If anemia persistent, then reconsider work up at that time. Discussed at length with patient's wife, Vira Agar, as patient was not available. I did request patient call if further questions.   Almyra Free, Repeat CBC in 11/2014. ______

## 2014-10-31 ENCOUNTER — Other Ambulatory Visit: Payer: Self-pay

## 2014-10-31 DIAGNOSIS — D649 Anemia, unspecified: Secondary | ICD-10-CM

## 2014-11-02 ENCOUNTER — Telehealth: Payer: Self-pay | Admitting: Internal Medicine

## 2014-11-02 NOTE — Telephone Encounter (Signed)
I spoke with the pt and explained why he needed to repeat the labs. He verbalized understanding and said he would have them done.

## 2014-11-02 NOTE — Telephone Encounter (Signed)
Pt received a letter from the nurse that it was time for him to have labs done on 11/16/14. Patient doesn't understand why he needs to have labs done and wanted to speak with the nurse. Please call him at [phone]

## 2014-11-15 LAB — CBC WITH DIFFERENTIAL/PLATELET
BASOS PCT: 1 % (ref 0–1)
Basophils Absolute: 0.1 10*3/uL (ref 0.0–0.1)
EOS ABS: 0.3 10*3/uL (ref 0.0–0.7)
Eosinophils Relative: 4 % (ref 0–5)
HCT: 38.5 % — ABNORMAL LOW (ref 39.0–52.0)
Hemoglobin: 12.7 g/dL — ABNORMAL LOW (ref 13.0–17.0)
Lymphocytes Relative: 33 % (ref 12–46)
Lymphs Abs: 2.1 10*3/uL (ref 0.7–4.0)
MCH: 29.8 pg (ref 26.0–34.0)
MCHC: 33 g/dL (ref 30.0–36.0)
MCV: 90.4 fL (ref 78.0–100.0)
MPV: 10.6 fL (ref 8.6–12.4)
Monocytes Absolute: 0.5 10*3/uL (ref 0.1–1.0)
Monocytes Relative: 8 % (ref 3–12)
Neutro Abs: 3.5 10*3/uL (ref 1.7–7.7)
Neutrophils Relative %: 54 % (ref 43–77)
Platelets: 174 10*3/uL (ref 150–400)
RBC: 4.26 MIL/uL (ref 4.22–5.81)
RDW: 14.3 % (ref 11.5–15.5)
WBC: 6.4 10*3/uL (ref 4.0–10.5)

## 2014-11-22 NOTE — Progress Notes (Signed)
Quick Note:  Please let patient know his Hgb is near normal.  Would consider repeat once more in 4 months. ______

## 2014-11-23 ENCOUNTER — Other Ambulatory Visit: Payer: Self-pay | Admitting: Gastroenterology

## 2014-11-23 DIAGNOSIS — D649 Anemia, unspecified: Secondary | ICD-10-CM

## 2015-01-09 ENCOUNTER — Ambulatory Visit (INDEPENDENT_AMBULATORY_CARE_PROVIDER_SITE_OTHER): Payer: PPO | Admitting: Ophthalmology

## 2015-01-09 DIAGNOSIS — H35033 Hypertensive retinopathy, bilateral: Secondary | ICD-10-CM | POA: Diagnosis not present

## 2015-01-09 DIAGNOSIS — H3531 Nonexudative age-related macular degeneration: Secondary | ICD-10-CM

## 2015-01-09 DIAGNOSIS — H3411 Central retinal artery occlusion, right eye: Secondary | ICD-10-CM

## 2015-01-09 DIAGNOSIS — I10 Essential (primary) hypertension: Secondary | ICD-10-CM | POA: Diagnosis not present

## 2015-01-09 DIAGNOSIS — H43813 Vitreous degeneration, bilateral: Secondary | ICD-10-CM | POA: Diagnosis not present

## 2015-01-09 DIAGNOSIS — H35372 Puckering of macula, left eye: Secondary | ICD-10-CM

## 2015-02-27 ENCOUNTER — Other Ambulatory Visit: Payer: Self-pay

## 2015-02-27 DIAGNOSIS — D649 Anemia, unspecified: Secondary | ICD-10-CM

## 2015-03-07 ENCOUNTER — Other Ambulatory Visit (HOSPITAL_COMMUNITY): Payer: Medicare Other

## 2015-03-07 ENCOUNTER — Ambulatory Visit: Payer: Medicare Other | Admitting: Family

## 2015-03-20 ENCOUNTER — Encounter: Payer: Self-pay | Admitting: Vascular Surgery

## 2015-03-21 LAB — CBC WITH DIFFERENTIAL/PLATELET
Basophils Absolute: 0.1 10*3/uL (ref 0.0–0.1)
Basophils Relative: 1 % (ref 0–1)
Eosinophils Absolute: 0.2 10*3/uL (ref 0.0–0.7)
Eosinophils Relative: 3 % (ref 0–5)
HEMATOCRIT: 37.7 % — AB (ref 39.0–52.0)
HEMOGLOBIN: 12.5 g/dL — AB (ref 13.0–17.0)
LYMPHS ABS: 1.9 10*3/uL (ref 0.7–4.0)
LYMPHS PCT: 29 % (ref 12–46)
MCH: 30 pg (ref 26.0–34.0)
MCHC: 33.2 g/dL (ref 30.0–36.0)
MCV: 90.4 fL (ref 78.0–100.0)
MONOS PCT: 11 % (ref 3–12)
MPV: 11.1 fL (ref 8.6–12.4)
Monocytes Absolute: 0.7 10*3/uL (ref 0.1–1.0)
NEUTROS ABS: 3.6 10*3/uL (ref 1.7–7.7)
Neutrophils Relative %: 56 % (ref 43–77)
Platelets: 201 10*3/uL (ref 150–400)
RBC: 4.17 MIL/uL — ABNORMAL LOW (ref 4.22–5.81)
RDW: 14.8 % (ref 11.5–15.5)
WBC: 6.5 10*3/uL (ref 4.0–10.5)

## 2015-03-21 NOTE — Progress Notes (Signed)
Quick Note:  Persistent but mild anemia. Recommend at least one follow up with Dr. Gala Romney to determine if further evaluation needed. ______

## 2015-03-23 ENCOUNTER — Ambulatory Visit (HOSPITAL_COMMUNITY)
Admission: RE | Admit: 2015-03-23 | Discharge: 2015-03-23 | Disposition: A | Discharge status: Home / Self Care | Payer: PPO | Source: Ambulatory Visit | Attending: Family | Admitting: Family

## 2015-03-23 ENCOUNTER — Ambulatory Visit (INDEPENDENT_AMBULATORY_CARE_PROVIDER_SITE_OTHER): Payer: PPO | Admitting: Family

## 2015-03-23 ENCOUNTER — Encounter: Payer: Self-pay | Admitting: Family

## 2015-03-23 VITALS — BP 137/63 | HR 52 | Resp 14 | Ht 70.0 in | Wt 190.0 lb

## 2015-03-23 DIAGNOSIS — Z48812 Encounter for surgical aftercare following surgery on the circulatory system: Secondary | ICD-10-CM

## 2015-03-23 DIAGNOSIS — Z9889 Other specified postprocedural states: Secondary | ICD-10-CM | POA: Diagnosis not present

## 2015-03-23 DIAGNOSIS — I6523 Occlusion and stenosis of bilateral carotid arteries: Secondary | ICD-10-CM | POA: Diagnosis present

## 2015-03-23 DIAGNOSIS — I6522 Occlusion and stenosis of left carotid artery: Secondary | ICD-10-CM | POA: Insufficient documentation

## 2015-03-23 NOTE — Patient Instructions (Signed)
Stroke Prevention Some medical conditions and behaviors are associated with an increased chance of having a stroke. You may prevent a stroke by making healthy choices and managing medical conditions. HOW CAN I REDUCE MY RISK OF HAVING A STROKE?   Stay physically active. Get at least 30 minutes of activity on most or all days.  Do not smoke. It may also be helpful to avoid exposure to secondhand smoke.  Limit alcohol use. Moderate alcohol use is considered to be:  No more than 2 drinks per day for men.  No more than 1 drink per day for nonpregnant women.  Eat healthy foods. This involves:  Eating 5 or more servings of fruits and vegetables a day.  Making dietary changes that address high blood pressure (hypertension), high cholesterol, diabetes, or obesity.  Manage your cholesterol levels.  Making food choices that are high in fiber and low in saturated fat, trans fat, and cholesterol may control cholesterol levels.  Take any prescribed medicines to control cholesterol as directed by your health care provider.  Manage your diabetes.  Controlling your carbohydrate and sugar intake is recommended to manage diabetes.  Take any prescribed medicines to control diabetes as directed by your health care provider.  Control your hypertension.  Making food choices that are low in salt (sodium), saturated fat, trans fat, and cholesterol is recommended to manage hypertension.  Take any prescribed medicines to control hypertension as directed by your health care provider.  Maintain a healthy weight.  Reducing calorie intake and making food choices that are low in sodium, saturated fat, trans fat, and cholesterol are recommended to manage weight.  Stop drug abuse.  Avoid taking birth control pills.  Talk to your health care provider about the risks of taking birth control pills if you are over 35 years old, smoke, get migraines, or have ever had a blood clot.  Get evaluated for sleep  disorders (sleep apnea).  Talk to your health care provider about getting a sleep evaluation if you snore a lot or have excessive sleepiness.  Take medicines only as directed by your health care provider.  For some people, aspirin or blood thinners (anticoagulants) are helpful in reducing the risk of forming abnormal blood clots that can lead to stroke. If you have the irregular heart rhythm of atrial fibrillation, you should be on a blood thinner unless there is a good reason you cannot take them.  Understand all your medicine instructions.  Make sure that other conditions (such as anemia or atherosclerosis) are addressed. SEEK IMMEDIATE MEDICAL CARE IF:   You have sudden weakness or numbness of the face, arm, or leg, especially on one side of the body.  Your face or eyelid droops to one side.  You have sudden confusion.  You have trouble speaking (aphasia) or understanding.  You have sudden trouble seeing in one or both eyes.  You have sudden trouble walking.  You have dizziness.  You have a loss of balance or coordination.  You have a sudden, severe headache with no known cause.  You have new chest pain or an irregular heartbeat. Any of these symptoms may represent a serious problem that is an emergency. Do not wait to see if the symptoms will go away. Get medical help at once. Call your local emergency services (911 in [place] Do not drive yourself to the hospital. Document Released: 10/31/2004 Document Revised: 02/07/2014 Document Reviewed: 03/26/2013 ExitCare Patient Information 2015 [employer]. This information is not intended to replace advice given   to you by your health care provider. Make sure you discuss any questions you have with your health care provider.  

## 2015-03-23 NOTE — Progress Notes (Signed)
Established Carotid Patient   History of Present Illness  Troy Thompson is a 81 y.o. male patient of Dr. Donnetta Hutching who is s/p right carotid endarterectomy in 2009. He is here today for his yearly carotid surveillance.  His initial surgery was for right retinal embolus. He did not have a high-grade stenosis but did have a very irregular ulcerative plaque has had no further visual changes since his surgery in 2009. He has limited vision in his right eye. He has had no subsequent strokes or TIA's.   The patient denies any history of unilateral facial drooping, denies a history of hemiplegia, and denies a history of receptive or expressive aphasia.    The patient denies New Medical or Surgical History.  [facility]   Pt Diabetic: no Pt smoker: former smoker, quit in the 1950's  Pt meds include: Statin : yes ASA: yes Other anticoagulants/antiplatelets: no   Past Medical History  Diagnosis Date  . Hypertension   . Carotid artery occlusion   . Retinal embolus, right 12/30/2007    Sudden onset of blindness   . Stroke   . Pneumonia     Social History History  Substance Use Topics  . Smoking status: Former Smoker -- 22 years    Types: Cigarettes  . Smokeless tobacco: Never Used  . Alcohol Use: No    Family History Family History  Problem Relation Age of Onset  . Colon cancer Father     died at 57  . Cancer Father     Colon  . Heart attack Mother   . Hypertension Daughter   . Diabetes Son     Surgical History Past Surgical History  Procedure Laterality Date  . Hernia repair    . Carotid endarterectomy  01/26/2008    Right   . Colonoscopy  03/15/2008    UMP:NTIRWE rectum/Left-sided transverse diverticula/Diminutive cecal polyp status post cold biopsy(Tubular adenoma)  . Colonoscopy  09/24/2002    RXV:[id number] rectum/Left-sided diverticulum/Polyp at the splenic flexure removed   . Cholecystectomy N/A 01/17/2014    Procedure: LAPAROSCOPIC  CHOLECYSTECTOMY;  Surgeon: Jamesetta So, MD;  Location: [facility];  Service: General;  Laterality: N/A;  . Transurethral resection of prostate    . Joint replacement Right 2005    knee  . Prostate surgery      30 yrs ago    No Known Allergies  Current Outpatient Prescriptions  Medication Sig Dispense Refill  . amLODipine (NORVASC) 5 MG tablet Take 5 mg by mouth daily.    Marland Kitchen aspirin 81 MG tablet Take 81 mg by mouth at bedtime.     . Multiple Vitamins-Minerals (PRESERVISION AREDS 2 PO) Take 1 tablet by mouth 2 (two) times daily.    Marland Kitchen omeprazole (PRILOSEC) 20 MG capsule 20 mg daily.    . simvastatin (ZOCOR) 20 MG tablet Take 20 mg by mouth at bedtime.     . valsartan-hydrochlorothiazide (DIOVAN-HCT) 320-25 MG per tablet Take 1 tablet by mouth every morning.      No current facility-administered medications for this visit.    Review of Systems : See HPI for pertinent positives and negatives.  Physical Examination  Filed Vitals:   03/23/15 1134 03/23/15 1137  BP: 152/69 137/63  Pulse: 52 52  Resp:  14  Height:  5\' 10"  (1.778 m)  Weight:  190 lb (86.183 kg)  SpO2:  98%   Body mass index is 27.26 kg/(m^2).  General: WDWN male in NAD GAIT: normal  Eyes: PERRLA Pulmonary:  Non-labored, CTAB, Negative  Rales, Negative rhonchi, & Negative wheezing.  Cardiac: regular Rhythm, no detected murmur.  VASCULAR EXAM Carotid Bruits Right Left   Negative Negative    Aorta is not palpable. Radial pulses are 2+ palpable and equal.                                                                                                                            LE Pulses Right Left       POPLITEAL  not palpable   not palpable    Gastrointestinal: soft, nontender, BS WNL, no r/g,  negative palpated masses.  Musculoskeletal: Negative muscle atrophy/wasting. M/S 5/5 throughout, Extremities without ischemic changes.  Neurologic: A&O X 3; Appropriate Affect;  Speech is normal CN 2-12 intact, Pain  and light touch intact in extremities, Motor exam as listed above.   Non-Invasive Vascular Imaging CAROTID DUPLEX 03/23/2015   CEREBROVASCULAR DUPLEX EVALUATION    INDICATION: Follow-up carotid disease     PREVIOUS INTERVENTION(S): Right carotid endarterectomy 01/26/2008    DUPLEX EXAM:     RIGHT  LEFT  Peak Systolic Velocities (cm/s) End Diastolic Velocities (cm/s) Plaque LOCATION Peak Systolic Velocities (cm/s) End Diastolic Velocities (cm/s) Plaque  100 13  CCA PROXIMAL 93 19   77 13  CCA MID 136 21   88 12  CCA DISTAL 135 20   147 0  ECA 217 2   90 21  ICA PROXIMAL 198 30   115 20  ICA MID 102 24   120 24  ICA DISTAL 104 24     NA ICA / CCA Ratio (PSV) 1.5  Antegrade  Vertebral Flow Antegrade    Brachial Systolic Pressure (mmHg)   Within normal limits  Brachial Artery Waveforms Within normal limits     Plaque Morphology:  HM = Homogeneous, HT = Heterogeneous, CP = Calcific Plaque, SP = Smooth Plaque, IP = Irregular Plaque  ADDITIONAL FINDINGS:     IMPRESSION: 1. Widely patent right carotid endarterectomy without evidence of restenosis or hyperplasia.  2. Evidence of <40% stenosis of the left internal carotid artery. 3. Bilateral vertebral artery is antegrade.    Compared to the previous exam:  No significant change compared to prior exam.       Assessment: [name] is a 81 y.o. male who is s/p right carotid endarterectomy in 2009 after a right eye embolic stroke, no subsequent strokes or TIA's. Today's carotid Duplex suggests a widely patent right carotid endarterectomy without evidence of restenosis or hyperplasia and minimal left ICA stenosis. No significant change compared to prior exam.   Plan: Follow-up in 1 year with Carotid Duplex.   I discussed in depth with the patient the nature of atherosclerosis, and emphasized the importance of maximal medical management including strict control of blood pressure, blood glucose, and lipid levels, obtaining  regular exercise, and continued cessation of smoking.  The patient is aware that without maximal medical  management the underlying atherosclerotic disease process will progress, limiting the benefit of any interventions. The patient was given information about stroke prevention and what symptoms should prompt the patient to seek immediate medical care. Thank you for allowing [place] to participate in this patient's care.  Clemon Chambers, RN, MSN, FNP-C [facility] Office: [phone]  Clinic Physician: Scot Dock  03/23/2015 11:47 AM

## 2015-04-18 ENCOUNTER — Encounter: Payer: Self-pay | Admitting: Internal Medicine

## 2015-04-18 ENCOUNTER — Ambulatory Visit (INDEPENDENT_AMBULATORY_CARE_PROVIDER_SITE_OTHER): Payer: PPO | Admitting: Internal Medicine

## 2015-04-18 VITALS — BP 149/62 | HR 83 | Temp 97.6°F | Ht 70.0 in | Wt 190.6 lb

## 2015-04-18 DIAGNOSIS — Z860100 Personal history of colon polyps, unspecified: Secondary | ICD-10-CM

## 2015-04-18 NOTE — Patient Instructions (Signed)
Follow-up with Dr. Willey Blade - discuss getting one more stool test for occult blood - if positive, would consider a colonoscopy  It OK with me if your stool is not checked for blood.  Of course, if you develop NEW symptoms in the future, plans could change  Follow-up with me as needed

## 2015-04-18 NOTE — Progress Notes (Signed)
Primary Care Physician:  Troy Noble, MD Primary Gastroenterologist:  Dr. Gala Romney  Pre-Procedure History & Physical: HPI:  Troy Thompson is a 81 y.o. male here for followup of anemia which haas rebounded somewhat since cholecystectomy. Aside from occasional constipation, he has no bowel symptoms. No gross bleeding or melena. Denies abdominal pain or any upper GI tract symptoms such as odynophagia, dysphagia, early satiety nausea or vomiting.  Most recent H&H 12.5/37.7 This is a very active 62, soon to be 81 year old, gentleman. He exercises at the [facility] 3 times weekly. He is an avid gardener. Last colonoscopy 2009 - adenoma removed.  Past Medical History  Diagnosis Date  . Hypertension   . Carotid artery occlusion   . Retinal embolus, right 12/30/2007    Sudden onset of blindness   . Stroke   . Pneumonia     Past Surgical History  Procedure Laterality Date  . Hernia repair    . Carotid endarterectomy  01/26/2008    Right   . Colonoscopy  03/15/2008    WEX:HBZJIR rectum/Left-sided transverse diverticula/Diminutive cecal polyp status post cold biopsy(Tubular adenoma)  . Colonoscopy  09/24/2002    CVE:LFYBOF rectum/Left-sided diverticulum/Polyp at the splenic flexure removed   . Cholecystectomy N/A 01/17/2014    Procedure: LAPAROSCOPIC CHOLECYSTECTOMY;  Surgeon: Jamesetta So, MD;  Location: [facility];  Service: General;  Laterality: N/A;  . Transurethral resection of prostate    . Joint replacement Right 2005    knee  . Prostate surgery      30 yrs ago    Prior to Admission medications   Medication Sig Start Date End Date Taking? Authorizing Provider  amLODipine (NORVASC) 5 MG tablet Take 5 mg by mouth daily.   Yes Historical Provider, MD  aspirin 81 MG tablet Take 81 mg by mouth at bedtime.    Yes Historical Provider, MD  Multiple Vitamins-Minerals (PRESERVISION AREDS 2 PO) Take 1 tablet by mouth 2 (two) times daily.   Yes Historical Provider, MD  omeprazole (PRILOSEC) 20 MG  capsule 20 mg daily. 02/25/14  Yes Historical Provider, MD  simvastatin (ZOCOR) 20 MG tablet Take 20 mg by mouth at bedtime.  02/04/12  Yes Historical Provider, MD  valsartan-hydrochlorothiazide (DIOVAN-HCT) 320-25 MG per tablet Take 1 tablet by mouth every morning.    Yes Historical Provider, MD    Allergies as of 04/18/2015  . (No Known Allergies)    Family History  Problem Relation Age of Onset  . Colon cancer Father     died at 92  . Cancer Father     Colon  . Heart attack Mother   . Hypertension Daughter   . Diabetes Son     History   Social History  . Marital Status: Married    Spouse Name: N/A  . Number of Children: N/A  . Years of Education: N/A   Occupational History  . Not on file.   Social History Main Topics  . Smoking status: Former Smoker -- 22 years    Types: Cigarettes  . Smokeless tobacco: Never Used  . Alcohol Use: No  . Drug Use: No  . Sexual Activity: No   Other Topics Concern  . Not on file   Social History Narrative    Review of Systems: See HPI, otherwise negative ROS  Physical Exam: BP 149/62 mmHg  Pulse 83  Temp(Src) 97.6 F (36.4 C) (Oral)  Ht 5\' 10"  (1.778 m)  Wt 190 lb 9.6 oz (86.456 kg)  BMI 27.35 kg/m2  General:   Alert, elderly, pleasant and cooperative in NAD Skin:  Intact without significant lesions or rashes. Eyes:  Sclera clear, no icterus.   Conjunctiva pink. Ears:  Normal auditory acuity. Nose:  No deformity, discharge,  or lesions. Mouth:  No deformity or lesions. Neck:  Supple; no masses or thyromegaly. No significant cervical adenopathy. Lungs:  Clear throughout to auscultation.   No wheezes, crackles, or rhonchi. No acute distress. Heart:  Regular rate and rhythm; no murmurs, clicks, rubs,  or gallops. Abdomen: Non-distended, normal bowel sounds.  Soft and nontender without appreciable mass or hepatosplenomegaly.  Pulses:  Normal pulses noted. Extremities:  Without clubbing or edema.  Impression:  Pleasant  81 year old gentleman with mild anemia.  Aside from Constipation, He Is devoid of Any GI Symptoms. Personal History of Adenoma in the Past. And Positive Family History of Colon Cancer in His Father but at an Advanced Age.  Mild anemia is entirely nonspecific.  We Had a Discussion about Current Surveillance/Screening Recommendations Given History of Colonic Adenoma. Positive Family History of Colon Cancer Is Discounted Because of family member Developing Cancer at an Advanced Age.  I do not recommend a Colonoscopy at this time.  Recommendations: We Mutually Agreed for the Patient to Touch Base with Dr. Willey Blade at His Next Routine Appointment and have a discussion with him. If the Anything Is Done,  Could consider checking stool for occult blood (would do this on a sample obtained at home and not on digital rectal exam and send it for immuno-fecal occult blood testing). I would only do this if we were willing to act on it. Otherwise, would consider doing no further GI evaluation. Of course, should the patient develop new GI symptoms plans could change.  No further scheduled follow-up at This time although I would be glad to see back if needed.      Notice: This dictation was prepared with Dragon dictation along with smaller phrase technology. Any transcriptional errors that result from this process are unintentional and may not be corrected upon review.

## 2015-10-23 ENCOUNTER — Other Ambulatory Visit (HOSPITAL_COMMUNITY): Payer: Self-pay | Admitting: Internal Medicine

## 2015-10-23 ENCOUNTER — Ambulatory Visit (HOSPITAL_COMMUNITY)
Admission: RE | Admit: 2015-10-23 | Discharge: 2015-10-23 | Disposition: A | Discharge status: Home / Self Care | Payer: PPO | Source: Ambulatory Visit | Attending: Internal Medicine | Admitting: Internal Medicine

## 2015-10-23 DIAGNOSIS — R0602 Shortness of breath: Secondary | ICD-10-CM | POA: Insufficient documentation

## 2015-10-23 DIAGNOSIS — K219 Gastro-esophageal reflux disease without esophagitis: Secondary | ICD-10-CM | POA: Diagnosis not present

## 2015-10-23 DIAGNOSIS — K449 Diaphragmatic hernia without obstruction or gangrene: Secondary | ICD-10-CM | POA: Diagnosis not present

## 2015-10-23 DIAGNOSIS — Z6830 Body mass index (BMI) 30.0-30.9, adult: Secondary | ICD-10-CM | POA: Diagnosis not present

## 2015-11-13 DIAGNOSIS — Z6830 Body mass index (BMI) 30.0-30.9, adult: Secondary | ICD-10-CM | POA: Diagnosis not present

## 2015-11-13 DIAGNOSIS — R0602 Shortness of breath: Secondary | ICD-10-CM | POA: Diagnosis not present

## 2015-11-13 DIAGNOSIS — I10 Essential (primary) hypertension: Secondary | ICD-10-CM | POA: Diagnosis not present

## 2015-11-13 DIAGNOSIS — K219 Gastro-esophageal reflux disease without esophagitis: Secondary | ICD-10-CM | POA: Diagnosis not present

## 2016-01-09 ENCOUNTER — Ambulatory Visit (INDEPENDENT_AMBULATORY_CARE_PROVIDER_SITE_OTHER): Payer: PPO | Admitting: Ophthalmology

## 2016-03-14 DIAGNOSIS — I10 Essential (primary) hypertension: Secondary | ICD-10-CM | POA: Diagnosis not present

## 2016-03-14 DIAGNOSIS — K219 Gastro-esophageal reflux disease without esophagitis: Secondary | ICD-10-CM | POA: Diagnosis not present

## 2016-03-26 ENCOUNTER — Ambulatory Visit: Payer: PPO | Admitting: Family

## 2016-03-26 ENCOUNTER — Encounter (HOSPITAL_COMMUNITY): Payer: PPO

## 2016-05-01 DIAGNOSIS — Z471 Aftercare following joint replacement surgery: Secondary | ICD-10-CM | POA: Diagnosis not present

## 2016-05-01 DIAGNOSIS — Z96651 Presence of right artificial knee joint: Secondary | ICD-10-CM | POA: Diagnosis not present

## 2016-05-01 DIAGNOSIS — M7122 Synovial cyst of popliteal space [Baker], left knee: Secondary | ICD-10-CM | POA: Diagnosis not present

## 2016-05-01 DIAGNOSIS — M1712 Unilateral primary osteoarthritis, left knee: Secondary | ICD-10-CM | POA: Diagnosis not present

## 2016-05-03 ENCOUNTER — Encounter: Payer: Self-pay | Admitting: Family

## 2016-05-08 ENCOUNTER — Other Ambulatory Visit: Payer: Self-pay | Admitting: *Deleted

## 2016-05-08 DIAGNOSIS — I6523 Occlusion and stenosis of bilateral carotid arteries: Secondary | ICD-10-CM

## 2016-05-08 DIAGNOSIS — Z48812 Encounter for surgical aftercare following surgery on the circulatory system: Secondary | ICD-10-CM

## 2016-05-10 ENCOUNTER — Ambulatory Visit (INDEPENDENT_AMBULATORY_CARE_PROVIDER_SITE_OTHER): Payer: PPO | Admitting: Family

## 2016-05-10 ENCOUNTER — Ambulatory Visit (HOSPITAL_COMMUNITY)
Admission: RE | Admit: 2016-05-10 | Discharge: 2016-05-10 | Disposition: A | Discharge status: Home / Self Care | Payer: PPO | Source: Ambulatory Visit | Attending: Family | Admitting: Family

## 2016-05-10 ENCOUNTER — Encounter: Payer: Self-pay | Admitting: Family

## 2016-05-10 VITALS — BP 167/70 | HR 60 | Temp 97.0°F | Resp 16 | Ht 70.0 in | Wt 187.0 lb

## 2016-05-10 DIAGNOSIS — Z48812 Encounter for surgical aftercare following surgery on the circulatory system: Secondary | ICD-10-CM | POA: Diagnosis not present

## 2016-05-10 DIAGNOSIS — I6523 Occlusion and stenosis of bilateral carotid arteries: Secondary | ICD-10-CM | POA: Diagnosis not present

## 2016-05-10 DIAGNOSIS — Z9889 Other specified postprocedural states: Secondary | ICD-10-CM

## 2016-05-10 DIAGNOSIS — I10 Essential (primary) hypertension: Secondary | ICD-10-CM | POA: Insufficient documentation

## 2016-05-10 DIAGNOSIS — I6522 Occlusion and stenosis of left carotid artery: Secondary | ICD-10-CM | POA: Insufficient documentation

## 2016-05-10 LAB — VAS US CAROTID
LCCADSYS: -115 cm/s
LEFT ECA DIAS: -16 cm/s
LEFT VERTEBRAL DIAS: -10 cm/s
LICADDIAS: -24 cm/s
Left CCA dist dias: -18 cm/s
Left CCA prox dias: 15 cm/s
Left CCA prox sys: 96 cm/s
Left ICA dist sys: -108 cm/s
RCCADSYS: -104 cm/s
RIGHT CCA MID DIAS: 9 cm/s
RIGHT ECA DIAS: -5 cm/s
Right CCA prox dias: 9 cm/s
Right CCA prox sys: 97 cm/s

## 2016-05-10 NOTE — Patient Instructions (Signed)
Stroke Prevention Some medical conditions and behaviors are associated with an increased chance of having a stroke. You may prevent a stroke by making healthy choices and managing medical conditions. HOW CAN I REDUCE MY RISK OF HAVING A STROKE?   Stay physically active. Get at least 30 minutes of activity on most or all days.  Do not smoke. It may also be helpful to avoid exposure to secondhand smoke.  Limit alcohol use. Moderate alcohol use is considered to be:  No more than 2 drinks per day for men.  No more than 1 drink per day for nonpregnant women.  Eat healthy foods. This involves:  Eating 5 or more servings of fruits and vegetables a day.  Making dietary changes that address high blood pressure (hypertension), high cholesterol, diabetes, or obesity.  Manage your cholesterol levels.  Making food choices that are high in fiber and low in saturated fat, trans fat, and cholesterol may control cholesterol levels.  Take any prescribed medicines to control cholesterol as directed by your health care provider.  Manage your diabetes.  Controlling your carbohydrate and sugar intake is recommended to manage diabetes.  Take any prescribed medicines to control diabetes as directed by your health care provider.  Control your hypertension.  Making food choices that are low in salt (sodium), saturated fat, trans fat, and cholesterol is recommended to manage hypertension.  Ask your health care provider if you need treatment to lower your blood pressure. Take any prescribed medicines to control hypertension as directed by your health care provider.  If you are 18-39 years of age, have your blood pressure checked every 3-5 years. If you are 40 years of age or older, have your blood pressure checked every year.  Maintain a healthy weight.  Reducing calorie intake and making food choices that are low in sodium, saturated fat, trans fat, and cholesterol are recommended to manage  weight.  Stop drug abuse.  Avoid taking birth control pills.  Talk to your health care provider about the risks of taking birth control pills if you are over 35 years old, smoke, get migraines, or have ever had a blood clot.  Get evaluated for sleep disorders (sleep apnea).  Talk to your health care provider about getting a sleep evaluation if you snore a lot or have excessive sleepiness.  Take medicines only as directed by your health care provider.  For some people, aspirin or blood thinners (anticoagulants) are helpful in reducing the risk of forming abnormal blood clots that can lead to stroke. If you have the irregular heart rhythm of atrial fibrillation, you should be on a blood thinner unless there is a good reason you cannot take them.  Understand all your medicine instructions.  Make sure that other conditions (such as anemia or atherosclerosis) are addressed. SEEK IMMEDIATE MEDICAL CARE IF:   You have sudden weakness or numbness of the face, arm, or leg, especially on one side of the body.  Your face or eyelid droops to one side.  You have sudden confusion.  You have trouble speaking (aphasia) or understanding.  You have sudden trouble seeing in one or both eyes.  You have sudden trouble walking.  You have dizziness.  You have a loss of balance or coordination.  You have a sudden, severe headache with no known cause.  You have new chest pain or an irregular heartbeat. Any of these symptoms may represent a serious problem that is an emergency. Do not wait to see if the symptoms will   go away. Get medical help at once. Call your local emergency services (911 in [place] Do not drive yourself to the hospital.   This information is not intended to replace advice given to you by your health care provider. Make sure you discuss any questions you have with your health care provider.   Document Released: 10/31/2004 Document Revised: 10/14/2014 Document Reviewed:  03/26/2013 Elsevier Interactive Patient Education 2016 Elsevier Inc.  

## 2016-05-10 NOTE — Progress Notes (Signed)
Chief Complaint: Follow up Extracranial Carotid Artery Stenosis   History of Present Illness  Troy Thompson is a 82 y.o. male patient of Dr. Donnetta Hutching who is s/p right carotid endarterectomy in 2009. He is here today for his yearly carotid surveillance.  His initial surgery was for right retinal embolus. He did not have a high-grade stenosis but did have a very irregular ulcerative plaque. He has had no further visual changes since his surgery in 2009. He has limited vision in his right eye. He has had no subsequent strokes or TIA's.   The patient denies any history of unilateral facial drooping, denies a history of hemiplegia, and denies a history of receptive or expressive aphasia.    The patient denies New Medical or Surgical History.  [facility]   Pt Diabetic: no Pt smoker: former smoker, quit in the 1950's  Pt meds include: Statin : yes ASA: yes Other anticoagulants/antiplatelets: no  Past Medical History:  Diagnosis Date  . Carotid artery occlusion   . Hypertension   . Pneumonia   . Retinal embolus, right 12/30/2007   Sudden onset of blindness   . Stroke [facility])     Social History Social History  Substance Use Topics  . Smoking status: Former Smoker    Years: 22.00    Types: Cigarettes  . Smokeless tobacco: Never Used  . Alcohol use No    Family History Family History  Problem Relation Age of Onset  . Colon cancer Father     died at 26  . Cancer Father     Colon  . Heart attack Mother   . Hypertension Daughter   . Diabetes Son     Surgical History Past Surgical History:  Procedure Laterality Date  . CAROTID ENDARTERECTOMY  01/26/2008   Right   . CHOLECYSTECTOMY N/A 01/17/2014   Procedure: LAPAROSCOPIC CHOLECYSTECTOMY;  Surgeon: Jamesetta So, MD;  Location: [facility];  Service: General;  Laterality: N/A;  . COLONOSCOPY  03/15/2008   [id number] rectum/Left-sided transverse diverticula/Diminutive cecal polyp status post  cold biopsy(Tubular adenoma)  . COLONOSCOPY  09/24/2002   [id number] rectum/Left-sided diverticulum/Polyp at the splenic flexure removed   . HERNIA REPAIR    . JOINT REPLACEMENT Right 2005   knee  . PROSTATE SURGERY     30 yrs ago  . TRANSURETHRAL RESECTION OF PROSTATE      No Known Allergies  Current Outpatient Prescriptions  Medication Sig Dispense Refill  . amLODipine (NORVASC) 5 MG tablet Take 5 mg by mouth daily.    Marland Kitchen aspirin 81 MG tablet Take 81 mg by mouth at bedtime.     . Multiple Vitamins-Minerals (PRESERVISION AREDS 2 PO) Take 1 tablet by mouth 2 (two) times daily.    Marland Kitchen omeprazole (PRILOSEC) 20 MG capsule 20 mg daily.    . simvastatin (ZOCOR) 20 MG tablet Take 20 mg by mouth at bedtime.     . valsartan-hydrochlorothiazide (DIOVAN-HCT) 320-25 MG per tablet Take 1 tablet by mouth every morning.      No current facility-administered medications for this visit.     Review of Systems : See HPI for pertinent positives and negatives.  Physical Examination  Vitals:   05/10/16 1227 05/10/16 1232 05/10/16 1237  BP: (!) 176/65 (!) 178/70 (!) 167/70  Pulse: 60  60  Resp: 16    Temp: 97 F (36.1 C)    SpO2: 98%    Weight: 187 lb (84.8 kg)    Height:  5\' 10"  (1.778 m)     Body mass index is 26.83 kg/m.  General: WDWN male in NAD GAIT: normal Eyes: PERRLA Pulmonary: Respirations are non-labored, good air movement, CTAB.  Cardiac: regular rhythm, no detected murmur.  VASCULAR EXAM Carotid Bruits Right Left   Negative Negative     orta is not palpable. Radial pulses are 2+ palpable and equal.                                                                                                                                                              LE Pulses Right Left       POPLITEAL  not palpable  not palpable    Gastrointestinal: soft, nontender, BS WNL, no r/g, no palpable masses.  Musculoskeletal: No muscle atrophy/wasting. M/S 5/5 throughout,  Extremities without ischemic changes, no lower extremity edema.  Neurologic: A&O X 3; Appropriate Affect;  Speech is normal CN 2-12 intact, Pain and light touch intact in extremities, Motor exam as listed above.    Assessment: Troy Thompson is a 82 y.o. male who is s/p right carotid endarterectomy in 2009 after a right eye embolic stroke, no subsequent strokes or TIA's.  DATA: Today's carotid Duplex suggests a widely patent right carotid endarterectomy without evidence of restenosis,  and minimal left ICA stenosis. No significant change compared to prior exam.  He takes a statin and ASA on a regular basis, he does not have DM, and he quit smoking in the 1950's.  Pt states his blood pressure increases in a medical office, he will check his blood pressure when he gets home, and will notify his PCP if his blood pressure remains greater than 140/90.    Plan: Follow-up in 1 year with Carotid Duplex scan.  I discussed in depth with the patient the nature of atherosclerosis, and emphasized the importance of maximal medical management including strict control of blood pressure, blood glucose, and lipid levels, obtaining regular exercise, and continued cessation of smoking.  The patient is aware that without maximal medical management the underlying atherosclerotic disease process will progress, limiting the benefit of any interventions. The patient was given information about stroke prevention and what symptoms should prompt the patient to seek immediate medical care. Thank you for allowing [place] to participate in this patient's care.  Clemon Chambers, RN, MSN, FNP-C [facility] [place] Office: [phone]  Clinic Physician: Donzetta Matters  05/10/16 1:03 PM

## 2016-05-13 DIAGNOSIS — M7122 Synovial cyst of popliteal space [Baker], left knee: Secondary | ICD-10-CM | POA: Diagnosis not present

## 2016-05-17 DIAGNOSIS — Z9849 Cataract extraction status, unspecified eye: Secondary | ICD-10-CM | POA: Diagnosis not present

## 2016-05-17 DIAGNOSIS — H47011 Ischemic optic neuropathy, right eye: Secondary | ICD-10-CM | POA: Diagnosis not present

## 2016-05-17 DIAGNOSIS — Z961 Presence of intraocular lens: Secondary | ICD-10-CM | POA: Diagnosis not present

## 2016-05-17 DIAGNOSIS — H47291 Other optic atrophy, right eye: Secondary | ICD-10-CM | POA: Diagnosis not present

## 2016-06-13 DIAGNOSIS — M1712 Unilateral primary osteoarthritis, left knee: Secondary | ICD-10-CM | POA: Diagnosis not present

## 2016-07-04 DIAGNOSIS — Z23 Encounter for immunization: Secondary | ICD-10-CM | POA: Diagnosis not present

## 2016-07-08 DIAGNOSIS — G464 Cerebellar stroke syndrome: Secondary | ICD-10-CM | POA: Diagnosis not present

## 2016-07-08 DIAGNOSIS — I2510 Atherosclerotic heart disease of native coronary artery without angina pectoris: Secondary | ICD-10-CM | POA: Diagnosis not present

## 2016-07-08 DIAGNOSIS — K219 Gastro-esophageal reflux disease without esophagitis: Secondary | ICD-10-CM | POA: Diagnosis not present

## 2016-07-08 DIAGNOSIS — Z79899 Other long term (current) drug therapy: Secondary | ICD-10-CM | POA: Diagnosis not present

## 2016-07-08 DIAGNOSIS — I10 Essential (primary) hypertension: Secondary | ICD-10-CM | POA: Diagnosis not present

## 2016-07-16 DIAGNOSIS — Z0001 Encounter for general adult medical examination with abnormal findings: Secondary | ICD-10-CM | POA: Diagnosis not present

## 2016-07-16 DIAGNOSIS — E785 Hyperlipidemia, unspecified: Secondary | ICD-10-CM | POA: Diagnosis not present

## 2016-07-16 DIAGNOSIS — Z6830 Body mass index (BMI) 30.0-30.9, adult: Secondary | ICD-10-CM | POA: Diagnosis not present

## 2016-07-16 DIAGNOSIS — I10 Essential (primary) hypertension: Secondary | ICD-10-CM | POA: Diagnosis not present

## 2016-07-16 DIAGNOSIS — I700 Atherosclerosis of aorta: Secondary | ICD-10-CM | POA: Diagnosis not present

## 2016-08-20 ENCOUNTER — Ambulatory Visit (INDEPENDENT_AMBULATORY_CARE_PROVIDER_SITE_OTHER): Payer: PPO | Admitting: Ophthalmology

## 2016-08-20 DIAGNOSIS — H47211 Primary optic atrophy, right eye: Secondary | ICD-10-CM | POA: Diagnosis not present

## 2016-08-20 DIAGNOSIS — H3411 Central retinal artery occlusion, right eye: Secondary | ICD-10-CM

## 2016-08-20 DIAGNOSIS — H43813 Vitreous degeneration, bilateral: Secondary | ICD-10-CM | POA: Diagnosis not present

## 2016-08-20 DIAGNOSIS — H35033 Hypertensive retinopathy, bilateral: Secondary | ICD-10-CM

## 2016-08-20 DIAGNOSIS — I10 Essential (primary) hypertension: Secondary | ICD-10-CM | POA: Diagnosis not present

## 2016-08-20 DIAGNOSIS — H353132 Nonexudative age-related macular degeneration, bilateral, intermediate dry stage: Secondary | ICD-10-CM | POA: Diagnosis not present

## 2016-08-23 NOTE — Addendum Note (Signed)
Addended by: Lianne Cure A on: 08/23/2016 03:05 PM   Modules accepted: Orders

## 2016-08-23 NOTE — Addendum Note (Signed)
Addended by: Lianne Cure A on: 08/23/2016 03:07 PM   Modules accepted: Orders

## 2016-10-14 DIAGNOSIS — Z6829 Body mass index (BMI) 29.0-29.9, adult: Secondary | ICD-10-CM | POA: Diagnosis not present

## 2016-10-14 DIAGNOSIS — R05 Cough: Secondary | ICD-10-CM | POA: Diagnosis not present

## 2017-01-16 DIAGNOSIS — I10 Essential (primary) hypertension: Secondary | ICD-10-CM | POA: Diagnosis not present

## 2017-01-16 DIAGNOSIS — K210 Gastro-esophageal reflux disease with esophagitis: Secondary | ICD-10-CM | POA: Diagnosis not present

## 2017-04-28 ENCOUNTER — Encounter: Payer: Self-pay | Admitting: Family

## 2017-05-13 ENCOUNTER — Ambulatory Visit (INDEPENDENT_AMBULATORY_CARE_PROVIDER_SITE_OTHER): Payer: PPO | Admitting: Family

## 2017-05-13 ENCOUNTER — Encounter: Payer: Self-pay | Admitting: Family

## 2017-05-13 ENCOUNTER — Ambulatory Visit (HOSPITAL_COMMUNITY)
Admission: RE | Admit: 2017-05-13 | Discharge: 2017-05-13 | Disposition: A | Discharge status: Home / Self Care | Payer: PPO | Source: Ambulatory Visit | Attending: Family | Admitting: Family

## 2017-05-13 DIAGNOSIS — Z9889 Other specified postprocedural states: Secondary | ICD-10-CM | POA: Diagnosis not present

## 2017-05-13 DIAGNOSIS — I6523 Occlusion and stenosis of bilateral carotid arteries: Secondary | ICD-10-CM | POA: Diagnosis not present

## 2017-05-13 DIAGNOSIS — Z48812 Encounter for surgical aftercare following surgery on the circulatory system: Secondary | ICD-10-CM

## 2017-05-13 NOTE — Patient Instructions (Signed)
Stroke Prevention Some medical conditions and behaviors are associated with an increased chance of having a stroke. You may prevent a stroke by making healthy choices and managing medical conditions. How can I reduce my risk of having a stroke?  Stay physically active. Get at least 30 minutes of activity on most or all days.  Do not smoke. It may also be helpful to avoid exposure to secondhand smoke.  Limit alcohol use. Moderate alcohol use is considered to be: ? No more than 2 drinks per day for men. ? No more than 1 drink per day for nonpregnant women.  Eat healthy foods. This involves: ? Eating 5 or more servings of fruits and vegetables a day. ? Making dietary changes that address high blood pressure (hypertension), high cholesterol, diabetes, or obesity.  Manage your cholesterol levels. ? Making food choices that are high in fiber and low in saturated fat, trans fat, and cholesterol may control cholesterol levels. ? Take any prescribed medicines to control cholesterol as directed by your health care provider.  Manage your diabetes. ? Controlling your carbohydrate and sugar intake is recommended to manage diabetes. ? Take any prescribed medicines to control diabetes as directed by your health care provider.  Control your hypertension. ? Making food choices that are low in salt (sodium), saturated fat, trans fat, and cholesterol is recommended to manage hypertension. ? Ask your health care provider if you need treatment to lower your blood pressure. Take any prescribed medicines to control hypertension as directed by your health care provider. ? If you are 18-39 years of age, have your blood pressure checked every 3-5 years. If you are 40 years of age or older, have your blood pressure checked every year.  Maintain a healthy weight. ? Reducing calorie intake and making food choices that are low in sodium, saturated fat, trans fat, and cholesterol are recommended to manage  weight.  Stop drug abuse.  Avoid taking birth control pills. ? Talk to your health care provider about the risks of taking birth control pills if you are over 35 years old, smoke, get migraines, or have ever had a blood clot.  Get evaluated for sleep disorders (sleep apnea). ? Talk to your health care provider about getting a sleep evaluation if you snore a lot or have excessive sleepiness.  Take medicines only as directed by your health care provider. ? For some people, aspirin or blood thinners (anticoagulants) are helpful in reducing the risk of forming abnormal blood clots that can lead to stroke. If you have the irregular heart rhythm of atrial fibrillation, you should be on a blood thinner unless there is a good reason you cannot take them. ? Understand all your medicine instructions.  Make sure that other conditions (such as anemia or atherosclerosis) are addressed. Get help right away if:  You have sudden weakness or numbness of the face, arm, or leg, especially on one side of the body.  Your face or eyelid droops to one side.  You have sudden confusion.  You have trouble speaking (aphasia) or understanding.  You have sudden trouble seeing in one or both eyes.  You have sudden trouble walking.  You have dizziness.  You have a loss of balance or coordination.  You have a sudden, severe headache with no known cause.  You have new chest pain or an irregular heartbeat. Any of these symptoms may represent a serious problem that is an emergency. Do not wait to see if the symptoms will go away.   Get medical help at once. Call your local emergency services (911 in [place] Do not drive yourself to the hospital. This information is not intended to replace advice given to you by your health care provider. Make sure you discuss any questions you have with your health care provider. Document Released: 10/31/2004 Document Revised: 02/29/2016 Document Reviewed: 03/26/2013 Elsevier  Interactive Patient Education  2017 Elsevier Inc.     Preventing Cerebrovascular Disease Arteries are blood vessels that carry blood that contains oxygen from the heart to all parts of the body. Cerebrovascular disease affects arteries that supply the brain. Any condition that blocks or disrupts blood flow to the brain can cause cerebrovascular disease. Brain cells that lose blood supply start to die within minutes (stroke). Stroke is the main danger of cerebrovascular disease. Atherosclerosis and high blood pressure are common causes of cerebrovascular disease. Atherosclerosis is narrowing and hardening of an artery that results when fat, cholesterol, calcium, or other substances (plaque) build up inside an artery. Plaque reduces blood flow through the artery. High blood pressure increases the risk of bleeding inside the brain. Making diet and lifestyle changes to prevent atherosclerosis and high blood pressure lowers your risk of cerebrovascular disease. What nutrition changes can be made?  Eat more fruits, vegetables, and whole grains.  Reduce how much saturated fat you eat. To do this, eat less red meat and fewer full-fat dairy products.  Eat healthy proteins instead of red meat. Healthy proteins include: ? Fish. Eat fish that contains heart-healthy omega-3 fatty acids, twice a week. Examples include salmon, albacore tuna, mackerel, and herring. ? Chicken. ? Nuts. ? Low-fat or nonfat yogurt.  Avoid processed meats, like bacon and lunchmeat.  Avoid foods that contain: ? A lot of sugar, such as sweets and drinks with added sugar. ? A lot of salt (sodium). Avoid adding extra salt to your food, as told by your health care provider. ? Trans fats, such as margarine and baked goods. Trans fats may be listed as "partially hydrogenated oils" on food labels.  Check food labels to see how much sodium, sugar, and trans fats are in foods.  Use vegetable oils that contain low amounts of  saturated fat, such as olive oil or canola oil. What lifestyle changes can be made?  Drink alcohol in moderation. This means no more than 1 drink a day for nonpregnant women and 2 drinks a day for men. One drink equals 12 oz of beer, 5 oz of wine, or 1 oz of hard liquor.  If you are overweight, ask your health care provider to recommend a weight-loss plan for you. Losing 5-10 lb (2.2-4.5 kg) can reduce your risk of diabetes, atherosclerosis, and high blood pressure.  Exercise for 30?60 minutes on most days, or as much as told by your health care provider. ? Do moderate-intensity exercise, such as brisk walking, bicycling, and water aerobics. Ask your health care provider which activities are safe for you.  Do not use any products that contain nicotine or tobacco, such as cigarettes and e-cigarettes. If you need help quitting, ask your health care provider. Why are these changes important? Making these changes lowers your risk of many diseases that can cause cerebrovascular disease and stroke. Stroke is a leading cause of death and disability. Making these changes also improves your overall health and quality of life. What can I do to lower my risk? The following factors make you more likely to develop cerebrovascular disease:  Being overweight.  Smoking.  Being physically inactive.    Eating a high-fat diet.  Having certain health conditions, such as: ? Diabetes. ? High blood pressure. ? Heart disease. ? Atherosclerosis. ? High cholesterol. ? Sickle cell disease.  Talk with your health care provider about your risk for cerebrovascular disease. Work with your health care provider to control diseases that you have that may contribute to cerebrovascular disease. Your health care provider may prescribe medicines to help prevent major causes of cerebrovascular disease. Where to find more information: Learn more about preventing cerebrovascular disease from:  National Heart, Lung, and  Blood Institute: www.nhlbi.nih.gov/health/health-topics/topics/stroke  Centers for Disease Control and Prevention: cdc.gov/stroke/about.htm  Summary  Cerebrovascular disease can lead to a stroke.  Atherosclerosis and high blood pressure are major causes of cerebrovascular disease.  Making diet and lifestyle changes can reduce your risk of cerebrovascular disease.  Work with your health care provider to get your risk factors under control to reduce your risk of cerebrovascular disease. This information is not intended to replace advice given to you by your health care provider. Make sure you discuss any questions you have with your health care provider. Document Released: 10/08/2015 Document Revised: 04/12/2016 Document Reviewed: 10/08/2015 Elsevier Interactive Patient Education  2018 Elsevier Inc.  

## 2017-05-13 NOTE — Progress Notes (Signed)
Chief Complaint: Follow up Extracranial Carotid Artery Stenosis   History of Present Illness  Troy Thompson is a 83 y.o. male who is s/p right carotid endarterectomy in 2009 by Dr. Donnetta Hutching. He is here today for his yearly carotid surveillance.  His initial surgery was for right retinal embolus. He did not have a high-grade stenosis but did have a very irregular ulcerative plaque. He has had no further visual changes since his surgery in 2009. He has limited vision in his right eye. He has had no subsequent strokes or TIA's.   The patient denies any history of unilateral facial drooping, denies a history of hemiplegia, and denies a history of receptive or expressive aphasia.   [facility]   Pt Diabetic: no Pt smoker: former smoker, quit in the 1950's  Pt meds include: Statin : yes ASA: yes Other anticoagulants/antiplatelets: no   Past Medical History:  Diagnosis Date  . Carotid artery occlusion   . Hypertension   . Pneumonia   . Retinal embolus, right 12/30/2007   Sudden onset of blindness   . Stroke [facility])     Social History Social History  Substance Use Topics  . Smoking status: Former Smoker    Years: 22.00    Types: Cigarettes  . Smokeless tobacco: Never Used  . Alcohol use No    Family History Family History  Problem Relation Age of Onset  . Colon cancer Father        died at 27  . Cancer Father        Colon  . Heart attack Mother   . Hypertension Daughter   . Diabetes Son     Surgical History Past Surgical History:  Procedure Laterality Date  . CAROTID ENDARTERECTOMY  01/26/2008   Right   . CHOLECYSTECTOMY N/A 01/17/2014   Procedure: LAPAROSCOPIC CHOLECYSTECTOMY;  Surgeon: Jamesetta So, MD;  Location: [facility];  Service: General;  Laterality: N/A;  . COLONOSCOPY  03/15/2008   GBT:[id number] rectum/Left-sided transverse diverticula/Diminutive cecal polyp status post cold biopsy(Tubular adenoma)  . COLONOSCOPY  09/24/2002    WVP:[id number] rectum/Left-sided diverticulum/Polyp at the splenic flexure removed   . HERNIA REPAIR    . JOINT REPLACEMENT Right 2005   knee  . PROSTATE SURGERY     30 yrs ago  . TRANSURETHRAL RESECTION OF PROSTATE      No Known Allergies  Current Outpatient Prescriptions  Medication Sig Dispense Refill  . amLODipine (NORVASC) 5 MG tablet Take 5 mg by mouth daily.    Marland Kitchen aspirin 81 MG tablet Take 81 mg by mouth at bedtime.     . Multiple Vitamins-Minerals (PRESERVISION AREDS 2 PO) Take 1 tablet by mouth 2 (two) times daily.    Marland Kitchen omeprazole (PRILOSEC) 20 MG capsule 20 mg daily.    . simvastatin (ZOCOR) 20 MG tablet Take 20 mg by mouth at bedtime.     . valsartan-hydrochlorothiazide (DIOVAN-HCT) 320-25 MG per tablet Take 1 tablet by mouth every morning.      No current facility-administered medications for this visit.     Review of Systems : See HPI for pertinent positives and negatives.  Physical Examination  Vitals:   05/13/17 1153 05/13/17 1155  BP: (!) 183/71 (!) 179/71  Pulse: (!) 53   Resp: 16   Temp: (!) 97 F (36.1 C)   TempSrc: Oral   SpO2: 99%   Weight: 189 lb (85.7 kg)   Height: 5\' 10"  (1.778 m)  Body mass index is 27.12 kg/m.  General: WDWN male in NAD GAIT:normal Eyes: PERRLA Pulmonary: Respirations are non-labored, good air movement, CTAB.  Cardiac: regular rhythm, bradycardic (not on a beta blocker), no detected murmur.  VASCULAR EXAM Carotid Bruits Right Left   Negative Negative   Abdominal aortic pulse is not palpable. Radial pulses are 2+ palpable and equal.   LE Pulses Right Left  POPLITEAL not palpable not palpable  PT palpable palpable  DP palpable palpable    Gastrointestinal: soft, nontender, BS WNL, no r/g, no palpable masses.  Musculoskeletal: No muscle atrophy/wasting. M/S 5/5 throughout, Extremities without ischemic changes, no lower extremity edema.  Neurologic: A&O X 3; appropriate  affect; speech is normal, CN 2-12 intact, Pain and light touch intact in extremities, Motor exam as listed above.     Assessment: CARTRELL BENTSEN is a 83 y.o. male who is s/p right carotid endarterectomy in 2009 after a right eye embolic stroke, no subsequent strokes or TIA's.  He takes a statin and ASA on a regular basis, he does not have DM, and he quit smoking in the 1950's.  Pt states his blood pressure increases in a medical office. He states his blood pressure in Dr. Leverne Humbles office is good.    DATA: Carotid Duplex (05/13/17):  Patent right carotid endarterectomy without evidence of restenosis. 1-39% (high end of range) left ICA stenosis. >50% left ECA stenosis.  Right vertebral artery is dampened antegrade flow, left is antegrade.  Bilateral subclavian artery waveforms are normal.  No significant change compared to prior exam on 05-10-16.   Plan: Follow-up in 1 year with Carotid Duplex scan.    I discussed in depth with the patient the nature of atherosclerosis, and emphasized the importance of maximal medical management including strict control of blood pressure, blood glucose, and lipid levels, obtaining regular exercise, and continued cessation of smoking.  The patient is aware that without maximal medical management the underlying atherosclerotic disease process will progress, limiting the benefit of any interventions. The patient was given information about stroke prevention and what symptoms should prompt the patient to seek immediate medical care. Thank you for allowing [place] to participate in this patient's care.  Clemon Chambers, RN, MSN, FNP-C [facility] [place] Office: [phone]  Clinic Physician: Early  05/13/17 11:59 AM

## 2017-05-28 NOTE — Addendum Note (Signed)
Addended by: Lianne Cure A on: 05/28/2017 03:26 PM   Modules accepted: Orders

## 2017-07-24 DIAGNOSIS — M1990 Unspecified osteoarthritis, unspecified site: Secondary | ICD-10-CM | POA: Diagnosis not present

## 2017-07-24 DIAGNOSIS — Z79899 Other long term (current) drug therapy: Secondary | ICD-10-CM | POA: Diagnosis not present

## 2017-07-24 DIAGNOSIS — I10 Essential (primary) hypertension: Secondary | ICD-10-CM | POA: Diagnosis not present

## 2017-07-24 DIAGNOSIS — E785 Hyperlipidemia, unspecified: Secondary | ICD-10-CM | POA: Diagnosis not present

## 2017-07-24 DIAGNOSIS — K219 Gastro-esophageal reflux disease without esophagitis: Secondary | ICD-10-CM | POA: Diagnosis not present

## 2017-07-24 DIAGNOSIS — G464 Cerebellar stroke syndrome: Secondary | ICD-10-CM | POA: Diagnosis not present

## 2017-07-24 DIAGNOSIS — N183 Chronic kidney disease, stage 3 (moderate): Secondary | ICD-10-CM | POA: Diagnosis not present

## 2017-07-31 DIAGNOSIS — Z0001 Encounter for general adult medical examination with abnormal findings: Secondary | ICD-10-CM | POA: Diagnosis not present

## 2017-07-31 DIAGNOSIS — N402 Nodular prostate without lower urinary tract symptoms: Secondary | ICD-10-CM | POA: Diagnosis not present

## 2017-07-31 DIAGNOSIS — Z6832 Body mass index (BMI) 32.0-32.9, adult: Secondary | ICD-10-CM | POA: Diagnosis not present

## 2017-07-31 DIAGNOSIS — I700 Atherosclerosis of aorta: Secondary | ICD-10-CM | POA: Diagnosis not present

## 2017-08-14 DIAGNOSIS — D0359 Melanoma in situ of other part of trunk: Secondary | ICD-10-CM | POA: Diagnosis not present

## 2017-08-14 DIAGNOSIS — Z1283 Encounter for screening for malignant neoplasm of skin: Secondary | ICD-10-CM | POA: Diagnosis not present

## 2017-08-14 DIAGNOSIS — D225 Melanocytic nevi of trunk: Secondary | ICD-10-CM | POA: Diagnosis not present

## 2017-08-14 DIAGNOSIS — L570 Actinic keratosis: Secondary | ICD-10-CM | POA: Diagnosis not present

## 2017-08-14 DIAGNOSIS — X32XXXA Exposure to sunlight, initial encounter: Secondary | ICD-10-CM | POA: Diagnosis not present

## 2017-08-20 ENCOUNTER — Encounter (INDEPENDENT_AMBULATORY_CARE_PROVIDER_SITE_OTHER): Payer: PPO | Admitting: Ophthalmology

## 2017-08-20 DIAGNOSIS — H35033 Hypertensive retinopathy, bilateral: Secondary | ICD-10-CM

## 2017-08-20 DIAGNOSIS — H43813 Vitreous degeneration, bilateral: Secondary | ICD-10-CM

## 2017-08-20 DIAGNOSIS — I10 Essential (primary) hypertension: Secondary | ICD-10-CM

## 2017-08-20 DIAGNOSIS — H3411 Central retinal artery occlusion, right eye: Secondary | ICD-10-CM

## 2017-08-20 DIAGNOSIS — H353131 Nonexudative age-related macular degeneration, bilateral, early dry stage: Secondary | ICD-10-CM | POA: Diagnosis not present

## 2017-08-21 DIAGNOSIS — D487 Neoplasm of uncertain behavior of other specified sites: Secondary | ICD-10-CM | POA: Diagnosis not present

## 2017-08-21 DIAGNOSIS — D0359 Melanoma in situ of other part of trunk: Secondary | ICD-10-CM | POA: Diagnosis not present

## 2017-09-02 ENCOUNTER — Ambulatory Visit (INDEPENDENT_AMBULATORY_CARE_PROVIDER_SITE_OTHER): Payer: Self-pay | Admitting: Ophthalmology

## 2017-09-03 DIAGNOSIS — K219 Gastro-esophageal reflux disease without esophagitis: Secondary | ICD-10-CM | POA: Diagnosis not present

## 2017-09-03 DIAGNOSIS — I10 Essential (primary) hypertension: Secondary | ICD-10-CM | POA: Diagnosis not present

## 2017-09-03 DIAGNOSIS — C439 Malignant melanoma of skin, unspecified: Secondary | ICD-10-CM | POA: Diagnosis not present

## 2017-10-02 DIAGNOSIS — L0390 Cellulitis, unspecified: Secondary | ICD-10-CM | POA: Diagnosis not present

## 2017-10-03 DIAGNOSIS — L03114 Cellulitis of left upper limb: Secondary | ICD-10-CM | POA: Diagnosis not present

## 2017-10-15 ENCOUNTER — Ambulatory Visit: Payer: PPO | Admitting: Urology

## 2017-10-15 DIAGNOSIS — N402 Nodular prostate without lower urinary tract symptoms: Secondary | ICD-10-CM

## 2017-11-26 DIAGNOSIS — D225 Melanocytic nevi of trunk: Secondary | ICD-10-CM | POA: Diagnosis not present

## 2017-11-26 DIAGNOSIS — Z85820 Personal history of malignant melanoma of skin: Secondary | ICD-10-CM | POA: Diagnosis not present

## 2017-11-26 DIAGNOSIS — D0359 Melanoma in situ of other part of trunk: Secondary | ICD-10-CM | POA: Diagnosis not present

## 2017-11-26 DIAGNOSIS — Z1283 Encounter for screening for malignant neoplasm of skin: Secondary | ICD-10-CM | POA: Diagnosis not present

## 2017-11-26 DIAGNOSIS — Z08 Encounter for follow-up examination after completed treatment for malignant neoplasm: Secondary | ICD-10-CM | POA: Diagnosis not present

## 2017-12-04 DIAGNOSIS — L988 Other specified disorders of the skin and subcutaneous tissue: Secondary | ICD-10-CM | POA: Diagnosis not present

## 2017-12-04 DIAGNOSIS — D0359 Melanoma in situ of other part of trunk: Secondary | ICD-10-CM | POA: Diagnosis not present

## 2017-12-10 DIAGNOSIS — Z6829 Body mass index (BMI) 29.0-29.9, adult: Secondary | ICD-10-CM | POA: Diagnosis not present

## 2017-12-10 DIAGNOSIS — I10 Essential (primary) hypertension: Secondary | ICD-10-CM | POA: Diagnosis not present

## 2017-12-10 DIAGNOSIS — I2510 Atherosclerotic heart disease of native coronary artery without angina pectoris: Secondary | ICD-10-CM | POA: Diagnosis not present

## 2018-04-07 DIAGNOSIS — N402 Nodular prostate without lower urinary tract symptoms: Secondary | ICD-10-CM | POA: Diagnosis not present

## 2018-04-14 DIAGNOSIS — N402 Nodular prostate without lower urinary tract symptoms: Secondary | ICD-10-CM | POA: Diagnosis not present

## 2018-04-14 DIAGNOSIS — R0989 Other specified symptoms and signs involving the circulatory and respiratory systems: Secondary | ICD-10-CM | POA: Diagnosis not present

## 2018-04-14 DIAGNOSIS — I10 Essential (primary) hypertension: Secondary | ICD-10-CM | POA: Diagnosis not present

## 2018-04-15 ENCOUNTER — Ambulatory Visit: Payer: PPO | Admitting: Urology

## 2018-04-15 DIAGNOSIS — N402 Nodular prostate without lower urinary tract symptoms: Secondary | ICD-10-CM

## 2018-04-20 DIAGNOSIS — X32XXXD Exposure to sunlight, subsequent encounter: Secondary | ICD-10-CM | POA: Diagnosis not present

## 2018-04-20 DIAGNOSIS — D485 Neoplasm of uncertain behavior of skin: Secondary | ICD-10-CM | POA: Diagnosis not present

## 2018-04-20 DIAGNOSIS — Z1283 Encounter for screening for malignant neoplasm of skin: Secondary | ICD-10-CM | POA: Diagnosis not present

## 2018-04-20 DIAGNOSIS — Z85820 Personal history of malignant melanoma of skin: Secondary | ICD-10-CM | POA: Diagnosis not present

## 2018-04-20 DIAGNOSIS — D225 Melanocytic nevi of trunk: Secondary | ICD-10-CM | POA: Diagnosis not present

## 2018-04-20 DIAGNOSIS — L570 Actinic keratosis: Secondary | ICD-10-CM | POA: Diagnosis not present

## 2018-04-20 DIAGNOSIS — Z08 Encounter for follow-up examination after completed treatment for malignant neoplasm: Secondary | ICD-10-CM | POA: Diagnosis not present

## 2018-04-20 DIAGNOSIS — L818 Other specified disorders of pigmentation: Secondary | ICD-10-CM | POA: Diagnosis not present

## 2018-05-14 ENCOUNTER — Other Ambulatory Visit: Payer: Self-pay

## 2018-05-14 ENCOUNTER — Ambulatory Visit (HOSPITAL_COMMUNITY)
Admission: RE | Admit: 2018-05-14 | Discharge: 2018-05-14 | Disposition: A | Discharge status: Home / Self Care | Payer: PPO | Source: Ambulatory Visit | Attending: Vascular Surgery | Admitting: Vascular Surgery

## 2018-05-14 ENCOUNTER — Ambulatory Visit: Payer: PPO | Admitting: Family

## 2018-05-14 ENCOUNTER — Encounter: Payer: Self-pay | Admitting: Family

## 2018-05-14 VITALS — BP 192/66 | HR 65 | Resp 20 | Ht 70.0 in | Wt 183.4 lb

## 2018-05-14 DIAGNOSIS — Z9889 Other specified postprocedural states: Secondary | ICD-10-CM

## 2018-05-14 DIAGNOSIS — I10 Essential (primary) hypertension: Secondary | ICD-10-CM | POA: Diagnosis not present

## 2018-05-14 DIAGNOSIS — I6523 Occlusion and stenosis of bilateral carotid arteries: Secondary | ICD-10-CM

## 2018-05-14 DIAGNOSIS — I6522 Occlusion and stenosis of left carotid artery: Secondary | ICD-10-CM | POA: Insufficient documentation

## 2018-05-14 DIAGNOSIS — Z87891 Personal history of nicotine dependence: Secondary | ICD-10-CM | POA: Diagnosis not present

## 2018-05-14 NOTE — Progress Notes (Signed)
Chief Complaint: Follow up Extracranial Carotid Artery Stenosis   History of Present Illness  Troy Thompson is a 84 y.o. male  who is s/p right carotid endarterectomy in 2009 by Dr. Donnetta Hutching. He is here today for his yearly carotid surveillance.  His initial surgery was for right retinal embolus. He did not have a high-grade stenosis but did have a very irregular ulcerative plaque. Hehas had no further visual changes since his surgery in 2009. He has limited vision in his right eye. He has had no subsequent strokes or TIA's.   His daughter died yesterday in ICU at age 14, had a stroke 5 months prior, had many seizures since her stroke. He denies headache, denies chest pain or dyspnea.  He feels his blood pressure is elevated due to this and he also states that his blood pressure increases in a medical setting.  He states his blood pressure at home runs 160'F systolic.   The patient denies any history of unilateral facial drooping, denies a history of hemiplegia, and denies a history of receptive or expressive aphasia.   He denies claudication type symptoms in his legs with walking.   [facility]   Diabetic: no Tobacco use: former smoker, quit in the 1950's  Pt meds include: Statin : yes ASA: yes Other anticoagulants/antiplatelets: no   Past Medical History:  Diagnosis Date  . Carotid artery occlusion   . Hypertension   . Pneumonia   . Retinal embolus, right 12/30/2007   Sudden onset of blindness   . Stroke [facility])     Social History Social History   Tobacco Use  . Smoking status: Former Smoker    Years: 22.00    Types: Cigarettes  . Smokeless tobacco: Never Used  Substance Use Topics  . Alcohol use: No  . Drug use: No    Family History Family History  Problem Relation Age of Onset  . Colon cancer Father        died at 56  . Cancer Father        Colon  . Heart attack Mother   . Hypertension Daughter   . Diabetes Son      Surgical History Past Surgical History:  Procedure Laterality Date  . CAROTID ENDARTERECTOMY  01/26/2008   Right   . CHOLECYSTECTOMY N/A 01/17/2014   Procedure: LAPAROSCOPIC CHOLECYSTECTOMY;  Surgeon: Jamesetta So, MD;  Location: [facility];  Service: General;  Laterality: N/A;  . COLONOSCOPY  03/15/2008   UXN:ATFTDD rectum/Left-sided transverse diverticula/Diminutive cecal polyp status post cold biopsy(Tubular adenoma)  . COLONOSCOPY  09/24/2002   UKG:URKYHC rectum/Left-sided diverticulum/Polyp at the splenic flexure removed   . HERNIA REPAIR    . JOINT REPLACEMENT Right 2005   knee  . PROSTATE SURGERY     30 yrs ago  . TRANSURETHRAL RESECTION OF PROSTATE      No Known Allergies  Current Outpatient Medications  Medication Sig Dispense Refill  . amLODipine (NORVASC) 5 MG tablet Take 5 mg by mouth daily.    Marland Kitchen aspirin 81 MG tablet Take 81 mg by mouth at bedtime.     . meclizine (ANTIVERT) 25 MG tablet Take 25 mg by mouth 3 (three) times daily as needed.  2  . Multiple Vitamins-Minerals (PRESERVISION AREDS 2 PO) Take 1 tablet by mouth 2 (two) times daily.    Marland Kitchen omeprazole (PRILOSEC) 20 MG capsule 20 mg daily.    . simvastatin (ZOCOR) 20 MG tablet Take 20 mg by  mouth at bedtime.     . valsartan-hydrochlorothiazide (DIOVAN-HCT) 320-25 MG per tablet Take 1 tablet by mouth every morning.      No current facility-administered medications for this visit.     Review of Systems : See HPI for pertinent positives and negatives.  Physical Examination  Vitals:   05/14/18 1203 05/14/18 1204  BP: (!) 200/76 (!) 192/66  Pulse: 65   Resp: 20   SpO2: 96%   Weight: 183 lb 6.4 oz (83.2 kg)   Height: 5\' 10"  (1.778 m)    Body mass index is 26.32 kg/m.  General: WDWN male in NAD GAIT:normal HENT: no gross abnormalities  Eyes: PERRLA Pulmonary: Respirations are non-labored, good air movement, CTAB. Cardiac: regular rhythm, and rate, no detected murmur.  VASCULAR EXAM Carotid Bruits  Right Left   positive Negative   Abdominal aortic pulse is not palpable. Radial pulses are 2+ palpable and equal.   LE Pulses Right Left  POPLITEAL not palpable not palpable  PT Faintly palpable Faintly palpable  DP Fainty palpable Faintly palpable    Gastrointestinal: soft, nontender, BS WNL, no r/g, no palpable masses. Musculoskeletal: Nomuscle atrophy/wasting. M/S 5/5 throughout, Extremities without ischemic changes, no lower extremity edema. Neurologic: A&O X 3; appropriate affect; speech is normal, CN 2-12 intact, Pain and light touch intact in extremities, Motor exam as listed above Skin: No rashes, no ulcers, no cellulitis.   Psychiatric: Normal thought content, mood appropriate to clinical situation.    Assessment: Troy Thompson is a 84 y.o. male who is s/p right carotid endarterectomy in 2009 after a right eye embolic stroke, no subsequent strokes or TIA's.  He takes a statin and ASA on a regular basis, he does not have DM, and he quit smoking in the 1950's.  Pt states his blood pressure increases in a medical office. He states his blood pressure in Dr. Leverne Humbles office is good.    DATA: Carotid Duplex (05-14-18):  Patent right carotid endarterectomy without evidence of restenosis. 1-39% left ICA stenosis. >50% left ECA stenosis.  Right vertebral artery is atypical antegrade flow, left is antegrade.  Bilateral subclavian artery waveforms are normal.  No significant change compared to prior exams on 05-10-16, and 05-13-17.   Plan: Follow-up in 1yearwith Carotid Duplex scan.    I discussed in depth with the patient the nature of atherosclerosis, and emphasized the importance of maximal medical management including strict control of blood pressure, blood glucose, and lipid levels, obtaining regular exercise, and continued cessation of smoking.  The patient is aware that without maximal medical management the underlying  atherosclerotic disease process will progress, limiting the benefit of any interventions. The patient was given information about stroke prevention and what symptoms should prompt the patient to seek immediate medical care. Thank you for allowing [place] to participate in this patient's care.  Clemon Chambers, RN, MSN, FNP-C [facility] [place] Office: [phone]  Clinic Physician: Oneida Alar  05/14/18 12:07 PM

## 2018-05-14 NOTE — Patient Instructions (Signed)

## 2018-07-06 DIAGNOSIS — M542 Cervicalgia: Secondary | ICD-10-CM | POA: Diagnosis not present

## 2018-07-06 DIAGNOSIS — Z23 Encounter for immunization: Secondary | ICD-10-CM | POA: Diagnosis not present

## 2018-07-27 DIAGNOSIS — M7122 Synovial cyst of popliteal space [Baker], left knee: Secondary | ICD-10-CM | POA: Diagnosis not present

## 2018-07-31 DIAGNOSIS — K219 Gastro-esophageal reflux disease without esophagitis: Secondary | ICD-10-CM | POA: Diagnosis not present

## 2018-07-31 DIAGNOSIS — Z79899 Other long term (current) drug therapy: Secondary | ICD-10-CM | POA: Diagnosis not present

## 2018-07-31 DIAGNOSIS — I10 Essential (primary) hypertension: Secondary | ICD-10-CM | POA: Diagnosis not present

## 2018-07-31 DIAGNOSIS — I2510 Atherosclerotic heart disease of native coronary artery without angina pectoris: Secondary | ICD-10-CM | POA: Diagnosis not present

## 2018-08-07 DIAGNOSIS — I129 Hypertensive chronic kidney disease with stage 1 through stage 4 chronic kidney disease, or unspecified chronic kidney disease: Secondary | ICD-10-CM | POA: Diagnosis not present

## 2018-08-07 DIAGNOSIS — Z0001 Encounter for general adult medical examination with abnormal findings: Secondary | ICD-10-CM | POA: Diagnosis not present

## 2018-08-07 DIAGNOSIS — I493 Ventricular premature depolarization: Secondary | ICD-10-CM | POA: Diagnosis not present

## 2018-08-07 DIAGNOSIS — Z6828 Body mass index (BMI) 28.0-28.9, adult: Secondary | ICD-10-CM | POA: Diagnosis not present

## 2018-08-07 DIAGNOSIS — I700 Atherosclerosis of aorta: Secondary | ICD-10-CM | POA: Diagnosis not present

## 2018-08-07 DIAGNOSIS — N183 Chronic kidney disease, stage 3 (moderate): Secondary | ICD-10-CM | POA: Diagnosis not present

## 2018-08-10 DIAGNOSIS — Z1283 Encounter for screening for malignant neoplasm of skin: Secondary | ICD-10-CM | POA: Diagnosis not present

## 2018-08-10 DIAGNOSIS — C4339 Malignant melanoma of other parts of face: Secondary | ICD-10-CM | POA: Diagnosis not present

## 2018-08-10 DIAGNOSIS — D225 Melanocytic nevi of trunk: Secondary | ICD-10-CM | POA: Diagnosis not present

## 2018-08-10 DIAGNOSIS — D485 Neoplasm of uncertain behavior of skin: Secondary | ICD-10-CM | POA: Diagnosis not present

## 2018-08-10 DIAGNOSIS — Z85820 Personal history of malignant melanoma of skin: Secondary | ICD-10-CM | POA: Diagnosis not present

## 2018-08-10 DIAGNOSIS — L905 Scar conditions and fibrosis of skin: Secondary | ICD-10-CM | POA: Diagnosis not present

## 2018-08-10 DIAGNOSIS — Z08 Encounter for follow-up examination after completed treatment for malignant neoplasm: Secondary | ICD-10-CM | POA: Diagnosis not present

## 2018-08-10 DIAGNOSIS — L308 Other specified dermatitis: Secondary | ICD-10-CM | POA: Diagnosis not present

## 2018-08-20 ENCOUNTER — Encounter (INDEPENDENT_AMBULATORY_CARE_PROVIDER_SITE_OTHER): Payer: PPO | Admitting: Ophthalmology

## 2018-08-20 DIAGNOSIS — H43813 Vitreous degeneration, bilateral: Secondary | ICD-10-CM

## 2018-08-20 DIAGNOSIS — I10 Essential (primary) hypertension: Secondary | ICD-10-CM

## 2018-08-20 DIAGNOSIS — H353111 Nonexudative age-related macular degeneration, right eye, early dry stage: Secondary | ICD-10-CM

## 2018-08-20 DIAGNOSIS — H35033 Hypertensive retinopathy, bilateral: Secondary | ICD-10-CM

## 2018-08-20 DIAGNOSIS — H353122 Nonexudative age-related macular degeneration, left eye, intermediate dry stage: Secondary | ICD-10-CM | POA: Diagnosis not present

## 2018-08-20 DIAGNOSIS — H3411 Central retinal artery occlusion, right eye: Secondary | ICD-10-CM | POA: Diagnosis not present

## 2018-08-26 DIAGNOSIS — D485 Neoplasm of uncertain behavior of skin: Secondary | ICD-10-CM | POA: Diagnosis not present

## 2018-08-26 DIAGNOSIS — D0359 Melanoma in situ of other part of trunk: Secondary | ICD-10-CM | POA: Diagnosis not present

## 2018-09-01 DIAGNOSIS — C4339 Malignant melanoma of other parts of face: Secondary | ICD-10-CM | POA: Diagnosis not present

## 2018-09-01 DIAGNOSIS — L989 Disorder of the skin and subcutaneous tissue, unspecified: Secondary | ICD-10-CM | POA: Diagnosis not present

## 2018-09-01 DIAGNOSIS — L905 Scar conditions and fibrosis of skin: Secondary | ICD-10-CM | POA: Diagnosis not present

## 2018-11-26 DIAGNOSIS — Z1283 Encounter for screening for malignant neoplasm of skin: Secondary | ICD-10-CM | POA: Diagnosis not present

## 2018-11-26 DIAGNOSIS — L821 Other seborrheic keratosis: Secondary | ICD-10-CM | POA: Diagnosis not present

## 2018-11-26 DIAGNOSIS — L570 Actinic keratosis: Secondary | ICD-10-CM | POA: Diagnosis not present

## 2018-11-26 DIAGNOSIS — Z85820 Personal history of malignant melanoma of skin: Secondary | ICD-10-CM | POA: Diagnosis not present

## 2018-11-26 DIAGNOSIS — X32XXXD Exposure to sunlight, subsequent encounter: Secondary | ICD-10-CM | POA: Diagnosis not present

## 2018-11-26 DIAGNOSIS — Z08 Encounter for follow-up examination after completed treatment for malignant neoplasm: Secondary | ICD-10-CM | POA: Diagnosis not present

## 2018-12-07 DIAGNOSIS — K219 Gastro-esophageal reflux disease without esophagitis: Secondary | ICD-10-CM | POA: Diagnosis not present

## 2018-12-07 DIAGNOSIS — I10 Essential (primary) hypertension: Secondary | ICD-10-CM | POA: Diagnosis not present

## 2018-12-07 DIAGNOSIS — Z6828 Body mass index (BMI) 28.0-28.9, adult: Secondary | ICD-10-CM | POA: Diagnosis not present

## 2018-12-24 DIAGNOSIS — M7122 Synovial cyst of popliteal space [Baker], left knee: Secondary | ICD-10-CM | POA: Diagnosis not present

## 2018-12-28 DIAGNOSIS — M25562 Pain in left knee: Secondary | ICD-10-CM | POA: Diagnosis not present

## 2019-01-12 ENCOUNTER — Other Ambulatory Visit (HOSPITAL_COMMUNITY)
Admission: RE | Admit: 2019-01-12 | Discharge: 2019-01-12 | Disposition: A | Discharge status: Home / Self Care | Payer: PPO | Source: Other Acute Inpatient Hospital | Attending: Ophthalmology | Admitting: Ophthalmology

## 2019-01-12 DIAGNOSIS — I5022 Chronic systolic (congestive) heart failure: Secondary | ICD-10-CM | POA: Diagnosis not present

## 2019-01-12 DIAGNOSIS — H353132 Nonexudative age-related macular degeneration, bilateral, intermediate dry stage: Secondary | ICD-10-CM | POA: Diagnosis not present

## 2019-01-12 DIAGNOSIS — H538 Other visual disturbances: Secondary | ICD-10-CM | POA: Diagnosis not present

## 2019-01-12 DIAGNOSIS — H35033 Hypertensive retinopathy, bilateral: Secondary | ICD-10-CM | POA: Diagnosis not present

## 2019-01-12 DIAGNOSIS — H5462 Unqualified visual loss, left eye, normal vision right eye: Secondary | ICD-10-CM | POA: Insufficient documentation

## 2019-01-12 DIAGNOSIS — H53122 Transient visual loss, left eye: Secondary | ICD-10-CM | POA: Diagnosis not present

## 2019-01-12 DIAGNOSIS — H47291 Other optic atrophy, right eye: Secondary | ICD-10-CM | POA: Diagnosis not present

## 2019-01-12 LAB — CBC
HCT: 35.2 % — ABNORMAL LOW (ref 39.0–52.0)
Hemoglobin: 11.8 g/dL — ABNORMAL LOW (ref 13.0–17.0)
MCH: 29.4 pg (ref 26.0–34.0)
MCHC: 33.5 g/dL (ref 30.0–36.0)
MCV: 87.8 fL (ref 80.0–100.0)
Platelets: 209 10*3/uL (ref 150–400)
RBC: 4.01 MIL/uL — ABNORMAL LOW (ref 4.22–5.81)
RDW: 13.5 % (ref 11.5–15.5)
WBC: 10.1 10*3/uL (ref 4.0–10.5)
nRBC: 0 % (ref 0.0–0.2)

## 2019-01-12 LAB — SEDIMENTATION RATE: Sed Rate: 16 mm/hr (ref 0–16)

## 2019-01-12 LAB — C-REACTIVE PROTEIN: CRP: 1.9 mg/dL — ABNORMAL HIGH (ref <1.0)

## 2019-01-13 ENCOUNTER — Telehealth: Payer: Self-pay | Admitting: *Deleted

## 2019-01-13 NOTE — Telephone Encounter (Signed)
-----   Message from Angelia Mould, MD sent at 01/12/2019  4:19 PM EDT ----- Regarding: office visit This patient had a right carotid endarterectomy by Dr. early in 2009.  He was seen by the nurse practitioner in August 2019.  At that time the right carotid endarterectomy site was widely patent with no stenosis on the left.  Dr. Dorris Carnes called today and the patient had a visual disturbance and wanted some direction on what to do about this.  He was concerned that it might be amaurosis fugax although the symptoms are somewhat vague.  Regardless, I have reassured him that I can get an appointment for the patient to be seen in the next week or so.  He will need a carotid duplex and an office visit.  Ideally this would be on Dr. early schedule as he has previously done his carotid surgery.  Thank you. CD

## 2019-01-13 NOTE — Telephone Encounter (Signed)
DOCUMENTATION OF PHONE CALL FOR DR. DICKSON

## 2019-01-14 ENCOUNTER — Telehealth: Payer: Self-pay | Admitting: Vascular Surgery

## 2019-01-14 NOTE — Telephone Encounter (Signed)
sch appt spk to pt mld ltr 01/26/2019 9am carotid 945am f/u MD

## 2019-01-14 NOTE — Telephone Encounter (Signed)
-----   Message from Mena Goes, RN sent at 01/13/2019 11:03 AM EDT ----- Regarding: FW: office visit asap with carotid duplex  ----- Message ----- From: Angelia Mould, MD Sent: 01/12/2019   4:19 PM EDT To: [facility] Charge Pool Subject: office visit                                   This patient had a right carotid endarterectomy by Dr. early in 2009.  He was seen by the nurse practitioner in August 2019.  At that time the right carotid endarterectomy site was widely patent with no stenosis on the left.  Dr. Dorris Carnes called today and the patient had a visual disturbance and wanted some direction on what to do about this.  He was concerned that it might be amaurosis fugax although the symptoms are somewhat vague.  Regardless, I have reassured him that I can get an appointment for the patient to be seen in the next week or so.  He will need a carotid duplex and an office visit.  Ideally this would be on Dr. early schedule as he has previously done his carotid surgery.  Thank you. CD

## 2019-01-19 ENCOUNTER — Other Ambulatory Visit: Payer: Self-pay

## 2019-01-19 DIAGNOSIS — I6523 Occlusion and stenosis of bilateral carotid arteries: Secondary | ICD-10-CM

## 2019-01-26 ENCOUNTER — Encounter: Payer: Self-pay | Admitting: Vascular Surgery

## 2019-01-26 ENCOUNTER — Ambulatory Visit (INDEPENDENT_AMBULATORY_CARE_PROVIDER_SITE_OTHER): Payer: PPO | Admitting: Vascular Surgery

## 2019-01-26 ENCOUNTER — Other Ambulatory Visit: Payer: Self-pay

## 2019-01-26 ENCOUNTER — Ambulatory Visit (HOSPITAL_COMMUNITY)
Admission: RE | Admit: 2019-01-26 | Discharge: 2019-01-26 | Disposition: A | Discharge status: Home / Self Care | Payer: PPO | Source: Ambulatory Visit | Attending: Vascular Surgery | Admitting: Vascular Surgery

## 2019-01-26 VITALS — BP 193/66 | HR 65 | Resp 20 | Ht 70.0 in | Wt 179.9 lb

## 2019-01-26 DIAGNOSIS — I6523 Occlusion and stenosis of bilateral carotid arteries: Secondary | ICD-10-CM | POA: Diagnosis not present

## 2019-01-26 NOTE — Progress Notes (Signed)
[facility] [place]  Patient name: Troy Thompson MRN: [id number] DOB: [birth year] Sex: male  REASON FOR VISIT: Follow-up carotid stenosis in light of recent left eye visual changes  HPI: Troy Thompson is a 85 y.o. male here today for evaluation with his son.  He is well-known to me from prior right carotid endarterectomy in 2009.  He had a preoperative central retinal artery occlusion with sudden blindness.  He has had no new neurologic deficits.  He has been followed in our office with moderate asymptomatic left carotid stenosis.  3 weeks ago today he had an episode of blurry vision in his left eye which resolved spontaneously.  He saw Dr. Katy Fitch for eye examination.  I am obtaining his office notes from this visit.  He specifically denies any other focal deficits.  He is right-handed.  He has had no right-sided weakness or a aphasia.  He is blind in his right eye but is otherwise quite active and alert.  He drives independently.  Past Medical History:  Diagnosis Date  . Carotid artery occlusion   . Hypertension   . Pneumonia   . Retinal embolus, right 12/30/2007   Sudden onset of blindness   . Stroke [facility])     Family History  Problem Relation Age of Onset  . Colon cancer Father        died at 55  . Cancer Father        Colon  . Heart attack Mother   . Hypertension Daughter   . Diabetes Son     SOCIAL HISTORY: Social History   Tobacco Use  . Smoking status: Former Smoker    Years: 22.00    Types: Cigarettes  . Smokeless tobacco: Never Used  Substance Use Topics  . Alcohol use: No    No Known Allergies  Current Outpatient Medications  Medication Sig Dispense Refill  . amLODipine (NORVASC) 5 MG tablet Take 5 mg by mouth daily.    Marland Kitchen aspirin 81 MG tablet Take 81 mg by mouth at bedtime.     . meclizine (ANTIVERT) 25 MG tablet Take 25 mg by mouth 3 (three) times daily as needed.  2  . Multiple  Vitamins-Minerals (PRESERVISION AREDS 2 PO) Take 1 tablet by mouth 2 (two) times daily.    Marland Kitchen omeprazole (PRILOSEC) 20 MG capsule 20 mg daily.    . simvastatin (ZOCOR) 20 MG tablet Take 20 mg by mouth at bedtime.     . valsartan-hydrochlorothiazide (DIOVAN-HCT) 320-25 MG per tablet Take 1 tablet by mouth every morning.      No current facility-administered medications for this visit.     REVIEW OF SYSTEMS:  [X]  denotes positive finding, [ ]  denotes negative finding Cardiac  Comments:  Chest pain or chest pressure:    Shortness of breath upon exertion:    Short of breath when lying flat:    Irregular heart rhythm:        Vascular    Pain in calf, thigh, or hip brought on by ambulation:    Pain in feet at night that wakes you up from your sleep:     Blood clot in your veins:    Leg swelling:           PHYSICAL EXAM: Vitals:   01/26/19 0959 01/26/19 1001  BP: (!) 191/62 (!) 193/66  Pulse: 65   Resp: 20   SpO2: 99%   Weight: 179 lb 14.4 oz (81.6 kg)  Height: 5\' 10"  (1.778 m)     GENERAL: The patient is a well-nourished male, in no acute distress. The vital signs are documented above. CARDIOVASCULAR: Carotid arteries are without bruits bilaterally.  2+ radial pulses bilaterally. PULMONARY: There is good air exchange  MUSCULOSKELETAL: There are no major deformities or cyanosis. NEUROLOGIC: No focal weakness or paresthesias are detected. SKIN: There are no ulcers or rashes noted. PSYCHIATRIC: The patient has a normal affect.  DATA:  Carotid duplex today reveals extensive calcification in his left carotid artery with probably 40 to 50% stenosis.  Right carotid endarterectomy widely patent  MEDICAL ISSUES: Had long discussion with the patient and his son present.  Explained that he is certainly below the threshold for recommendation of elective endarterectomy based on his duplex criteria.  The real question as to whether or not his left eye symptoms were related to an embolic  event.  He is blind in his right eye and so therefore would be quite concerned about this.  We will obtain CT angiogram of his neck at [facility] and make further recommendations based on this.  We will also obtain the office evaluation from [place].  I will contact the patient for further recommendations following his CT    Rosetta Posner, MD [facility] Tel [phone] Pager [phone]

## 2019-01-27 ENCOUNTER — Other Ambulatory Visit: Payer: Self-pay

## 2019-01-27 ENCOUNTER — Ambulatory Visit (HOSPITAL_COMMUNITY)
Admission: RE | Admit: 2019-01-27 | Discharge: 2019-01-27 | Disposition: A | Discharge status: Home / Self Care | Payer: PPO | Source: Ambulatory Visit | Attending: Vascular Surgery | Admitting: Vascular Surgery

## 2019-01-27 DIAGNOSIS — I6523 Occlusion and stenosis of bilateral carotid arteries: Secondary | ICD-10-CM | POA: Insufficient documentation

## 2019-01-27 DIAGNOSIS — I6501 Occlusion and stenosis of right vertebral artery: Secondary | ICD-10-CM | POA: Diagnosis not present

## 2019-01-27 DIAGNOSIS — Z9862 Peripheral vascular angioplasty status: Secondary | ICD-10-CM | POA: Diagnosis not present

## 2019-01-27 DIAGNOSIS — I6522 Occlusion and stenosis of left carotid artery: Secondary | ICD-10-CM | POA: Diagnosis not present

## 2019-01-27 LAB — POCT I-STAT CREATININE: Creatinine, Ser: 1 mg/dL (ref 0.61–1.24)

## 2019-01-27 MED ORDER — IOHEXOL 350 MG/ML SOLN
100.0000 mL | Freq: Once | INTRAVENOUS | Status: AC | PRN
  Administered 2019-01-27: 100 mL via INTRAVENOUS

## 2019-02-02 ENCOUNTER — Telehealth: Payer: Self-pay | Admitting: Vascular Surgery

## 2019-02-02 NOTE — Telephone Encounter (Signed)
I called the patient by telephone to discuss the results of his CT scan from 01/25/2027 his CT angiogram revealed calcified 60% stenosis in his left internal carotid artery at its origin and a tandem lesion at about 60% 1 cm further distal.  His right carotid endarterectomy site was widely patent.  I discussed this at length with the patient.  I also received information from [facility].  There was no evidence of Hollenhorst plaque or anything definitively pointing towards embolus to his left eye as the cause of his single episode of blurry vision on the left.  I explained I could make an argument for proceeding with endarterectomy but also could make an argument for observation only.  His plaque does appear to be relatively smooth and is only moderate in severity.  He did not have classic amaurosis and no evidence of an hoarse plaque on physical exam with Dr. Katy Fitch. I have suggested that we see him back in the office in 2 months following the  pandemic.  We will review his imaging and discuss further.  He knows to contact us immediately if he has recurrent visual changes or speech disturbance or right sided symptoms.  We will coordinate office visit at that time

## 2019-02-22 DIAGNOSIS — H3411 Central retinal artery occlusion, right eye: Secondary | ICD-10-CM | POA: Diagnosis not present

## 2019-02-22 DIAGNOSIS — H53122 Transient visual loss, left eye: Secondary | ICD-10-CM | POA: Diagnosis not present

## 2019-02-22 DIAGNOSIS — H47291 Other optic atrophy, right eye: Secondary | ICD-10-CM | POA: Diagnosis not present

## 2019-02-22 DIAGNOSIS — H538 Other visual disturbances: Secondary | ICD-10-CM | POA: Diagnosis not present

## 2019-02-24 ENCOUNTER — Telehealth: Payer: Self-pay | Admitting: Vascular Surgery

## 2019-02-24 NOTE — Telephone Encounter (Signed)
sch appt spk to pt mld ltr 04/06/2019 915am f/u MD

## 2019-02-24 NOTE — Telephone Encounter (Signed)
-----   Message from Rosetta Posner, MD sent at 02/02/2019  1:45 PM EDT -----  I need to see Troy Thompson in the office in 2 months.  He does not need any noninvasive studies.  Please call to schedule an appointment

## 2019-04-05 ENCOUNTER — Telehealth (HOSPITAL_COMMUNITY): Payer: Self-pay | Admitting: Rehabilitation

## 2019-04-05 NOTE — Telephone Encounter (Signed)

## 2019-04-06 ENCOUNTER — Encounter: Payer: Self-pay | Admitting: Vascular Surgery

## 2019-04-06 ENCOUNTER — Other Ambulatory Visit: Payer: Self-pay

## 2019-04-06 ENCOUNTER — Ambulatory Visit (INDEPENDENT_AMBULATORY_CARE_PROVIDER_SITE_OTHER): Payer: PPO | Admitting: Vascular Surgery

## 2019-04-06 VITALS — BP 190/68 | HR 70 | Temp 97.7°F | Resp 20 | Ht 70.0 in | Wt 179.1 lb

## 2019-04-06 DIAGNOSIS — I6523 Occlusion and stenosis of bilateral carotid arteries: Secondary | ICD-10-CM

## 2019-04-06 DIAGNOSIS — Z9889 Other specified postprocedural states: Secondary | ICD-10-CM

## 2019-04-06 NOTE — Progress Notes (Signed)
[facility] [place]  Patient name: Troy Thompson MRN: [id number] DOB: Sep 03, 1925 Sex: male  REASON FOR VISIT: Continued discussion of extracranial cerebrovascular occlusive disease  HPI: Troy Thompson is a 85 y.o. male here today for continued discussion.  I have also I am discussing this on speaker phone with his son.  Has had no new visual changes and no focal neurologic deficits.  He is well-known to me from prior right endarterectomy following symptoms of central retinal artery occlusion on the right with blindness.  He had recently had vague visual changes on the left.  Duplex had shown moderate stenosis and I recommended a CT angiogram for further evaluation.  CT angiogram showed tandem lesions at his bifurcation and internal carotid which were calcified and relatively smooth and approximately 60% stenosis.  Residual lumen was approximately 2-1/2 mm.  We had recommended continued observation when I discussed this by telephone due to the [place] epidemic.  He is here today for continued discussion.  Past Medical History:  Diagnosis Date  . Carotid artery occlusion   . Hypertension   . Pneumonia   . Retinal embolus, right 12/30/2007   Sudden onset of blindness   . Stroke [facility])     Family History  Problem Relation Age of Onset  . Colon cancer Father        died at 58  . Cancer Father        Colon  . Heart attack Mother   . Hypertension Daughter   . Diabetes Son     SOCIAL HISTORY: Social History   Tobacco Use  . Smoking status: Former Smoker    Years: 22.00    Types: Cigarettes  . Smokeless tobacco: Never Used  Substance Use Topics  . Alcohol use: No    No Known Allergies  Current Outpatient Medications  Medication Sig Dispense Refill  . amLODipine (NORVASC) 5 MG tablet Take 5 mg by mouth daily.    Marland Kitchen aspirin 81 MG tablet Take 81 mg by mouth at bedtime.     . hydrochlorothiazide (MICROZIDE) 12.5 MG  capsule TAKE TWO CAPSULES BY MOUTH EVERY DAY - TAKE WITH LOSARTAN    . losartan (COZAAR) 50 MG tablet TAKE TWO TABLETS BY MOUTH EVERY DAY -TAKE WITH HCTZ    . meclizine (ANTIVERT) 25 MG tablet Take 25 mg by mouth 3 (three) times daily as needed.  2  . Multiple Vitamins-Minerals (PRESERVISION AREDS 2 PO) Take 1 tablet by mouth 2 (two) times daily.    Marland Kitchen omeprazole (PRILOSEC) 20 MG capsule 20 mg daily.    . simvastatin (ZOCOR) 20 MG tablet Take 20 mg by mouth at bedtime.     . valsartan-hydrochlorothiazide (DIOVAN-HCT) 320-25 MG per tablet Take 1 tablet by mouth every morning.      No current facility-administered medications for this visit.     REVIEW OF SYSTEMS:  [X]  denotes positive finding, [ ]  denotes negative finding Cardiac  Comments:  Chest pain or chest pressure:    Shortness of breath upon exertion:    Short of breath when lying flat:    Irregular heart rhythm:        Vascular    Pain in calf, thigh, or hip brought on by ambulation:    Pain in feet at night that wakes you up from your sleep:     Blood clot in your veins:    Leg swelling:           PHYSICAL  EXAM: Vitals:   04/06/19 0904  BP: (!) 190/68  Pulse: 70  Resp: 20  Temp: 97.7 F (36.5 C)  TempSrc: Temporal  SpO2: 97%  Weight: 179 lb 1.6 oz (81.2 kg)  Height: 5\' 10"  (1.778 m)    GENERAL: The patient is a well-nourished male, in no acute distress. The vital signs are documented above. CARDIOVASCULAR: Carotid arteries are without bruits bilaterally. PULMONARY: There is good air exchange  MUSCULOSKELETAL: There are no major deformities or cyanosis. NEUROLOGIC: No focal weakness or paresthesias are detected. SKIN: There are no ulcers or rashes noted. PSYCHIATRIC: The patient has a normal affect.  DATA:  CT was again reviewed.  This showed widely patent endarterectomy on the right.  He does have approximately 60% stenosis in his internal carotid at the left bifurcation and above this.  Internal carotid above  this lesion is completely normal.  MEDICAL ISSUES: Discussed options with the patient and his son.  He has not had any focal neurologic deficits and his eye exam did not show Hollenhorst plaque or anything that would specifically suggest embolic disease.  I feel it is safest to continue observation only and would see him again in 6 months with carotid duplex to rule out progression of his asymptomatic disease.  I did explain that even at his advanced age of 89, he remains very active and if we felt he had focal deficit related to carotid we would recommend endarterectomy.  He will notify should he develop any new episodes.  Otherwise we will see him in 6 months    Rosetta Posner, MD [facility] Tel [phone] Pager [phone]

## 2019-04-08 DIAGNOSIS — I2510 Atherosclerotic heart disease of native coronary artery without angina pectoris: Secondary | ICD-10-CM | POA: Diagnosis not present

## 2019-04-08 DIAGNOSIS — I10 Essential (primary) hypertension: Secondary | ICD-10-CM | POA: Diagnosis not present

## 2019-04-08 DIAGNOSIS — K219 Gastro-esophageal reflux disease without esophagitis: Secondary | ICD-10-CM | POA: Diagnosis not present

## 2019-05-20 DIAGNOSIS — R42 Dizziness and giddiness: Secondary | ICD-10-CM | POA: Diagnosis not present

## 2019-05-20 DIAGNOSIS — M47812 Spondylosis without myelopathy or radiculopathy, cervical region: Secondary | ICD-10-CM | POA: Diagnosis not present

## 2019-05-20 DIAGNOSIS — M9901 Segmental and somatic dysfunction of cervical region: Secondary | ICD-10-CM | POA: Diagnosis not present

## 2019-05-24 DIAGNOSIS — R42 Dizziness and giddiness: Secondary | ICD-10-CM | POA: Diagnosis not present

## 2019-05-24 DIAGNOSIS — M47812 Spondylosis without myelopathy or radiculopathy, cervical region: Secondary | ICD-10-CM | POA: Diagnosis not present

## 2019-05-24 DIAGNOSIS — M9901 Segmental and somatic dysfunction of cervical region: Secondary | ICD-10-CM | POA: Diagnosis not present

## 2019-05-27 DIAGNOSIS — M47812 Spondylosis without myelopathy or radiculopathy, cervical region: Secondary | ICD-10-CM | POA: Diagnosis not present

## 2019-05-27 DIAGNOSIS — R42 Dizziness and giddiness: Secondary | ICD-10-CM | POA: Diagnosis not present

## 2019-05-27 DIAGNOSIS — M9901 Segmental and somatic dysfunction of cervical region: Secondary | ICD-10-CM | POA: Diagnosis not present

## 2019-05-28 ENCOUNTER — Encounter (INDEPENDENT_AMBULATORY_CARE_PROVIDER_SITE_OTHER): Payer: PPO | Admitting: Ophthalmology

## 2019-05-28 ENCOUNTER — Other Ambulatory Visit: Payer: Self-pay

## 2019-05-28 DIAGNOSIS — H43813 Vitreous degeneration, bilateral: Secondary | ICD-10-CM

## 2019-05-28 DIAGNOSIS — H35033 Hypertensive retinopathy, bilateral: Secondary | ICD-10-CM | POA: Diagnosis not present

## 2019-05-28 DIAGNOSIS — H353132 Nonexudative age-related macular degeneration, bilateral, intermediate dry stage: Secondary | ICD-10-CM | POA: Diagnosis not present

## 2019-05-28 DIAGNOSIS — I10 Essential (primary) hypertension: Secondary | ICD-10-CM

## 2019-05-28 DIAGNOSIS — H3411 Central retinal artery occlusion, right eye: Secondary | ICD-10-CM

## 2019-06-01 DIAGNOSIS — M47812 Spondylosis without myelopathy or radiculopathy, cervical region: Secondary | ICD-10-CM | POA: Diagnosis not present

## 2019-06-01 DIAGNOSIS — R42 Dizziness and giddiness: Secondary | ICD-10-CM | POA: Diagnosis not present

## 2019-06-01 DIAGNOSIS — M9901 Segmental and somatic dysfunction of cervical region: Secondary | ICD-10-CM | POA: Diagnosis not present

## 2019-06-08 DIAGNOSIS — M9901 Segmental and somatic dysfunction of cervical region: Secondary | ICD-10-CM | POA: Diagnosis not present

## 2019-06-08 DIAGNOSIS — M47812 Spondylosis without myelopathy or radiculopathy, cervical region: Secondary | ICD-10-CM | POA: Diagnosis not present

## 2019-06-08 DIAGNOSIS — R42 Dizziness and giddiness: Secondary | ICD-10-CM | POA: Diagnosis not present

## 2019-06-18 DIAGNOSIS — Z23 Encounter for immunization: Secondary | ICD-10-CM | POA: Diagnosis not present

## 2019-08-06 IMAGING — CT CT ANGIOGRAPHY NECK
2 of 7 series · 8 of 33 positions shown · IV contrast (Isovue)
Comparison: Carotid Doppler 01/05/2008

CLINICAL DATA: Carotid stenosis.  Right carotid endarterectomy 7229

EXAM:
CT ANGIOGRAPHY NECK
TECHNIQUE: Multidetector CT imaging of the neck was performed using the
standard protocol during bolus administration of intravenous
contrast. Multiplanar CT image reconstructions and MIPs were
obtained to evaluate the vascular anatomy. Carotid stenosis
measurements (when applicable) are obtained utilizing NASCET
criteria, using the distal internal carotid diameter as the
denominator.
CONTRAST:  100mL OMNIPAQUE IOHEXOL 350 MG/ML SOLN

[Series 7: cta neck · axial · 0.52mm/px · z∈[+1467,+1543]mm · 2 of 116 slices shown]
[im 39/116  soft-tissue]
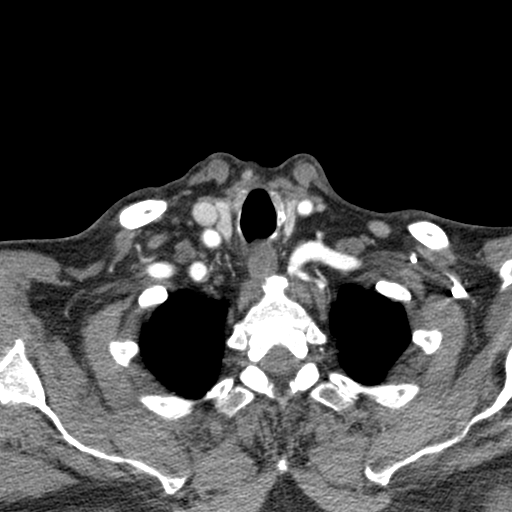
[im 77/116  soft-tissue]
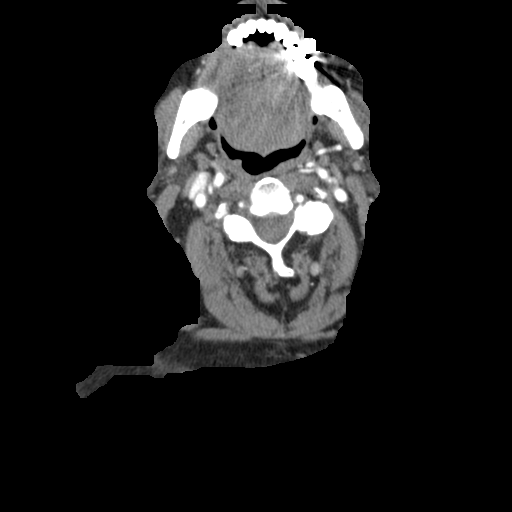

[Series 9: ax thin · axial · 0.39mm/px · z∈[+1425,+1590]mm · 6 of 231 slices shown]
[im 33/231  soft-tissue]
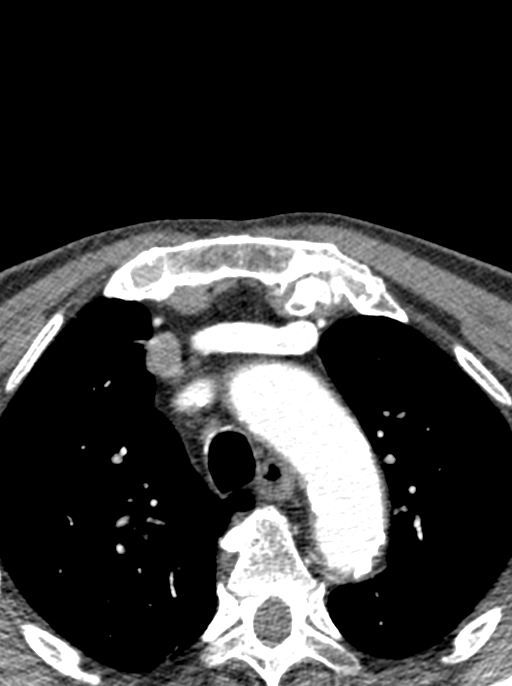
[im 66/231  bone]
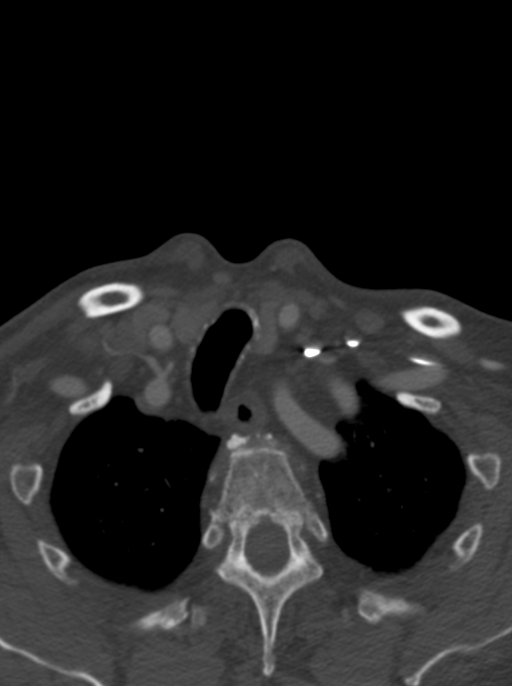
[im 99/231  soft-tissue]
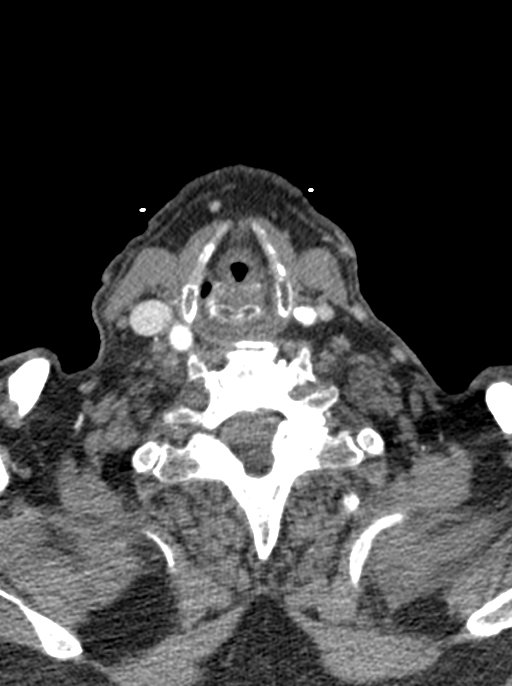
[im 132/231  bone]
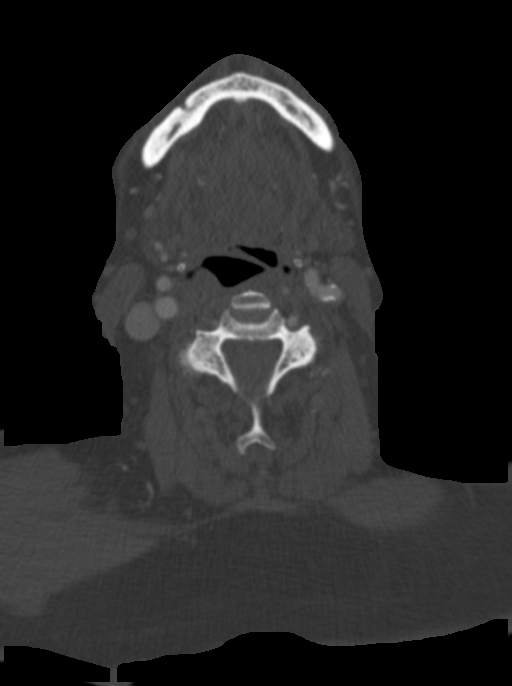
[im 165/231  soft-tissue]
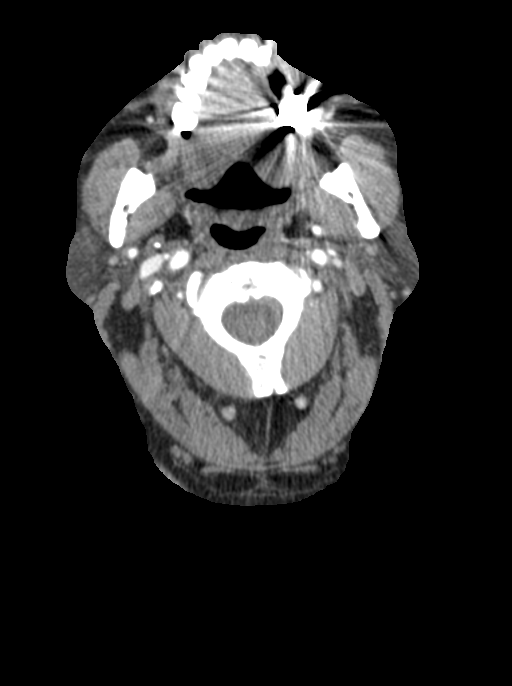
[im 198/231  bone]
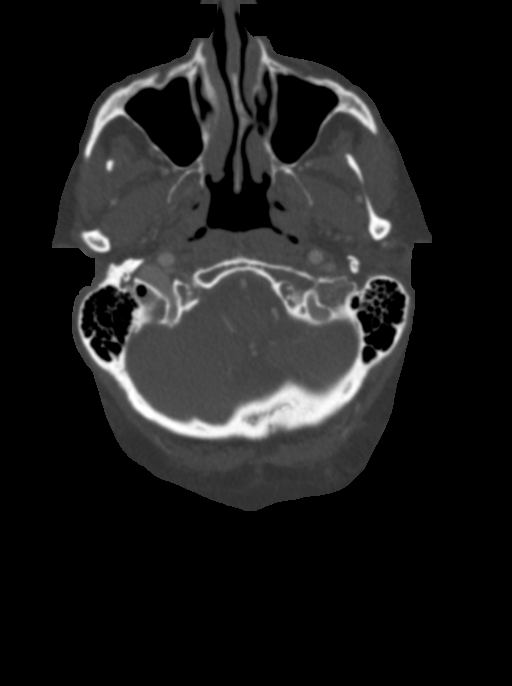

[8 of 33 positions shown; findings below may reference images not displayed]

FINDINGS: Aortic arch: Atherosclerotic aortic arch without aneurysm or
dissection. Atherosclerotic calcification proximal great vessels
diffusely without significant stenosis. Subclavian arteries widely
patent bilaterally.

Right carotid system: Right common carotid artery widely patent.
Postop right carotid endarterectomy which is widely patent without
significant stenosis or atherosclerotic disease

Left carotid system: Calcified plaque left common carotid artery
without significant stenosis. Atherosclerotic calcification left
carotid bifurcation and proximal left internal carotid artery.
Minimal luminal diameter. 2.4 mm. Approximately 60% diameter
stenosis proximal left internal carotid artery and tandem 60%
diameter stenosis approximately 1 cm distal. External carotid artery
widely patent

Vertebral arteries: Right vertebral artery occluded at the origin
with reconstitution via collaterals at approximately C5 level. This
is then patent to the basilar with scattered atherosclerotic
disease.

Left vertebral artery patent to the basilar without significant
stenosis.

Skeleton: Cervical spondylosis with disc and facet degeneration
diffusely. No acute skeletal abnormality.

Other neck: Mucosal edema paranasal sinuses. No soft tissue mass in
the neck.

Upper chest: Lung apices clear bilaterally.
IMPRESSION: 1. Postop right carotid endarterectomy which is widely patent
2. Atherosclerotic calcification left carotid bifurcation. Tandem
60% diameter stenosis in the proximal left internal carotid artery
due to calcified plaque
3. Occluded right vertebral artery at the origin with reconstitution
at C5 level. Left vertebral artery patent without significant
stenosis.

## 2019-08-10 DIAGNOSIS — M1990 Unspecified osteoarthritis, unspecified site: Secondary | ICD-10-CM | POA: Diagnosis not present

## 2019-08-10 DIAGNOSIS — I700 Atherosclerosis of aorta: Secondary | ICD-10-CM | POA: Diagnosis not present

## 2019-08-10 DIAGNOSIS — N183 Chronic kidney disease, stage 3 unspecified: Secondary | ICD-10-CM | POA: Diagnosis not present

## 2019-08-10 DIAGNOSIS — I10 Essential (primary) hypertension: Secondary | ICD-10-CM | POA: Diagnosis not present

## 2019-08-10 DIAGNOSIS — I2510 Atherosclerotic heart disease of native coronary artery without angina pectoris: Secondary | ICD-10-CM | POA: Diagnosis not present

## 2019-08-10 DIAGNOSIS — Z79899 Other long term (current) drug therapy: Secondary | ICD-10-CM | POA: Diagnosis not present

## 2019-08-17 DIAGNOSIS — K219 Gastro-esophageal reflux disease without esophagitis: Secondary | ICD-10-CM | POA: Diagnosis not present

## 2019-08-17 DIAGNOSIS — I2510 Atherosclerotic heart disease of native coronary artery without angina pectoris: Secondary | ICD-10-CM | POA: Diagnosis not present

## 2019-08-17 DIAGNOSIS — I10 Essential (primary) hypertension: Secondary | ICD-10-CM | POA: Diagnosis not present

## 2019-08-17 DIAGNOSIS — E785 Hyperlipidemia, unspecified: Secondary | ICD-10-CM | POA: Diagnosis not present

## 2019-08-26 ENCOUNTER — Encounter (INDEPENDENT_AMBULATORY_CARE_PROVIDER_SITE_OTHER): Payer: PPO | Admitting: Ophthalmology

## 2019-11-02 DIAGNOSIS — H903 Sensorineural hearing loss, bilateral: Secondary | ICD-10-CM | POA: Diagnosis not present

## 2019-11-02 DIAGNOSIS — R42 Dizziness and giddiness: Secondary | ICD-10-CM | POA: Diagnosis not present

## 2019-11-10 DIAGNOSIS — R2689 Other abnormalities of gait and mobility: Secondary | ICD-10-CM | POA: Diagnosis not present

## 2019-11-15 ENCOUNTER — Telehealth (HOSPITAL_COMMUNITY): Payer: Self-pay

## 2019-11-15 NOTE — Telephone Encounter (Signed)
The above patient or their representative was contacted and gave the following answers to these questions:         Do you have any of the following symptoms?    NO  Fever                    Cough                   Shortness of breath  Do  you have any of the following other symptoms?    muscle pain         vomiting,        diarrhea        rash         weakness        red eye        abdominal pain         bruising          bruising or bleeding              joint pain           severe headache    Have you been in contact with someone who was or has been sick in the past 2 weeks?  NO  Yes                 Unsure                         Unable to assess   Does the person that you were in contact with have any of the following symptoms?   Cough         shortness of breath           muscle pain         vomiting,            diarrhea            rash            weakness           fever            red eye           abdominal pain           bruising  or  bleeding                joint pain                severe headache                 COMMENTS OR ACTION PLAN FOR THIS PATIENT:     ALL QUESTIONS WERE ANSWERED BY WIFE/CMH

## 2019-11-16 ENCOUNTER — Ambulatory Visit (INDEPENDENT_AMBULATORY_CARE_PROVIDER_SITE_OTHER): Payer: PPO | Admitting: Vascular Surgery

## 2019-11-16 ENCOUNTER — Ambulatory Visit: Payer: PPO

## 2019-11-16 ENCOUNTER — Ambulatory Visit (HOSPITAL_COMMUNITY)
Admission: RE | Admit: 2019-11-16 | Discharge: 2019-11-16 | Disposition: A | Discharge status: Home / Self Care | Payer: PPO | Source: Ambulatory Visit | Attending: Surgery | Admitting: Surgery

## 2019-11-16 ENCOUNTER — Other Ambulatory Visit: Payer: Self-pay

## 2019-11-16 ENCOUNTER — Encounter (HOSPITAL_COMMUNITY): Payer: PPO

## 2019-11-16 ENCOUNTER — Encounter: Payer: Self-pay | Admitting: Vascular Surgery

## 2019-11-16 VITALS — BP 201/71 | HR 78 | Temp 97.7°F | Resp 20 | Ht 70.0 in | Wt 182.0 lb

## 2019-11-16 DIAGNOSIS — I6523 Occlusion and stenosis of bilateral carotid arteries: Secondary | ICD-10-CM

## 2019-11-16 NOTE — Progress Notes (Signed)
[facility] and [facility] Specialist of [place]  Patient name: Troy Thompson MRN: [id number] DOB: 05/15/1925 Sex: male  REASON FOR VISIT: Follow-up carotid disease  HPI: Troy Thompson is a 86 y.o. male here today for follow-up.  He will look quite good today he is having no new medical difficulties and specifically no neurologic deficits.  I am also speaking with his granddaughter by telephone due to Covid visitation restrictions.  He had is not having any cardiac difficulty or any new neurologic events.  Did have a history of retinal artery occlusion due to severe carotid disease and underwent right carotid endarterectomy in 2009  Past Medical History:  Diagnosis Date  . Carotid artery occlusion   . Hypertension   . Pneumonia   . Retinal embolus, right 12/30/2007   Sudden onset of blindness   . Stroke [facility])     Family History  Problem Relation Age of Onset  . Colon cancer Father        died at 56  . Cancer Father        Colon  . Heart attack Mother   . Hypertension Daughter   . Diabetes Son     SOCIAL HISTORY: Social History   Tobacco Use  . Smoking status: Former Smoker    Years: 22.00    Types: Cigarettes  . Smokeless tobacco: Never Used  Substance Use Topics  . Alcohol use: No    No Known Allergies  Current Outpatient Medications  Medication Sig Dispense Refill  . amLODipine (NORVASC) 5 MG tablet Take 5 mg by mouth daily.    Marland Kitchen aspirin 81 MG tablet Take 81 mg by mouth at bedtime.     . cloNIDine (CATAPRES) 0.1 MG tablet TAKE 1 TABLET BY MOUTH AT BEDTIME FOR SEVEN DAYS, THEN TAKE 1 TABLET TWICE DAILY    . hydrALAZINE (APRESOLINE) 25 MG tablet Take 25 mg by mouth 2 (two) times daily.    . hydrochlorothiazide (MICROZIDE) 12.5 MG capsule TAKE TWO CAPSULES BY MOUTH EVERY DAY - TAKE WITH LOSARTAN    . losartan (COZAAR) 50 MG tablet TAKE TWO TABLETS BY MOUTH EVERY DAY -TAKE WITH HCTZ    . meclizine (ANTIVERT) 25 MG tablet  Take 25 mg by mouth 3 (three) times daily as needed.  2  . Multiple Vitamins-Minerals (PRESERVISION AREDS 2 PO) Take 1 tablet by mouth 2 (two) times daily.    Marland Kitchen omeprazole (PRILOSEC) 20 MG capsule 20 mg daily.    . simvastatin (ZOCOR) 20 MG tablet Take 20 mg by mouth at bedtime.     . valsartan-hydrochlorothiazide (DIOVAN-HCT) 320-25 MG per tablet Take 1 tablet by mouth every morning.      No current facility-administered medications for this visit.    REVIEW OF SYSTEMS:  [X]  denotes positive finding, [ ]  denotes negative finding Cardiac  Comments:  Chest pain or chest pressure:    Shortness of breath upon exertion:    Short of breath when lying flat:    Irregular heart rhythm:        Vascular    Pain in calf, thigh, or hip brought on by ambulation:    Pain in feet at night that wakes you up from your sleep:     Blood clot in your veins:    Leg swelling:           PHYSICAL EXAM: Vitals:   11/16/19 1222 11/16/19 1225  BP: (!) 203/70 (!) 201/71  Pulse: 78   Resp: 20  Temp: 97.7 F (36.5 C)   SpO2: 98%   Weight: 182 lb (82.6 kg)   Height: 5\' 10"  (1.778 m)     GENERAL: The patient is a well-nourished male, in no acute distress. The vital signs are documented above. CARDIOVASCULAR: Carotid arteries without bruits bilaterally.  Well-healed right neck incision PULMONARY: There is good air exchange  MUSCULOSKELETAL: There are no major deformities or cyanosis. NEUROLOGIC: No focal weakness or paresthesias are detected. SKIN: There are no ulcers or rashes noted. PSYCHIATRIC: The patient has a normal affect.  DATA:  Carotid duplex today was reviewed with the patient.  This shows widely patent endarterectomy with no evidence of stenosis.  His left carotid has a predicted 1 to 39% stenosis with calcification  MEDICAL ISSUES: Stable overall.  He did have a CT angiogram approximately 7 to 8 months ago which showed moderate 50 to 60% stenosis in his left internal carotid artery.   He certainly has had no evidence of progression and no symptoms related to this.  He knows to report immediately to the emergency room should he develop any neurologic deficits.  Otherwise we will see him again in 1 year with repeat carotid duplex evaluation    Rosetta Posner, MD [facility] Tel [phone] Pager [phone]

## 2019-11-17 ENCOUNTER — Other Ambulatory Visit: Payer: Self-pay | Admitting: *Deleted

## 2019-11-17 DIAGNOSIS — R2689 Other abnormalities of gait and mobility: Secondary | ICD-10-CM | POA: Diagnosis not present

## 2019-11-17 DIAGNOSIS — I6523 Occlusion and stenosis of bilateral carotid arteries: Secondary | ICD-10-CM

## 2019-12-01 DIAGNOSIS — R2689 Other abnormalities of gait and mobility: Secondary | ICD-10-CM | POA: Diagnosis not present

## 2019-12-14 DIAGNOSIS — M25562 Pain in left knee: Secondary | ICD-10-CM | POA: Diagnosis not present

## 2019-12-21 DIAGNOSIS — R42 Dizziness and giddiness: Secondary | ICD-10-CM | POA: Diagnosis not present

## 2019-12-21 DIAGNOSIS — I10 Essential (primary) hypertension: Secondary | ICD-10-CM | POA: Diagnosis not present

## 2020-01-06 DIAGNOSIS — M25562 Pain in left knee: Secondary | ICD-10-CM | POA: Diagnosis not present

## 2020-01-12 DIAGNOSIS — M25562 Pain in left knee: Secondary | ICD-10-CM | POA: Diagnosis not present

## 2020-04-21 DIAGNOSIS — M1991 Primary osteoarthritis, unspecified site: Secondary | ICD-10-CM | POA: Diagnosis not present

## 2020-04-21 DIAGNOSIS — I10 Essential (primary) hypertension: Secondary | ICD-10-CM | POA: Diagnosis not present

## 2020-05-03 DIAGNOSIS — M7122 Synovial cyst of popliteal space [Baker], left knee: Secondary | ICD-10-CM | POA: Diagnosis not present

## 2020-05-24 DIAGNOSIS — Z20822 Contact with and (suspected) exposure to covid-19: Secondary | ICD-10-CM | POA: Diagnosis not present

## 2020-05-31 ENCOUNTER — Encounter (INDEPENDENT_AMBULATORY_CARE_PROVIDER_SITE_OTHER): Payer: PPO | Admitting: Ophthalmology

## 2020-07-28 DIAGNOSIS — Z23 Encounter for immunization: Secondary | ICD-10-CM | POA: Diagnosis not present

## 2020-09-14 DIAGNOSIS — I6781 Acute cerebrovascular insufficiency: Secondary | ICD-10-CM | POA: Diagnosis not present

## 2020-09-14 DIAGNOSIS — I10 Essential (primary) hypertension: Secondary | ICD-10-CM | POA: Diagnosis not present

## 2020-09-14 DIAGNOSIS — M1990 Unspecified osteoarthritis, unspecified site: Secondary | ICD-10-CM | POA: Diagnosis not present

## 2020-09-14 DIAGNOSIS — Z79899 Other long term (current) drug therapy: Secondary | ICD-10-CM | POA: Diagnosis not present

## 2020-09-14 DIAGNOSIS — K219 Gastro-esophageal reflux disease without esophagitis: Secondary | ICD-10-CM | POA: Diagnosis not present

## 2020-09-14 DIAGNOSIS — E785 Hyperlipidemia, unspecified: Secondary | ICD-10-CM | POA: Diagnosis not present

## 2020-09-21 DIAGNOSIS — N183 Chronic kidney disease, stage 3 unspecified: Secondary | ICD-10-CM | POA: Diagnosis not present

## 2020-09-21 DIAGNOSIS — Z0001 Encounter for general adult medical examination with abnormal findings: Secondary | ICD-10-CM | POA: Diagnosis not present

## 2020-09-21 DIAGNOSIS — I700 Atherosclerosis of aorta: Secondary | ICD-10-CM | POA: Diagnosis not present

## 2020-09-21 DIAGNOSIS — E785 Hyperlipidemia, unspecified: Secondary | ICD-10-CM | POA: Diagnosis not present

## 2020-09-21 DIAGNOSIS — Z6827 Body mass index (BMI) 27.0-27.9, adult: Secondary | ICD-10-CM | POA: Diagnosis not present

## 2020-09-21 DIAGNOSIS — I10 Essential (primary) hypertension: Secondary | ICD-10-CM | POA: Diagnosis not present

## 2020-09-21 DIAGNOSIS — M7121 Synovial cyst of popliteal space [Baker], right knee: Secondary | ICD-10-CM | POA: Diagnosis not present

## 2021-01-22 DIAGNOSIS — M25562 Pain in left knee: Secondary | ICD-10-CM | POA: Diagnosis not present

## 2021-01-22 DIAGNOSIS — M1990 Unspecified osteoarthritis, unspecified site: Secondary | ICD-10-CM | POA: Diagnosis not present

## 2021-02-03 DIAGNOSIS — E785 Hyperlipidemia, unspecified: Secondary | ICD-10-CM | POA: Diagnosis not present

## 2021-02-03 DIAGNOSIS — K219 Gastro-esophageal reflux disease without esophagitis: Secondary | ICD-10-CM | POA: Diagnosis not present

## 2021-02-03 DIAGNOSIS — I10 Essential (primary) hypertension: Secondary | ICD-10-CM | POA: Diagnosis not present

## 2021-07-17 DIAGNOSIS — Z23 Encounter for immunization: Secondary | ICD-10-CM | POA: Diagnosis not present

## 2021-08-06 DIAGNOSIS — K219 Gastro-esophageal reflux disease without esophagitis: Secondary | ICD-10-CM | POA: Diagnosis not present

## 2021-08-06 DIAGNOSIS — I10 Essential (primary) hypertension: Secondary | ICD-10-CM | POA: Diagnosis not present

## 2021-08-06 DIAGNOSIS — E785 Hyperlipidemia, unspecified: Secondary | ICD-10-CM | POA: Diagnosis not present

## 2021-09-20 DIAGNOSIS — I700 Atherosclerosis of aorta: Secondary | ICD-10-CM | POA: Diagnosis not present

## 2021-09-20 DIAGNOSIS — E785 Hyperlipidemia, unspecified: Secondary | ICD-10-CM | POA: Diagnosis not present

## 2021-09-20 DIAGNOSIS — N183 Chronic kidney disease, stage 3 unspecified: Secondary | ICD-10-CM | POA: Diagnosis not present

## 2021-09-20 DIAGNOSIS — I2510 Atherosclerotic heart disease of native coronary artery without angina pectoris: Secondary | ICD-10-CM | POA: Diagnosis not present

## 2021-09-20 DIAGNOSIS — Z79899 Other long term (current) drug therapy: Secondary | ICD-10-CM | POA: Diagnosis not present

## 2021-09-20 DIAGNOSIS — Z125 Encounter for screening for malignant neoplasm of prostate: Secondary | ICD-10-CM | POA: Diagnosis not present

## 2021-09-20 DIAGNOSIS — I639 Cerebral infarction, unspecified: Secondary | ICD-10-CM | POA: Diagnosis not present

## 2021-09-20 DIAGNOSIS — I10 Essential (primary) hypertension: Secondary | ICD-10-CM | POA: Diagnosis not present

## 2021-09-20 DIAGNOSIS — M1990 Unspecified osteoarthritis, unspecified site: Secondary | ICD-10-CM | POA: Diagnosis not present

## 2021-09-20 DIAGNOSIS — K219 Gastro-esophageal reflux disease without esophagitis: Secondary | ICD-10-CM | POA: Diagnosis not present

## 2021-09-27 DIAGNOSIS — E785 Hyperlipidemia, unspecified: Secondary | ICD-10-CM | POA: Diagnosis not present

## 2021-09-27 DIAGNOSIS — I10 Essential (primary) hypertension: Secondary | ICD-10-CM | POA: Diagnosis not present

## 2021-09-27 DIAGNOSIS — Z8673 Personal history of transient ischemic attack (TIA), and cerebral infarction without residual deficits: Secondary | ICD-10-CM | POA: Diagnosis not present

## 2021-09-27 DIAGNOSIS — Z6827 Body mass index (BMI) 27.0-27.9, adult: Secondary | ICD-10-CM | POA: Diagnosis not present

## 2021-09-27 DIAGNOSIS — N3000 Acute cystitis without hematuria: Secondary | ICD-10-CM | POA: Diagnosis not present

## 2021-09-27 DIAGNOSIS — I700 Atherosclerosis of aorta: Secondary | ICD-10-CM | POA: Diagnosis not present

## 2021-09-27 DIAGNOSIS — N1831 Chronic kidney disease, stage 3a: Secondary | ICD-10-CM | POA: Diagnosis not present

## 2021-10-05 DIAGNOSIS — K219 Gastro-esophageal reflux disease without esophagitis: Secondary | ICD-10-CM | POA: Diagnosis not present

## 2021-10-05 DIAGNOSIS — E785 Hyperlipidemia, unspecified: Secondary | ICD-10-CM | POA: Diagnosis not present

## 2021-10-05 DIAGNOSIS — I10 Essential (primary) hypertension: Secondary | ICD-10-CM | POA: Diagnosis not present

## 2021-12-27 DIAGNOSIS — E785 Hyperlipidemia, unspecified: Secondary | ICD-10-CM | POA: Diagnosis not present

## 2021-12-27 DIAGNOSIS — I2510 Atherosclerotic heart disease of native coronary artery without angina pectoris: Secondary | ICD-10-CM | POA: Diagnosis not present

## 2021-12-27 DIAGNOSIS — I639 Cerebral infarction, unspecified: Secondary | ICD-10-CM | POA: Diagnosis not present

## 2021-12-27 DIAGNOSIS — D550 Anemia due to glucose-6-phosphate dehydrogenase [G6PD] deficiency: Secondary | ICD-10-CM | POA: Diagnosis not present

## 2021-12-27 DIAGNOSIS — I10 Essential (primary) hypertension: Secondary | ICD-10-CM | POA: Diagnosis not present

## 2021-12-27 DIAGNOSIS — Z79899 Other long term (current) drug therapy: Secondary | ICD-10-CM | POA: Diagnosis not present

## 2021-12-27 DIAGNOSIS — N1831 Chronic kidney disease, stage 3a: Secondary | ICD-10-CM | POA: Diagnosis not present

## 2022-01-10 DIAGNOSIS — D696 Thrombocytopenia, unspecified: Secondary | ICD-10-CM | POA: Diagnosis not present

## 2022-01-10 DIAGNOSIS — I10 Essential (primary) hypertension: Secondary | ICD-10-CM | POA: Diagnosis not present

## 2022-01-10 DIAGNOSIS — D649 Anemia, unspecified: Secondary | ICD-10-CM | POA: Diagnosis not present

## 2022-01-17 DIAGNOSIS — M1712 Unilateral primary osteoarthritis, left knee: Secondary | ICD-10-CM | POA: Diagnosis not present

## 2022-01-24 DIAGNOSIS — E785 Hyperlipidemia, unspecified: Secondary | ICD-10-CM | POA: Diagnosis not present

## 2022-01-24 DIAGNOSIS — M1990 Unspecified osteoarthritis, unspecified site: Secondary | ICD-10-CM | POA: Diagnosis not present

## 2022-01-24 DIAGNOSIS — R32 Unspecified urinary incontinence: Secondary | ICD-10-CM | POA: Diagnosis not present

## 2022-01-24 DIAGNOSIS — E261 Secondary hyperaldosteronism: Secondary | ICD-10-CM | POA: Diagnosis not present

## 2022-01-24 DIAGNOSIS — I10 Essential (primary) hypertension: Secondary | ICD-10-CM | POA: Diagnosis not present

## 2022-01-24 DIAGNOSIS — E663 Overweight: Secondary | ICD-10-CM | POA: Diagnosis not present

## 2022-01-24 DIAGNOSIS — Z87891 Personal history of nicotine dependence: Secondary | ICD-10-CM | POA: Diagnosis not present

## 2022-01-24 DIAGNOSIS — R42 Dizziness and giddiness: Secondary | ICD-10-CM | POA: Diagnosis not present

## 2022-01-24 DIAGNOSIS — Z7982 Long term (current) use of aspirin: Secondary | ICD-10-CM | POA: Diagnosis not present

## 2022-01-24 DIAGNOSIS — K219 Gastro-esophageal reflux disease without esophagitis: Secondary | ICD-10-CM | POA: Diagnosis not present

## 2022-01-24 DIAGNOSIS — H3530 Unspecified macular degeneration: Secondary | ICD-10-CM | POA: Diagnosis not present

## 2022-01-24 DIAGNOSIS — I499 Cardiac arrhythmia, unspecified: Secondary | ICD-10-CM | POA: Diagnosis not present

## 2022-05-07 DIAGNOSIS — N1831 Chronic kidney disease, stage 3a: Secondary | ICD-10-CM | POA: Diagnosis not present

## 2022-05-07 DIAGNOSIS — D500 Iron deficiency anemia secondary to blood loss (chronic): Secondary | ICD-10-CM | POA: Diagnosis not present

## 2022-05-07 DIAGNOSIS — D696 Thrombocytopenia, unspecified: Secondary | ICD-10-CM | POA: Diagnosis not present

## 2022-05-07 DIAGNOSIS — I10 Essential (primary) hypertension: Secondary | ICD-10-CM | POA: Diagnosis not present

## 2022-05-07 DIAGNOSIS — M1990 Unspecified osteoarthritis, unspecified site: Secondary | ICD-10-CM | POA: Diagnosis not present

## 2022-06-11 DIAGNOSIS — Z23 Encounter for immunization: Secondary | ICD-10-CM | POA: Diagnosis not present

## 2022-06-11 DIAGNOSIS — I10 Essential (primary) hypertension: Secondary | ICD-10-CM | POA: Diagnosis not present

## 2022-06-11 DIAGNOSIS — N1831 Chronic kidney disease, stage 3a: Secondary | ICD-10-CM | POA: Diagnosis not present

## 2022-08-15 DIAGNOSIS — Z85820 Personal history of malignant melanoma of skin: Secondary | ICD-10-CM | POA: Diagnosis not present

## 2022-08-15 DIAGNOSIS — X32XXXD Exposure to sunlight, subsequent encounter: Secondary | ICD-10-CM | POA: Diagnosis not present

## 2022-08-15 DIAGNOSIS — D225 Melanocytic nevi of trunk: Secondary | ICD-10-CM | POA: Diagnosis not present

## 2022-08-15 DIAGNOSIS — Z1283 Encounter for screening for malignant neoplasm of skin: Secondary | ICD-10-CM | POA: Diagnosis not present

## 2022-08-15 DIAGNOSIS — L570 Actinic keratosis: Secondary | ICD-10-CM | POA: Diagnosis not present

## 2022-08-15 DIAGNOSIS — C4402 Squamous cell carcinoma of skin of lip: Secondary | ICD-10-CM | POA: Diagnosis not present

## 2022-08-15 DIAGNOSIS — Z08 Encounter for follow-up examination after completed treatment for malignant neoplasm: Secondary | ICD-10-CM | POA: Diagnosis not present

## 2022-09-27 DIAGNOSIS — D500 Iron deficiency anemia secondary to blood loss (chronic): Secondary | ICD-10-CM | POA: Diagnosis not present

## 2022-09-27 DIAGNOSIS — Z8673 Personal history of transient ischemic attack (TIA), and cerebral infarction without residual deficits: Secondary | ICD-10-CM | POA: Diagnosis not present

## 2022-09-27 DIAGNOSIS — I10 Essential (primary) hypertension: Secondary | ICD-10-CM | POA: Diagnosis not present

## 2022-09-27 DIAGNOSIS — I2510 Atherosclerotic heart disease of native coronary artery without angina pectoris: Secondary | ICD-10-CM | POA: Diagnosis not present

## 2022-09-27 DIAGNOSIS — K219 Gastro-esophageal reflux disease without esophagitis: Secondary | ICD-10-CM | POA: Diagnosis not present

## 2022-09-27 DIAGNOSIS — E785 Hyperlipidemia, unspecified: Secondary | ICD-10-CM | POA: Diagnosis not present

## 2022-09-27 DIAGNOSIS — N1831 Chronic kidney disease, stage 3a: Secondary | ICD-10-CM | POA: Diagnosis not present

## 2022-09-27 DIAGNOSIS — Z125 Encounter for screening for malignant neoplasm of prostate: Secondary | ICD-10-CM | POA: Diagnosis not present

## 2022-09-27 DIAGNOSIS — M1990 Unspecified osteoarthritis, unspecified site: Secondary | ICD-10-CM | POA: Diagnosis not present

## 2022-09-27 DIAGNOSIS — Z79899 Other long term (current) drug therapy: Secondary | ICD-10-CM | POA: Diagnosis not present

## 2022-09-27 DIAGNOSIS — I700 Atherosclerosis of aorta: Secondary | ICD-10-CM | POA: Diagnosis not present

## 2022-10-04 DIAGNOSIS — I10 Essential (primary) hypertension: Secondary | ICD-10-CM | POA: Diagnosis not present

## 2022-10-04 DIAGNOSIS — D649 Anemia, unspecified: Secondary | ICD-10-CM | POA: Diagnosis not present

## 2022-10-04 DIAGNOSIS — I480 Paroxysmal atrial fibrillation: Secondary | ICD-10-CM | POA: Diagnosis not present

## 2022-10-04 DIAGNOSIS — D696 Thrombocytopenia, unspecified: Secondary | ICD-10-CM | POA: Diagnosis not present

## 2022-10-04 DIAGNOSIS — I700 Atherosclerosis of aorta: Secondary | ICD-10-CM | POA: Diagnosis not present

## 2022-10-04 DIAGNOSIS — E785 Hyperlipidemia, unspecified: Secondary | ICD-10-CM | POA: Diagnosis not present

## 2022-10-08 ENCOUNTER — Inpatient Hospital Stay: Payer: PPO | Attending: Hematology | Admitting: Hematology

## 2022-10-08 ENCOUNTER — Inpatient Hospital Stay: Payer: PPO

## 2022-10-08 VITALS — BP 145/52 | HR 63 | Temp 97.7°F | Resp 17 | Ht 67.0 in | Wt 171.6 lb

## 2022-10-08 DIAGNOSIS — Z79899 Other long term (current) drug therapy: Secondary | ICD-10-CM | POA: Insufficient documentation

## 2022-10-08 DIAGNOSIS — Z87891 Personal history of nicotine dependence: Secondary | ICD-10-CM | POA: Insufficient documentation

## 2022-10-08 DIAGNOSIS — Z8673 Personal history of transient ischemic attack (TIA), and cerebral infarction without residual deficits: Secondary | ICD-10-CM | POA: Diagnosis not present

## 2022-10-08 DIAGNOSIS — D539 Nutritional anemia, unspecified: Secondary | ICD-10-CM

## 2022-10-08 DIAGNOSIS — Z7982 Long term (current) use of aspirin: Secondary | ICD-10-CM | POA: Insufficient documentation

## 2022-10-08 DIAGNOSIS — D696 Thrombocytopenia, unspecified: Secondary | ICD-10-CM | POA: Diagnosis not present

## 2022-10-08 DIAGNOSIS — Z800 Family history of malignant neoplasm of digestive organs: Secondary | ICD-10-CM | POA: Insufficient documentation

## 2022-10-08 DIAGNOSIS — I129 Hypertensive chronic kidney disease with stage 1 through stage 4 chronic kidney disease, or unspecified chronic kidney disease: Secondary | ICD-10-CM | POA: Diagnosis not present

## 2022-10-08 LAB — RETICULOCYTES
Immature Retic Fract: 24.7 % — ABNORMAL HIGH (ref 2.3–15.9)
RBC.: 2.91 MIL/uL — ABNORMAL LOW (ref 4.22–5.81)
Retic Count, Absolute: 57 10*3/uL (ref 19.0–186.0)
Retic Ct Pct: 2 % (ref 0.4–3.1)

## 2022-10-08 LAB — CBC WITH DIFFERENTIAL/PLATELET
Abs Immature Granulocytes: 0.02 10*3/uL (ref 0.00–0.07)
Basophils Absolute: 0 10*3/uL (ref 0.0–0.1)
Basophils Relative: 0 %
Eosinophils Absolute: 0.1 10*3/uL (ref 0.0–0.5)
Eosinophils Relative: 2 %
HCT: 29.7 % — ABNORMAL LOW (ref 39.0–52.0)
Hemoglobin: 9.4 g/dL — ABNORMAL LOW (ref 13.0–17.0)
Immature Granulocytes: 1 %
Lymphocytes Relative: 20 %
Lymphs Abs: 0.8 10*3/uL (ref 0.7–4.0)
MCH: 32.2 pg (ref 26.0–34.0)
MCHC: 31.6 g/dL (ref 30.0–36.0)
MCV: 101.7 fL — ABNORMAL HIGH (ref 80.0–100.0)
Monocytes Absolute: 0.5 10*3/uL (ref 0.1–1.0)
Monocytes Relative: 13 %
Neutro Abs: 2.6 10*3/uL (ref 1.7–7.7)
Neutrophils Relative %: 64 %
Platelets: 110 10*3/uL — ABNORMAL LOW (ref 150–400)
RBC: 2.92 MIL/uL — ABNORMAL LOW (ref 4.22–5.81)
RDW: 14.1 % (ref 11.5–15.5)
WBC: 4 10*3/uL (ref 4.0–10.5)
nRBC: 0 % (ref 0.0–0.2)

## 2022-10-08 LAB — IRON AND TIBC
Iron: 60 ug/dL (ref 45–182)
Saturation Ratios: 15 % — ABNORMAL LOW (ref 17.9–39.5)
TIBC: 399 ug/dL (ref 250–450)
UIBC: 339 ug/dL

## 2022-10-08 LAB — VITAMIN B12: Vitamin B-12: 178 pg/mL — ABNORMAL LOW (ref 180–914)

## 2022-10-08 LAB — FERRITIN: Ferritin: 27 ng/mL (ref 24–336)

## 2022-10-08 LAB — FOLATE: Folate: 27.4 ng/mL (ref >5.9)

## 2022-10-08 LAB — LACTATE DEHYDROGENASE: LDH: 126 U/L (ref 98–192)

## 2022-10-08 NOTE — Progress Notes (Signed)
CONSULT NOTE  Patient Care Team: Asencion Noble, MD as PCP - General (Internal Medicine) Gala Romney Cristopher Estimable, MD as Consulting Physician (Gastroenterology)  CHIEF COMPLAINTS/PURPOSE OF CONSULTATION:  Macrocytic anemia and thrombocytopenia  HISTORY OF PRESENTING ILLNESS:  Troy Thompson 89 y.o. male is seen at the request of Dr. Willey Blade for further workup and management of microcytic anemia and thrombocytopenia.  CBC on 09/27/2022 showed hemoglobin 9.9, MCV 99, PLT 119, WBC 4.  Denies any bleeding per rectum or melena.  Colonoscopy was more than 10 years ago.  Has lost about 15 pounds in the last 1 and half year since his wife passed away.  He is not on any iron supplements.  Denies any ice pica.  He lives at home independently and does his ADLs and IADLs.  He denies any easy bruising or bleeding.   MEDICAL HISTORY:  Past Medical History:  Diagnosis Date   Carotid artery occlusion    Hypertension    Pneumonia    Retinal embolus, right 12/30/2007   Sudden onset of blindness    Stroke [facility])     SURGICAL HISTORY: Past Surgical History:  Procedure Laterality Date   CAROTID ENDARTERECTOMY  01/26/2008   Right    CHOLECYSTECTOMY N/A 01/17/2014   Procedure: LAPAROSCOPIC CHOLECYSTECTOMY;  Surgeon: Jamesetta So, MD;  Location: [facility];  Service: General;  Laterality: N/A;   COLONOSCOPY  03/15/2008   [id number] rectum/Left-sided transverse diverticula/Diminutive cecal polyp status post cold biopsy(Tubular adenoma)   COLONOSCOPY  09/24/2002   [id number]:[id number] rectum/Left-sided diverticulum/Polyp at the splenic flexure removed    HERNIA REPAIR     JOINT REPLACEMENT Right 2005   knee   PROSTATE SURGERY     30 yrs ago   TRANSURETHRAL RESECTION OF PROSTATE      SOCIAL HISTORY: Social History   Socioeconomic History   Marital status: Married    Spouse name: Not on file   Number of children: Not on file   Years of education: Not on file   Highest education level: Not on file  Occupational  History   Not on file  Tobacco Use   Smoking status: Former    Years: 22.00    Types: Cigarettes   Smokeless tobacco: Never  Vaping Use   Vaping Use: Never used  Substance and Sexual Activity   Alcohol use: No   Drug use: No   Sexual activity: Never  Other Topics Concern   Not on file  Social History Narrative   Not on file   Social Determinants of Health   Financial Resource Strain: Not on file  Food Insecurity: No Food Insecurity (10/08/2022)   Hunger Vital Sign    Worried About Running Out of Food in the Last Year: Never true    Ran Out of Food in the Last Year: Never true  Transportation Needs: No Transportation Needs (10/08/2022)   PRAPARE - Hydrologist (Medical): No    Lack of Transportation (Non-Medical): No  Physical Activity: Not on file  Stress: Not on file  Social Connections: Not on file  Intimate Partner Violence: Not At Risk (10/08/2022)   Humiliation, Afraid, Rape, and Kick questionnaire    Fear of Current or Ex-Partner: No    Emotionally Abused: No    Physically Abused: No    Sexually Abused: No    FAMILY HISTORY: Family History  Problem Relation Age of Onset   Colon cancer Father        died at 28  Cancer Father        Colon   Heart attack Mother    Hypertension Daughter    Diabetes Son     ALLERGIES:  has No Known Allergies.  MEDICATIONS:  Current Outpatient Medications  Medication Sig Dispense Refill   amLODipine (NORVASC) 5 MG tablet Take 5 mg by mouth daily.     aspirin 81 MG tablet Take 81 mg by mouth at bedtime.      cloNIDine (CATAPRES) 0.1 MG tablet TAKE 1 TABLET BY MOUTH AT BEDTIME FOR SEVEN DAYS, THEN TAKE 1 TABLET TWICE DAILY     hydrALAZINE (APRESOLINE) 25 MG tablet Take 25 mg by mouth 2 (two) times daily.     hydrochlorothiazide (MICROZIDE) 12.5 MG capsule TAKE TWO CAPSULES BY MOUTH EVERY DAY - TAKE WITH LOSARTAN     losartan (COZAAR) 50 MG tablet TAKE TWO TABLETS BY MOUTH EVERY DAY -TAKE WITH HCTZ      meclizine (ANTIVERT) 25 MG tablet Take 25 mg by mouth 3 (three) times daily as needed.  2   Multiple Vitamins-Minerals (PRESERVISION AREDS 2 PO) Take 1 tablet by mouth 2 (two) times daily.     omeprazole (PRILOSEC) 20 MG capsule 20 mg daily.     simvastatin (ZOCOR) 20 MG tablet Take 20 mg by mouth at bedtime.      valsartan-hydrochlorothiazide (DIOVAN-HCT) 320-25 MG per tablet Take 1 tablet by mouth every morning.      No current facility-administered medications for this visit.    REVIEW OF SYSTEMS:   Constitutional: Denies fevers, chills or abnormal night sweats Eyes: Denies blurriness of vision, double vision or watery eyes Ears, nose, mouth, throat, and face: Denies mucositis or sore throat Respiratory: Occasional shortness of breath. Cardiovascular: Denies palpitation, chest discomfort or lower extremity swelling Gastrointestinal:  Denies nausea, heartburn or change in bowel habits Skin: Denies abnormal skin rashes Lymphatics: Denies new lymphadenopathy or easy bruising Neurological:Denies numbness, tingling or new weaknesses Behavioral/Psych: Mood is stable, no new changes  All other systems were reviewed with the patient and are negative.  PHYSICAL EXAMINATION: ECOG PERFORMANCE STATUS: 1 - Symptomatic but completely ambulatory  Vitals:   10/08/22 0829  BP: (!) 145/52  Pulse: 63  Resp: 17  Temp: 97.7 F (36.5 C)  SpO2: 100%   Filed Weights   10/08/22 0829  Weight: 171 lb 9.6 oz (77.8 kg)    GENERAL:alert, no distress and comfortable SKIN: skin color, texture, turgor are normal, no rashes or significant lesions EYES: normal, conjunctiva are pink and non-injected, sclera clear OROPHARYNX:no exudate, no erythema and lips, buccal mucosa, and tongue normal  NECK: supple, thyroid normal size, non-tender, without nodularity LYMPH:  no palpable lymphadenopathy in the cervical, axillary or inguinal LUNGS: clear to auscultation and percussion with normal breathing  effort HEART: regular rate & rhythm and no murmurs and no lower extremity edema ABDOMEN:abdomen soft, non-tender and normal bowel sounds Musculoskeletal:no cyanosis of digits and no clubbing  PSYCH: alert & oriented x 3 with fluent speech NEURO: no focal motor/sensory deficits  LABORATORY DATA:  I have reviewed the data as listed Recent Results (from the past 2160 hour(s))  Reticulocytes     Status: Abnormal   Collection Time: 10/08/22  8:48 AM  Result Value Ref Range   Retic Ct Pct 2.0 0.4 - 3.1 %   RBC. 2.91 (L) 4.22 - 5.81 MIL/uL   Retic Count, Absolute 57.0 19.0 - 186.0 K/uL   Immature Retic Fract 24.7 (H) 2.3 - 15.9 %  Comment: Performed at [facility], [address], [place], [place] [address]  Lactate dehydrogenase     Status: None   Collection Time: 10/08/22  8:48 AM  Result Value Ref Range   LDH 126 98 - 192 U/L    Comment: Performed at [facility], [address] [place], [place] [address]  CBC with Differential     Status: Abnormal   Collection Time: 10/08/22  8:48 AM  Result Value Ref Range   WBC 4.0 4.0 - 10.5 K/uL   RBC 2.92 (L) 4.22 - 5.81 MIL/uL   Hemoglobin 9.4 (L) 13.0 - 17.0 g/dL   HCT 29.7 (L) 39.0 - 52.0 %   MCV 101.7 (H) 80.0 - 100.0 fL   MCH 32.2 26.0 - 34.0 pg   MCHC 31.6 30.0 - 36.0 g/dL   RDW 14.1 11.5 - 15.5 %   Platelets 110 (L) 150 - 400 K/uL    Comment: Immature Platelet Fraction may be clinically indicated, consider ordering this additional test [id number]    nRBC 0.0 0.0 - 0.2 %   Neutrophils Relative % 64 %   Neutro Abs 2.6 1.7 - 7.7 K/uL   Lymphocytes Relative 20 %   Lymphs Abs 0.8 0.7 - 4.0 K/uL   Monocytes Relative 13 %   Monocytes Absolute 0.5 0.1 - 1.0 K/uL   Eosinophils Relative 2 %   Eosinophils Absolute 0.1 0.0 - 0.5 K/uL   Basophils Relative 0 %   Basophils Absolute 0.0 0.0 - 0.1 K/uL   Immature Granulocytes 1 %   Abs Immature Granulocytes 0.02 0.00 - 0.07 K/uL    Comment: Performed at [facility], [address],  [place], [place] [address]    RADIOGRAPHIC STUDIES: I have personally reviewed the radiological images as listed and agreed with the findings in the report. No results found.  ASSESSMENT:  1.  Macrocytic anemia and mild thrombocytopenia: - Patient seen at the request of Dr. Willey Blade - CBC (09/27/2022): Hb-9.9, MCV-99, creatinine-1.24 (GFR of 54), PLT-119, WBC-4 - CBC (05/07/2022): Hb-10.5, MCV-95, PLT-137, WBC-4.4 - CBC (12/27/2021): Hb-10.8, MCV-95, PLT-143 - Colonoscopy was more than 10 years ago.  Denies any bleeding per rectum or melena.  Not on iron supplements.  No ice pica. - He has mild CKD with a GFR 50-60 ml per minute  2.  Social/family history: - He lives at home independently.  His wife passed away about a year ago.  He is a retired Cabin crew and worked on Engineer, production.  He is independent of ADLs and IADLs.  He quit smoking 60 years ago. - Father had colon cancer.  PLAN:  1.  Macrocytic anemia and mild thrombocytopenia: - We reviewed his outside labs from Dr. Ria Comment office. - He has gradual worsening of macrocytic anemia, likely from renal dysfunction, age-related anemia and early MDS. - Will check for nutritional deficiencies, hemolysis and the bone marrow infiltrative process. - RTC 1 week to discuss results and further plan.      All questions were answered. The patient knows to call the clinic with any problems, questions or concerns.      Derek Jack, MD 10/08/22 10:56 AM

## 2022-10-08 NOTE — Patient Instructions (Signed)
[place] at [facility] Discharge Instructions   You were seen and examined today by Dr. Delton Coombes. He is a blood specialist who is seeing you today on behalf of Dr. Willey Blade for anemia (low blood).   We will obtain additional blood work today to investigate this further.   We will see you back in 2-3 weeks to review the results of today's blood work.    Thank you for choosing [place] at [facility] to provide your oncology and hematology care.  To afford each patient quality time with our provider, please arrive at least 15 minutes before your scheduled appointment time.   If you have a lab appointment with the [place] please come in thru the Main Entrance and check in at the main information desk.  You need to re-schedule your appointment should you arrive 10 or more minutes late.  We strive to give you quality time with our providers, and arriving late affects you and other patients whose appointments are after yours.  Also, if you no show three or more times for appointments you may be dismissed from the clinic at the providers discretion.     Again, thank you for choosing [facility].  Our hope is that these requests will decrease the amount of time that you wait before being seen by our physicians.       _____________________________________________________________  Should you have questions after your visit to [facility], please contact our office at [phone] and follow the prompts.  Our office hours are 8:00 a.m. and 4:30 p.m. Monday - Friday.  Please note that voicemails left after 4:00 p.m. may not be returned until the following business day.  We are closed weekends and major holidays.  You do have access to a nurse 24-7, just call the main number to the clinic [phone] and do not press any options, hold on the line and a nurse will answer the phone.    For prescription refill requests, have  your pharmacy contact our office and allow 72 hours.    Due to Covid, you will need to wear a mask upon entering the hospital. If you do not have a mask, a mask will be given to you at the Main Entrance upon arrival. For doctor visits, patients may have 1 support person age 29 or older with them. For treatment visits, patients can not have anyone with them due to social distancing guidelines and our immunocompromised population.

## 2022-10-09 LAB — KAPPA/LAMBDA LIGHT CHAINS
Kappa free light chain: 34.3 mg/L — ABNORMAL HIGH (ref 3.3–19.4)
Kappa, lambda light chain ratio: 1.9 — ABNORMAL HIGH (ref 0.26–1.65)
Lambda free light chains: 18.1 mg/L (ref 5.7–26.3)

## 2022-10-09 LAB — HAPTOGLOBIN: Haptoglobin: 223 mg/dL (ref 38–329)

## 2022-10-10 LAB — PROTEIN ELECTROPHORESIS, SERUM
A/G Ratio: 1.4 (ref 0.7–1.7)
Albumin ELP: 3.5 g/dL (ref 2.9–4.4)
Alpha-1-Globulin: 0.2 g/dL (ref 0.0–0.4)
Alpha-2-Globulin: 0.8 g/dL (ref 0.4–1.0)
Beta Globulin: 0.8 g/dL (ref 0.7–1.3)
Gamma Globulin: 0.6 g/dL (ref 0.4–1.8)
Globulin, Total: 2.5 g/dL (ref 2.2–3.9)
Total Protein ELP: 6 g/dL (ref 6.0–8.5)

## 2022-10-10 LAB — METHYLMALONIC ACID, SERUM: Methylmalonic Acid, Quantitative: 345 nmol/L (ref 0–378)

## 2022-10-12 LAB — COPPER, SERUM: Copper: 113 ug/dL (ref 69–132)

## 2022-10-29 ENCOUNTER — Inpatient Hospital Stay: Payer: PPO

## 2022-10-29 ENCOUNTER — Inpatient Hospital Stay (HOSPITAL_BASED_OUTPATIENT_CLINIC_OR_DEPARTMENT_OTHER): Payer: PPO | Admitting: Hematology

## 2022-10-29 ENCOUNTER — Encounter: Payer: Self-pay | Admitting: Hematology

## 2022-10-29 ENCOUNTER — Other Ambulatory Visit: Payer: Self-pay

## 2022-10-29 VITALS — HR 81 | Temp 98.0°F | Resp 18 | Wt 171.7 lb

## 2022-10-29 DIAGNOSIS — D539 Nutritional anemia, unspecified: Secondary | ICD-10-CM

## 2022-10-29 MED ORDER — CYANOCOBALAMIN 1000 MCG/ML IJ SOLN: 1000.0000 ug | Freq: Once | INTRAMUSCULAR | Status: DC

## 2022-10-29 MED ORDER — CYANOCOBALAMIN 1000 MCG/ML IJ SOLN
1000.0000 ug | Freq: Once | INTRAMUSCULAR | Status: AC
  Administered 2022-10-29: 1000 ug via INTRAMUSCULAR
  Filled 2022-10-29: qty 1

## 2022-10-29 NOTE — Patient Instructions (Signed)
[place] at [facility] Discharge Instructions   You were seen and examined today by Dr. Delton Coombes.  He reviewed the results of your lab work. It shows your iron is low as well as your B12. We will give you a B12 injection today. Then you need to take a '1mg'$  (1000 mcg) B12 tablet daily.  We will arrange for you to have 2 infusions of IV iron also.   Return as scheduled.    Thank you for choosing [place] at [facility] to provide your oncology and hematology care.  To afford each patient quality time with our provider, please arrive at least 15 minutes before your scheduled appointment time.   If you have a lab appointment with the [place] please come in thru the Main Entrance and check in at the main information desk.  You need to re-schedule your appointment should you arrive 10 or more minutes late.  We strive to give you quality time with our providers, and arriving late affects you and other patients whose appointments are after yours.  Also, if you no show three or more times for appointments you may be dismissed from the clinic at the providers discretion.     Again, thank you for choosing [facility].  Our hope is that these requests will decrease the amount of time that you wait before being seen by our physicians.       _____________________________________________________________  Should you have questions after your visit to [facility], please contact our office at [phone] and follow the prompts.  Our office hours are 8:00 a.m. and 4:30 p.m. Monday - Friday.  Please note that voicemails left after 4:00 p.m. may not be returned until the following business day.  We are closed weekends and major holidays.  You do have access to a nurse 24-7, just call the main number to the clinic [phone] and do not press any options, hold on the line and a nurse will answer the phone.    For prescription  refill requests, have your pharmacy contact our office and allow 72 hours.    Due to Covid, you will need to wear a mask upon entering the hospital. If you do not have a mask, a mask will be given to you at the Main Entrance upon arrival. For doctor visits, patients may have 1 support person age 31 or older with them. For treatment visits, patients can not have anyone with them due to social distancing guidelines and our immunocompromised population.

## 2022-10-29 NOTE — Patient Instructions (Signed)
[facility]  Discharge Instructions: Thank you for choosing [facility] to provide your oncology and hematology care.  If you have a lab appointment with the Cancer Center, please come in thru the Main Entrance and check in at the main information desk.  Wear comfortable clothing and clothing appropriate for easy access to any Portacath or PICC line.   We strive to give you quality time with your provider. You may need to reschedule your appointment if you arrive late (15 or more minutes).  Arriving late affects you and other patients whose appointments are after yours.  Also, if you miss three or more appointments without notifying the office, you may be dismissed from the clinic at the provider's discretion.      For prescription refill requests, have your pharmacy contact our office and allow 72 hours for refills to be completed.    Today you received a B-12 injection.    To help prevent nausea and vomiting after your treatment, we encourage you to take your nausea medication as directed.  BELOW ARE SYMPTOMS THAT SHOULD BE REPORTED IMMEDIATELY: *FEVER GREATER THAN 100.4 F (38 C) OR HIGHER *CHILLS OR SWEATING *NAUSEA AND VOMITING THAT IS NOT CONTROLLED WITH YOUR NAUSEA MEDICATION *UNUSUAL SHORTNESS OF BREATH *UNUSUAL BRUISING OR BLEEDING *URINARY PROBLEMS (pain or burning when urinating, or frequent urination) *BOWEL PROBLEMS (unusual diarrhea, constipation, pain near the anus) TENDERNESS IN MOUTH AND THROAT WITH OR WITHOUT PRESENCE OF ULCERS (sore throat, sores in mouth, or a toothache) UNUSUAL RASH, SWELLING OR PAIN  UNUSUAL VAGINAL DISCHARGE OR ITCHING   Items with * indicate a potential emergency and should be followed up as soon as possible or go to the Emergency Department if any problems should occur.  Please show the CHEMOTHERAPY ALERT CARD or IMMUNOTHERAPY ALERT CARD at check-in to the Emergency Department and triage nurse.  Should you  have questions after your visit or need to cancel or reschedule your appointment, please contact [facility] [phone]  and follow the prompts.  Office hours are 8:00 a.m. to 4:30 p.m. Monday - Friday. Please note that voicemails left after 4:00 p.m. may not be returned until the following business day.  We are closed weekends and major holidays. You have access to a nurse at all times for urgent questions. Please call the main number to the clinic [phone] and follow the prompts.  For any non-urgent questions, you may also contact your provider using MyChart. We now offer e-Visits for anyone 18 and older to request care online for non-urgent symptoms. For details visit [email or web address].   Also download the MyChart app! Go to the app store, search "MyChart", open the app, select [place], and log in with your MyChart username and password.   

## 2022-10-29 NOTE — Progress Notes (Signed)
Patient in clinic today for office visit. Physician ordered B-12 injection. Patient remained stable throughout injection. Patient discharged from clini with son ambulatory and in stable condition.

## 2022-10-29 NOTE — Progress Notes (Signed)
Troy Thompson, Troy Thompson [id number]   CLINIC:  Medical Oncology/Hematology  PCP:  Asencion Noble, MD [address] [place] [address] [phone]   REASON FOR VISIT:  Follow-up for macrocytic anemia  PRIOR THERAPY: None  CURRENT THERAPY: Feraheme and B12 supplements  INTERVAL HISTORY:  Troy Thompson 89 y.o. male for follow-up of macrocytic anemia.  He reports fatigue with energy levels around 70%.  Denies any bleeding per rectum or melena.  REVIEW OF SYSTEMS:  Review of Systems  Constitutional:  Positive for fatigue.  Neurological:  Positive for dizziness.  All other systems reviewed and are negative.    PAST MEDICAL/SURGICAL HISTORY:  Past Medical History:  Diagnosis Date   Carotid artery occlusion    Hypertension    Pneumonia    Retinal embolus, right 12/30/2007   Sudden onset of blindness    Stroke [facility])    Past Surgical History:  Procedure Laterality Date   CAROTID ENDARTERECTOMY  01/26/2008   Right    CHOLECYSTECTOMY N/A 01/17/2014   Procedure: LAPAROSCOPIC CHOLECYSTECTOMY;  Surgeon: Jamesetta So, MD;  Location: [facility];  Service: General;  Laterality: N/A;   COLONOSCOPY  03/15/2008   JOI:NOMVEH rectum/Left-sided transverse diverticula/Diminutive cecal polyp status post cold biopsy(Tubular adenoma)   COLONOSCOPY  09/24/2002   MCN:OBSJGG rectum/Left-sided diverticulum/Polyp at the splenic flexure removed    HERNIA REPAIR     JOINT REPLACEMENT Right 2005   knee   PROSTATE SURGERY     30 yrs ago   TRANSURETHRAL RESECTION OF PROSTATE       SOCIAL HISTORY:  Social History   Socioeconomic History   Marital status: Married    Spouse name: Not on file   Number of children: Not on file   Years of education: Not on file   Highest education level: Not on file  Occupational History   Not on file  Tobacco Use   Smoking status: Former    Years: 22.00    Types: Cigarettes   Smokeless tobacco: Never  Vaping Use    Vaping Use: Never used  Substance and Sexual Activity   Alcohol use: No   Drug use: No   Sexual activity: Never  Other Topics Concern   Not on file  Social History Narrative   Not on file   Social Determinants of Health   Financial Resource Strain: Not on file  Food Insecurity: No Food Insecurity (10/08/2022)   Hunger Vital Sign    Worried About Running Out of Food in the Last Year: Never true    Ran Out of Food in the Last Year: Never true  Transportation Needs: No Transportation Needs (10/08/2022)   PRAPARE - Hydrologist (Medical): No    Lack of Transportation (Non-Medical): No  Physical Activity: Not on file  Stress: Not on file  Social Connections: Not on file  Intimate Partner Violence: Not At Risk (10/08/2022)   Humiliation, Afraid, Rape, and Kick questionnaire    Fear of Current or Ex-Partner: No    Emotionally Abused: No    Physically Abused: No    Sexually Abused: No    FAMILY HISTORY:  Family History  Problem Relation Age of Onset   Colon cancer Father        died at 26   Cancer Father        Colon   Heart attack Mother    Hypertension Daughter    Diabetes Son  CURRENT MEDICATIONS:  Outpatient Encounter Medications as of 10/29/2022  Medication Sig Note   amLODipine (NORVASC) 5 MG tablet Take 5 mg by mouth daily.    aspirin 81 MG tablet Take 81 mg by mouth at bedtime.  01/24/2014: .   cloNIDine (CATAPRES) 0.1 MG tablet TAKE 1 TABLET BY MOUTH AT BEDTIME FOR SEVEN DAYS, THEN TAKE 1 TABLET TWICE DAILY    hydrALAZINE (APRESOLINE) 25 MG tablet Take 25 mg by mouth 2 (two) times daily.    hydrochlorothiazide (MICROZIDE) 12.5 MG capsule TAKE TWO CAPSULES BY MOUTH EVERY DAY - TAKE WITH LOSARTAN    losartan (COZAAR) 50 MG tablet TAKE TWO TABLETS BY MOUTH EVERY DAY -TAKE WITH HCTZ    meclizine (ANTIVERT) 25 MG tablet Take 25 mg by mouth 3 (three) times daily as needed.    Multiple Vitamins-Minerals (PRESERVISION AREDS 2 PO) Take 1 tablet by  mouth 2 (two) times daily.    omeprazole (PRILOSEC) 20 MG capsule 20 mg daily. 03/01/2014: Received Sig:    simvastatin (ZOCOR) 20 MG tablet Take 20 mg by mouth at bedtime.     valsartan-hydrochlorothiazide (DIOVAN-HCT) 320-25 MG per tablet Take 1 tablet by mouth every morning.     No facility-administered encounter medications on file as of 10/29/2022.    ALLERGIES:  No Known Allergies   PHYSICAL EXAM:  ECOG Performance status: 1  Vitals:   10/29/22 0759  Pulse: 81  Resp: 18  Temp: 98 F (36.7 C)  SpO2: 99%   Filed Weights   10/29/22 0759  Weight: 171 lb 11.8 oz (77.9 kg)   Physical Exam Vitals reviewed.  Constitutional:      Appearance: Normal appearance.  Cardiovascular:     Rate and Rhythm: Normal rate and regular rhythm.     Heart sounds: Normal heart sounds.  Pulmonary:     Effort: Pulmonary effort is normal.     Breath sounds: Normal breath sounds.  Neurological:     Mental Status: He is alert.  Psychiatric:        Mood and Affect: Mood normal.        Behavior: Behavior normal.      LABORATORY DATA:  I have reviewed the labs as listed.  CBC    Component Value Date/Time   WBC 4.0 10/08/2022 0848   RBC 2.91 (L) 10/08/2022 0848   RBC 2.92 (L) 10/08/2022 0848   HGB 9.4 (L) 10/08/2022 0848   HCT 29.7 (L) 10/08/2022 0848   PLT 110 (L) 10/08/2022 0848   MCV 101.7 (H) 10/08/2022 0848   MCH 32.2 10/08/2022 0848   MCHC 31.6 10/08/2022 0848   RDW 14.1 10/08/2022 0848   LYMPHSABS 0.8 10/08/2022 0848   MONOABS 0.5 10/08/2022 0848   EOSABS 0.1 10/08/2022 0848   BASOSABS 0.0 10/08/2022 0848      Latest Ref Rng & Units 01/27/2019    4:34 PM 01/25/2014    5:36 AM 01/24/2014   11:05 AM  CMP  Glucose 70 - 99 mg/dL  101  133   BUN 6 - 23 mg/dL  26  28   Creatinine 0.61 - 1.24 mg/dL 1.00  1.53  1.58   Sodium 137 - 147 mEq/L  134  135   Potassium 3.7 - 5.3 mEq/L  3.7  4.4   Chloride 96 - 112 mEq/L  98  97   CO2 19 - 32 mEq/L  24  25   Calcium 8.4 - 10.5  mg/dL  7.8  8.5   Total Protein  6.0 - 8.3 g/dL  5.5  6.2   Total Bilirubin 0.3 - 1.2 mg/dL  0.5  0.5   Alkaline Phos 39 - 117 U/L  59  46   AST 0 - 37 U/L  13  14   ALT 0 - 53 U/L  10  13     DIAGNOSTIC IMAGING:  I have independently reviewed the scans and discussed with the patient.  ASSESSMENT:  1.  Macrocytic anemia and mild thrombocytopenia: - Patient seen at the request of Dr. Willey Blade - CBC (09/27/2022): Hb-9.9, MCV-99, creatinine-1.24 (GFR of 54), PLT-119, WBC-4 - CBC (05/07/2022): Hb-10.5, MCV-95, PLT-137, WBC-4.4 - CBC (12/27/2021): Hb-10.8, MCV-95, PLT-143 - Colonoscopy was more than 10 years ago.  Denies any bleeding per rectum or melena.  Not on iron supplements.  No ice pica. - He has mild CKD with a GFR 50-60 ml per minute   2.  Social/family history: - He lives at home independently.  His wife passed away about a year ago.  He is a retired Cabin crew and worked on Engineer, production.  He is independent of ADLs and IADLs.  He quit smoking 60 years ago. - Father had colon cancer    PLAN:  1.  Macrocytic anemia and mild thrombocytopenia: - We have reviewed labs from 10/08/2022.  Ferritin is low at 27 and B12 low at 178. - Hemoglobin was 9.4 with MCV 101 and platelet count 110.  LDH and reticulocyte count were normal. - SPEP was negative.  Free light chain ratio is slightly elevated at 1.9, consistent with mild CKD. - We will give B12 injection today.  He will start B12 1 mg tablets daily. - We discussed starting him on Feraheme infusions x 2.  We discussed side effects in detail. - Will see him back in 2 months for follow-up with repeat labs.  Will also check immunofixation.      Orders placed this encounter:  No orders of the defined types were placed in this encounter.     Derek Jack, MD [place] [phone]

## 2022-10-30 ENCOUNTER — Inpatient Hospital Stay: Payer: PPO

## 2022-10-30 VITALS — BP 144/47 | HR 57 | Temp 98.1°F | Resp 18

## 2022-10-30 DIAGNOSIS — D539 Nutritional anemia, unspecified: Secondary | ICD-10-CM | POA: Diagnosis not present

## 2022-10-30 MED ORDER — SODIUM CHLORIDE 0.9 % IV SOLN
510.0000 mg | Freq: Once | INTRAVENOUS | Status: AC
  Administered 2022-10-30: 510 mg via INTRAVENOUS
  Filled 2022-10-30: qty 17

## 2022-10-30 MED ORDER — SODIUM CHLORIDE 0.9 % IV SOLN: Freq: Once | INTRAVENOUS | Status: AC

## 2022-10-30 NOTE — Patient Instructions (Signed)
[facility]  Discharge Instructions: Thank you for choosing [facility] to provide your oncology and hematology care.  If you have a lab appointment with the Cancer Center, please come in thru the Main Entrance and check in at the main information desk.  Wear comfortable clothing and clothing appropriate for easy access to any Portacath or PICC line.   We strive to give you quality time with your provider. You may need to reschedule your appointment if you arrive late (15 or more minutes).  Arriving late affects you and other patients whose appointments are after yours.  Also, if you miss three or more appointments without notifying the office, you may be dismissed from the clinic at the provider's discretion.      For prescription refill requests, have your pharmacy contact our office and allow 72 hours for refills to be completed.    Ferumoxytol Injection What is this medication? FERUMOXYTOL (FER ue MOX i tol) treats low levels of iron in your body (iron deficiency anemia). Iron is a mineral that plays an important role in making red blood cells, which carry oxygen from your lungs to the rest of your body. This medicine may be used for other purposes; ask your health care provider or pharmacist if you have questions. COMMON BRAND NAME(S): Feraheme What should I tell my care team before I take this medication? They need to know if you have any of these conditions: Anemia not caused by low iron levels High levels of iron in the blood Magnetic resonance imaging (MRI) test scheduled An unusual or allergic reaction to iron, other medications, foods, dyes, or preservatives Pregnant or trying to get pregnant Breastfeeding How should I use this medication? This medication is injected into a vein. It is given by your care team in a hospital or clinic setting. Talk to your care team the use of this medication in children. Special care may be needed. Overdosage:  If you think you have taken too much of this medicine contact a poison control center or emergency room at once. NOTE: This medicine is only for you. Do not share this medicine with others. What if I miss a dose? It is important not to miss your dose. Call your care team if you are unable to keep an appointment. What may interact with this medication? Other iron products This list may not describe all possible interactions. Give your health care provider a list of all the medicines, herbs, non-prescription drugs, or dietary supplements you use. Also tell them if you smoke, drink alcohol, or use illegal drugs. Some items may interact with your medicine. What should I watch for while using this medication? Visit your care team regularly. Tell your care team if your symptoms do not start to get better or if they get worse. You may need blood work done while you are taking this medication. You may need to follow a special diet. Talk to your care team. Foods that contain iron include: whole grains/cereals, dried fruits, beans, or peas, leafy green vegetables, and organ meats (liver, kidney). What side effects may I notice from receiving this medication? Side effects that you should report to your care team as soon as possible: Allergic reactions--skin rash, itching, hives, swelling of the face, lips, tongue, or throat Low blood pressure--dizziness, feeling faint or lightheaded, blurry vision Shortness of breath Side effects that usually do not require medical attention (report to your care team if they continue or are bothersome): Flushing Headache   Joint pain Muscle pain Nausea Pain, redness, or irritation at injection site This list may not describe all possible side effects. Call your doctor for medical advice about side effects. You may report side effects to FDA at 1-800-FDA-1088. Where should I keep my medication? This medication is given in a hospital or clinic and will not be stored at  home. NOTE: This sheet is a summary. It may not cover all possible information. If you have questions about this medicine, talk to your doctor, pharmacist, or health care provider.  2023 Elsevier/Gold Standard (2021-02-14 00:00:00)    To help prevent nausea and vomiting after your treatment, we encourage you to take your nausea medication as directed.  BELOW ARE SYMPTOMS THAT SHOULD BE REPORTED IMMEDIATELY: *FEVER GREATER THAN 100.4 F (38 C) OR HIGHER *CHILLS OR SWEATING *NAUSEA AND VOMITING THAT IS NOT CONTROLLED WITH YOUR NAUSEA MEDICATION *UNUSUAL SHORTNESS OF BREATH *UNUSUAL BRUISING OR BLEEDING *URINARY PROBLEMS (pain or burning when urinating, or frequent urination) *BOWEL PROBLEMS (unusual diarrhea, constipation, pain near the anus) TENDERNESS IN MOUTH AND THROAT WITH OR WITHOUT PRESENCE OF ULCERS (sore throat, sores in mouth, or a toothache) UNUSUAL RASH, SWELLING OR PAIN  UNUSUAL VAGINAL DISCHARGE OR ITCHING   Items with * indicate a potential emergency and should be followed up as soon as possible or go to the Emergency Department if any problems should occur.  Please show the CHEMOTHERAPY ALERT CARD or IMMUNOTHERAPY ALERT CARD at check-in to the Emergency Department and triage nurse.  Should you have questions after your visit or need to cancel or reschedule your appointment, please contact [facility] [phone]  and follow the prompts.  Office hours are 8:00 a.m. to 4:30 p.m. Monday - Friday. Please note that voicemails left after 4:00 p.m. may not be returned until the following business day.  We are closed weekends and major holidays. You have access to a nurse at all times for urgent questions. Please call the main number to the clinic [phone] and follow the prompts.  For any non-urgent questions, you may also contact your provider using MyChart. We now offer e-Visits for anyone 18 and older to request care online for non-urgent symptoms. For  details visit [email or web address].   Also download the MyChart app! Go to the app store, search "MyChart", open the app, select [facility], and log in with your MyChart username and password.   

## 2022-10-30 NOTE — Progress Notes (Signed)
Patient presents today for iron infusion.  Patient is in satisfactory condition with no new complaints voiced.  Vital signs are stable.  Peripheral IV placed in left AC.  IV flushed and good blood return noted.  No discomfort per the patient.  We will proceed with infusion per provider orders.   Patient tolerated iron infusion well with no complaints voiced.  Patient left ambulatory with son in stable condition.  Vital signs stable at discharge.  Follow up as scheduled.

## 2022-11-06 ENCOUNTER — Inpatient Hospital Stay: Payer: PPO

## 2022-11-06 VITALS — BP 173/51 | HR 54 | Temp 98.1°F | Resp 17

## 2022-11-06 DIAGNOSIS — D539 Nutritional anemia, unspecified: Secondary | ICD-10-CM

## 2022-11-06 MED ORDER — SODIUM CHLORIDE 0.9 % IV SOLN: Freq: Once | INTRAVENOUS | Status: AC

## 2022-11-06 MED ORDER — SODIUM CHLORIDE 0.9 % IV SOLN
510.0000 mg | Freq: Once | INTRAVENOUS | Status: AC
  Administered 2022-11-06: 510 mg via INTRAVENOUS
  Filled 2022-11-06: qty 510

## 2022-11-06 NOTE — Patient Instructions (Signed)
[facility]  Discharge Instructions: Thank you for choosing [facility] to provide your oncology and hematology care.  If you have a lab appointment with the Cancer Center, please come in thru the Main Entrance and check in at the main information desk.  Wear comfortable clothing and clothing appropriate for easy access to any Portacath or PICC line.   We strive to give you quality time with your provider. You may need to reschedule your appointment if you arrive late (15 or more minutes).  Arriving late affects you and other patients whose appointments are after yours.  Also, if you miss three or more appointments without notifying the office, you may be dismissed from the clinic at the provider's discretion.      For prescription refill requests, have your pharmacy contact our office and allow 72 hours for refills to be completed.    Ferumoxytol Injection What is this medication? FERUMOXYTOL (FER ue MOX i tol) treats low levels of iron in your body (iron deficiency anemia). Iron is a mineral that plays an important role in making red blood cells, which carry oxygen from your lungs to the rest of your body. This medicine may be used for other purposes; ask your health care provider or pharmacist if you have questions. COMMON BRAND NAME(S): Feraheme What should I tell my care team before I take this medication? They need to know if you have any of these conditions: Anemia not caused by low iron levels High levels of iron in the blood Magnetic resonance imaging (MRI) test scheduled An unusual or allergic reaction to iron, other medications, foods, dyes, or preservatives Pregnant or trying to get pregnant Breastfeeding How should I use this medication? This medication is injected into a vein. It is given by your care team in a hospital or clinic setting. Talk to your care team the use of this medication in children. Special care may be needed. Overdosage:  If you think you have taken too much of this medicine contact a poison control center or emergency room at once. NOTE: This medicine is only for you. Do not share this medicine with others. What if I miss a dose? It is important not to miss your dose. Call your care team if you are unable to keep an appointment. What may interact with this medication? Other iron products This list may not describe all possible interactions. Give your health care provider a list of all the medicines, herbs, non-prescription drugs, or dietary supplements you use. Also tell them if you smoke, drink alcohol, or use illegal drugs. Some items may interact with your medicine. What should I watch for while using this medication? Visit your care team regularly. Tell your care team if your symptoms do not start to get better or if they get worse. You may need blood work done while you are taking this medication. You may need to follow a special diet. Talk to your care team. Foods that contain iron include: whole grains/cereals, dried fruits, beans, or peas, leafy green vegetables, and organ meats (liver, kidney). What side effects may I notice from receiving this medication? Side effects that you should report to your care team as soon as possible: Allergic reactions--skin rash, itching, hives, swelling of the face, lips, tongue, or throat Low blood pressure--dizziness, feeling faint or lightheaded, blurry vision Shortness of breath Side effects that usually do not require medical attention (report to your care team if they continue or are bothersome): Flushing Headache   Joint pain Muscle pain Nausea Pain, redness, or irritation at injection site This list may not describe all possible side effects. Call your doctor for medical advice about side effects. You may report side effects to FDA at 1-800-FDA-1088. Where should I keep my medication? This medication is given in a hospital or clinic and will not be stored at  home. NOTE: This sheet is a summary. It may not cover all possible information. If you have questions about this medicine, talk to your doctor, pharmacist, or health care provider.  2023 Elsevier/Gold Standard (2021-02-14 00:00:00)    To help prevent nausea and vomiting after your treatment, we encourage you to take your nausea medication as directed.  BELOW ARE SYMPTOMS THAT SHOULD BE REPORTED IMMEDIATELY: *FEVER GREATER THAN 100.4 F (38 C) OR HIGHER *CHILLS OR SWEATING *NAUSEA AND VOMITING THAT IS NOT CONTROLLED WITH YOUR NAUSEA MEDICATION *UNUSUAL SHORTNESS OF BREATH *UNUSUAL BRUISING OR BLEEDING *URINARY PROBLEMS (pain or burning when urinating, or frequent urination) *BOWEL PROBLEMS (unusual diarrhea, constipation, pain near the anus) TENDERNESS IN MOUTH AND THROAT WITH OR WITHOUT PRESENCE OF ULCERS (sore throat, sores in mouth, or a toothache) UNUSUAL RASH, SWELLING OR PAIN  UNUSUAL VAGINAL DISCHARGE OR ITCHING   Items with * indicate a potential emergency and should be followed up as soon as possible or go to the Emergency Department if any problems should occur.  Please show the CHEMOTHERAPY ALERT CARD or IMMUNOTHERAPY ALERT CARD at check-in to the Emergency Department and triage nurse.  Should you have questions after your visit or need to cancel or reschedule your appointment, please contact [facility] [phone]  and follow the prompts.  Office hours are 8:00 a.m. to 4:30 p.m. Monday - Friday. Please note that voicemails left after 4:00 p.m. may not be returned until the following business day.  We are closed weekends and major holidays. You have access to a nurse at all times for urgent questions. Please call the main number to the clinic [phone] and follow the prompts.  For any non-urgent questions, you may also contact your provider using MyChart. We now offer e-Visits for anyone 18 and older to request care online for non-urgent symptoms. For  details visit [email or web address].   Also download the MyChart app! Go to the app store, search "MyChart", open the app, select , and log in with your MyChart username and password.   

## 2022-11-06 NOTE — Progress Notes (Signed)
Patient presents today for iron infusion.  Patient is in satisfactory condition with no new complaints voiced.  Vital signs are stable.  We will proceed with treatment per provider orders.    Patient tolerated treatment well with no complaints voiced.  Patient left ambulatory with son in stable condition.  Vital signs stable at discharge.  Follow up as scheduled.

## 2022-12-23 ENCOUNTER — Other Ambulatory Visit: Payer: PPO

## 2022-12-24 ENCOUNTER — Inpatient Hospital Stay: Payer: PPO | Attending: Hematology

## 2022-12-24 ENCOUNTER — Encounter: Payer: Self-pay | Admitting: Hematology

## 2022-12-24 DIAGNOSIS — Z87891 Personal history of nicotine dependence: Secondary | ICD-10-CM | POA: Insufficient documentation

## 2022-12-24 DIAGNOSIS — Z800 Family history of malignant neoplasm of digestive organs: Secondary | ICD-10-CM | POA: Diagnosis not present

## 2022-12-24 DIAGNOSIS — D696 Thrombocytopenia, unspecified: Secondary | ICD-10-CM | POA: Diagnosis not present

## 2022-12-24 DIAGNOSIS — D539 Nutritional anemia, unspecified: Secondary | ICD-10-CM | POA: Diagnosis not present

## 2022-12-24 DIAGNOSIS — I129 Hypertensive chronic kidney disease with stage 1 through stage 4 chronic kidney disease, or unspecified chronic kidney disease: Secondary | ICD-10-CM | POA: Diagnosis not present

## 2022-12-24 DIAGNOSIS — E538 Deficiency of other specified B group vitamins: Secondary | ICD-10-CM | POA: Diagnosis not present

## 2022-12-24 DIAGNOSIS — N182 Chronic kidney disease, stage 2 (mild): Secondary | ICD-10-CM | POA: Insufficient documentation

## 2022-12-24 LAB — CBC WITH DIFFERENTIAL/PLATELET
Abs Immature Granulocytes: 0.04 10*3/uL (ref 0.00–0.07)
Basophils Absolute: 0 10*3/uL (ref 0.0–0.1)
Basophils Relative: 0 %
Eosinophils Absolute: 0.1 10*3/uL (ref 0.0–0.5)
Eosinophils Relative: 2 %
HCT: 31.2 % — ABNORMAL LOW (ref 39.0–52.0)
Hemoglobin: 10.2 g/dL — ABNORMAL LOW (ref 13.0–17.0)
Immature Granulocytes: 1 %
Lymphocytes Relative: 25 %
Lymphs Abs: 1.2 10*3/uL (ref 0.7–4.0)
MCH: 33.8 pg (ref 26.0–34.0)
MCHC: 32.7 g/dL (ref 30.0–36.0)
MCV: 103.3 fL — ABNORMAL HIGH (ref 80.0–100.0)
Monocytes Absolute: 0.8 10*3/uL (ref 0.1–1.0)
Monocytes Relative: 16 %
Neutro Abs: 2.7 10*3/uL (ref 1.7–7.7)
Neutrophils Relative %: 56 %
Platelets: 113 10*3/uL — ABNORMAL LOW (ref 150–400)
RBC: 3.02 MIL/uL — ABNORMAL LOW (ref 4.22–5.81)
RDW: 14 % (ref 11.5–15.5)
WBC: 4.7 10*3/uL (ref 4.0–10.5)
nRBC: 0 % (ref 0.0–0.2)

## 2022-12-24 LAB — COMPREHENSIVE METABOLIC PANEL
ALT: 12 U/L (ref 0–44)
AST: 17 U/L (ref 15–41)
Albumin: 3.9 g/dL (ref 3.5–5.0)
Alkaline Phosphatase: 52 U/L (ref 38–126)
Anion gap: 8 (ref 5–15)
BUN: 40 mg/dL — ABNORMAL HIGH (ref 8–23)
CO2: 23 mmol/L (ref 22–32)
Calcium: 8.7 mg/dL — ABNORMAL LOW (ref 8.9–10.3)
Chloride: 105 mmol/L (ref 98–111)
Creatinine, Ser: 1.26 mg/dL — ABNORMAL HIGH (ref 0.61–1.24)
GFR, Estimated: 53 mL/min — ABNORMAL LOW (ref >60)
Glucose, Bld: 100 mg/dL — ABNORMAL HIGH (ref 70–99)
Potassium: 4.2 mmol/L (ref 3.5–5.1)
Sodium: 136 mmol/L (ref 135–145)
Total Bilirubin: 0.4 mg/dL (ref 0.3–1.2)
Total Protein: 6.5 g/dL (ref 6.5–8.1)

## 2022-12-24 LAB — IRON AND TIBC
Iron: 70 ug/dL (ref 45–182)
Saturation Ratios: 24 % (ref 17.9–39.5)
TIBC: 295 ug/dL (ref 250–450)
UIBC: 225 ug/dL

## 2022-12-24 LAB — FERRITIN: Ferritin: 235 ng/mL (ref 24–336)

## 2022-12-24 LAB — VITAMIN B12: Vitamin B-12: 504 pg/mL (ref 180–914)

## 2022-12-30 ENCOUNTER — Inpatient Hospital Stay (HOSPITAL_BASED_OUTPATIENT_CLINIC_OR_DEPARTMENT_OTHER): Payer: PPO | Admitting: Hematology

## 2022-12-30 VITALS — BP 171/60 | HR 70 | Temp 97.3°F | Resp 16 | Wt 173.4 lb

## 2022-12-30 DIAGNOSIS — D539 Nutritional anemia, unspecified: Secondary | ICD-10-CM

## 2022-12-30 LAB — IMMUNOFIXATION ELECTROPHORESIS
IgA: 76 mg/dL (ref 61–437)
IgG (Immunoglobin G), Serum: 629 mg/dL (ref 603–1613)
IgM (Immunoglobulin M), Srm: 36 mg/dL (ref 15–143)
Total Protein ELP: 5.9 g/dL — ABNORMAL LOW (ref 6.0–8.5)

## 2022-12-30 NOTE — Progress Notes (Signed)
[facility] [address], [place] [address]    Clinic Day:  12/30/2022  Referring physician: Asencion Noble, MD  Patient Care Team: Asencion Noble, MD as PCP - General (Internal Medicine) Gala Romney Cristopher Estimable, MD as Consulting Physician (Gastroenterology)   ASSESSMENT & PLAN:   Assessment: 1.  Macrocytic anemia and mild thrombocytopenia: - Patient seen at the request of Dr. Willey Blade - CBC (09/27/2022): Hb-9.9, MCV-99, creatinine-1.24 (GFR of 54), PLT-119, WBC-4 - CBC (05/07/2022): Hb-10.5, MCV-95, PLT-137, WBC-4.4 - CBC (12/27/2021): Hb-10.8, MCV-95, PLT-143 - Colonoscopy was more than 10 years ago.  Denies any bleeding per rectum or melena.  Not on iron supplements.  No ice pica. - He has mild CKD with a GFR 50-60 ml per minute - SPEP was negative.  FLC ratio slightly elevated at 1.9 consistent with mild CKD.   2.  Social/family history: - He lives at home independently.  His wife passed away about a year ago.  He is a retired Cabin crew and worked on Engineer, production.  He is independent of ADLs and IADLs.  He quit smoking 60 years ago. - Father had colon cancer    Plan: 1.  Macrocytic anemia and mild thrombocytopenia: - He received Feraheme on 10/30/2022 and 11/06/2022. - Labs from 12/24/2022: Ferritin 235, percent saturation 24.  Hemoglobin improved to 10.2.  Mild thrombocytopenia with platelet count 130 and stable. - Recommend starting iron tablet 3 times weekly on Mondays, Wednesdays and Fridays.  Take stool softener if constipation. - RTC 3 months for follow-up with repeat CBC, ferritin and iron panel.  2.  B12 deficiency: - B12 level improved to 504 from 178.  Continue B12 1 mg tablet daily.  Orders Placed This Encounter  Procedures   CBC with Differential    Standing Status:   Future    Standing Expiration Date:   12/30/2023   Ferritin    Standing Status:   Future    Standing Expiration Date:   12/30/2023   Iron and TIBC ([facility] ONLY)     Standing Status:   Future    Standing Expiration Date:   12/30/2023   Vitamin B12    Standing Status:   Future    Standing Expiration Date:   12/30/2023   Reticulocytes    Standing Status:   Future    Standing Expiration Date:   12/30/2023    I,Alexis Herring,acting as a scribe for Derek Jack, MD.,have documented all relevant documentation on the behalf of Derek Jack, MD,as directed by  Derek Jack, MD while in the presence of Derek Jack, MD.  I, Derek Jack MD, have reviewed the above documentation for accuracy and completeness, and I agree with the above.   Derek Jack, MD   3/25/202412:27 PM  CHIEF COMPLAINT:   Diagnosis: macrocytic anemia   Prior Therapy: none  Current Therapy:  Feraheme, B12 supplements, po iron MWF  INTERVAL HISTORY:   Gunner is a 89 y.o. male presenting to clinic today for follow up of macrocytic anemia. He was last seen by me on 10/29/22.  Today, he states that he is doing well overall. His appetite level is at 100%. His energy level is at 100%. He reports having some improvement of his energy following his last infusion. He reports compliance with B12 supplementation. He states that he has never taken iron supplements that he can recall but he is agreeable to starting one. He reports that he is able to perform all of his ADLs without much  difficulty. His only limitations are attributed to his chronic knee pain. He denies any blood in his stools or black stools.  PAST MEDICAL HISTORY:   Past Medical History: Past Medical History:  Diagnosis Date   Carotid artery occlusion    Hypertension    Pneumonia    Retinal embolus, right 12/30/2007   Sudden onset of blindness    Stroke [facility])     Surgical History: Past Surgical History:  Procedure Laterality Date   CAROTID ENDARTERECTOMY  01/26/2008   Right    CHOLECYSTECTOMY N/A 01/17/2014   Procedure: LAPAROSCOPIC CHOLECYSTECTOMY;  Surgeon: Jamesetta So,  MD;  Location: [facility];  Service: General;  Laterality: N/A;   COLONOSCOPY  03/15/2008   [id number] rectum/Left-sided transverse diverticula/Diminutive cecal polyp status post cold biopsy(Tubular adenoma)   COLONOSCOPY  09/24/2002   [id number] rectum/Left-sided diverticulum/Polyp at the splenic flexure removed    HERNIA REPAIR     JOINT REPLACEMENT Right 2005   knee   PROSTATE SURGERY     30 yrs ago   TRANSURETHRAL RESECTION OF PROSTATE      Social History: Social History   Socioeconomic History   Marital status: Married    Spouse name: Not on file   Number of children: Not on file   Years of education: Not on file   Highest education level: Not on file  Occupational History   Not on file  Tobacco Use   Smoking status: Former    Years: 22    Types: Cigarettes   Smokeless tobacco: Never  Vaping Use   Vaping Use: Never used  Substance and Sexual Activity   Alcohol use: No   Drug use: No   Sexual activity: Never  Other Topics Concern   Not on file  Social History Narrative   Not on file   Social Determinants of Health   Financial Resource Strain: Not on file  Food Insecurity: No Food Insecurity (10/08/2022)   Hunger Vital Sign    Worried About Running Out of Food in the Last Year: Never true    Ran Out of Food in the Last Year: Never true  Transportation Needs: No Transportation Needs (10/08/2022)   PRAPARE - Hydrologist (Medical): No    Lack of Transportation (Non-Medical): No  Physical Activity: Not on file  Stress: Not on file  Social Connections: Not on file  Intimate Partner Violence: Not At Risk (10/08/2022)   Humiliation, Afraid, Rape, and Kick questionnaire    Fear of Current or Ex-Partner: No    Emotionally Abused: No    Physically Abused: No    Sexually Abused: No    Family History: Family History  Problem Relation Age of Onset   Colon cancer Father        died at 79   Cancer Father        Colon   Heart attack Mother     Hypertension Daughter    Diabetes Son     Current Medications:  Current Outpatient Medications:    amLODipine (NORVASC) 5 MG tablet, Take 5 mg by mouth daily., Disp: , Rfl:    aspirin 81 MG tablet, Take 81 mg by mouth at bedtime. , Disp: , Rfl:    cloNIDine (CATAPRES) 0.1 MG tablet, TAKE 1 TABLET BY MOUTH AT BEDTIME FOR SEVEN DAYS, THEN TAKE 1 TABLET TWICE DAILY, Disp: , Rfl:    hydrALAZINE (APRESOLINE) 25 MG tablet, Take 25 mg by mouth 2 (two) times daily., Disp: ,  Rfl:    hydrochlorothiazide (MICROZIDE) 12.5 MG capsule, TAKE TWO CAPSULES BY MOUTH EVERY DAY - TAKE WITH LOSARTAN, Disp: , Rfl:    losartan (COZAAR) 50 MG tablet, TAKE TWO TABLETS BY MOUTH EVERY DAY -TAKE WITH HCTZ, Disp: , Rfl:    meclizine (ANTIVERT) 25 MG tablet, Take 25 mg by mouth 3 (three) times daily as needed., Disp: , Rfl: 2   Multiple Vitamins-Minerals (PRESERVISION AREDS 2 PO), Take 1 tablet by mouth 2 (two) times daily., Disp: , Rfl:    omeprazole (PRILOSEC) 20 MG capsule, 20 mg daily., Disp: , Rfl:    simvastatin (ZOCOR) 20 MG tablet, Take 20 mg by mouth at bedtime. , Disp: , Rfl:    valsartan-hydrochlorothiazide (DIOVAN-HCT) 320-25 MG per tablet, Take 1 tablet by mouth every morning. , Disp: , Rfl:    Allergies: No Known Allergies  REVIEW OF SYSTEMS:   Review of Systems  Constitutional:  Negative for appetite change, chills, fatigue and fever.  HENT:   Negative for lump/mass, mouth sores, nosebleeds, sore throat and trouble swallowing.   Eyes:  Negative for eye problems.  Respiratory:  Negative for cough and shortness of breath.   Cardiovascular:  Negative for chest pain, leg swelling and palpitations.  Gastrointestinal:  Negative for abdominal pain, constipation, diarrhea, nausea and vomiting.  Genitourinary:  Negative for bladder incontinence, difficulty urinating, dysuria, frequency, hematuria and nocturia.   Musculoskeletal:  Positive for arthralgias (knee pain). Negative for back pain, flank pain,  myalgias and neck pain.  Skin:  Negative for itching and rash.  Neurological:  Positive for dizziness. Negative for headaches and numbness.  Hematological:  Does not bruise/bleed easily.  Psychiatric/Behavioral:  Negative for depression, sleep disturbance and suicidal ideas. The patient is not nervous/anxious.   All other systems reviewed and are negative.    VITALS:   Blood pressure (!) 171/60, pulse 70, temperature (!) 97.3 F (36.3 C), temperature source Oral, resp. rate 16, weight 173 lb 6.4 oz (78.7 kg), SpO2 100 %.  Wt Readings from Last 3 Encounters:  12/30/22 173 lb 6.4 oz (78.7 kg)  10/29/22 171 lb 11.8 oz (77.9 kg)  10/08/22 171 lb 9.6 oz (77.8 kg)    Body mass index is 27.16 kg/m.  PHYSICAL EXAM:   Physical Exam Vitals and nursing note reviewed. Exam conducted with a chaperone present.  Constitutional:      Appearance: Normal appearance.  Cardiovascular:     Rate and Rhythm: Normal rate and regular rhythm.     Pulses: Normal pulses.     Heart sounds: Normal heart sounds.  Pulmonary:     Effort: Pulmonary effort is normal.     Breath sounds: Normal breath sounds.  Abdominal:     Palpations: Abdomen is soft. There is no hepatomegaly, splenomegaly or mass.     Tenderness: There is no abdominal tenderness.  Musculoskeletal:     Right lower leg: No edema.     Left lower leg: No edema.  Lymphadenopathy:     Cervical: No cervical adenopathy.     Right cervical: No superficial, deep or posterior cervical adenopathy.    Left cervical: No superficial, deep or posterior cervical adenopathy.     Upper Body:     Right upper body: No supraclavicular or axillary adenopathy.     Left upper body: No supraclavicular or axillary adenopathy.  Neurological:     General: No focal deficit present.     Mental Status: He is alert and oriented to person, place, and time.  Psychiatric:        Mood and Affect: Mood normal.        Behavior: Behavior normal.     LABS:       Latest Ref Rng & Units 12/24/2022    1:35 PM 10/08/2022    8:48 AM 01/12/2019    1:10 PM  CBC  WBC 4.0 - 10.5 K/uL 4.7  4.0  10.1   Hemoglobin 13.0 - 17.0 g/dL 10.2  9.4  11.8   Hematocrit 39.0 - 52.0 % 31.2  29.7  35.2   Platelets 150 - 400 K/uL 113  110  209       Latest Ref Rng & Units 12/24/2022    1:35 PM 01/27/2019    4:34 PM 01/25/2014    5:36 AM  CMP  Glucose 70 - 99 mg/dL 100   101   BUN 8 - 23 mg/dL 40   26   Creatinine 0.61 - 1.24 mg/dL 1.26  1.00  1.53   Sodium 135 - 145 mmol/L 136   134   Potassium 3.5 - 5.1 mmol/L 4.2   3.7   Chloride 98 - 111 mmol/L 105   98   CO2 22 - 32 mmol/L 23   24   Calcium 8.9 - 10.3 mg/dL 8.7   7.8   Total Protein 6.5 - 8.1 g/dL 6.5   5.5   Total Bilirubin 0.3 - 1.2 mg/dL 0.4   0.5   Alkaline Phos 38 - 126 U/L 52   59   AST 15 - 41 U/L 17   13   ALT 0 - 44 U/L 12   10      No results found for: "CEA1", "CEA" / No results found for: "CEA1", "CEA" No results found for: "PSA1" No results found for: "CAN199" No results found for: "CAN125"  Lab Results  Component Value Date   TOTALPROTELP 6.0 10/08/2022   ALBUMINELP 3.5 10/08/2022   A1GS 0.2 10/08/2022   A2GS 0.8 10/08/2022   BETS 0.8 10/08/2022   GAMS 0.6 10/08/2022   MSPIKE Not Observed 10/08/2022   SPEI Comment 10/08/2022   Lab Results  Component Value Date   TIBC 295 12/24/2022   TIBC 399 10/08/2022   FERRITIN 235 12/24/2022   FERRITIN 27 10/08/2022   IRONPCTSAT 24 12/24/2022   IRONPCTSAT 15 (L) 10/08/2022   Lab Results  Component Value Date   LDH 126 10/08/2022     STUDIES:   No results found.

## 2022-12-30 NOTE — Patient Instructions (Signed)
[place] at [facility] Discharge Instructions   You were seen and examined today by Dr. Delton Coombes.  He reviewed the results of your lab work. Your iron has improved significantly, as well as your B12.   Dr. Raliegh Ip recommends you start taking an iron pill. This is an over the counter medication (325 mg) Mondays, Wednesdays, and Fridays.   We will see you back in 3 months. We will repeat lab work prior to this visit.    Thank you for choosing [place] at [facility] to provide your oncology and hematology care.  To afford each patient quality time with our provider, please arrive at least 15 minutes before your scheduled appointment time.   If you have a lab appointment with the [facility] please come in thru the Main Entrance and check in at the main information desk.  You need to re-schedule your appointment should you arrive 10 or more minutes late.  We strive to give you quality time with our providers, and arriving late affects you and other patients whose appointments are after yours.  Also, if you no show three or more times for appointments you may be dismissed from the clinic at the providers discretion.     Again, thank you for choosing [facility].  Our hope is that these requests will decrease the amount of time that you wait before being seen by our physicians.       _____________________________________________________________  Should you have questions after your visit to [facility], please contact our office at [phone] and follow the prompts.  Our office hours are 8:00 a.m. and 4:30 p.m. Monday - Friday.  Please note that voicemails left after 4:00 p.m. may not be returned until the following business day.  We are closed weekends and major holidays.  You do have access to a nurse 24-7, just call the main number to the clinic [phone] and do not press any options, hold on the line and a nurse  will answer the phone.    For prescription refill requests, have your pharmacy contact our office and allow 72 hours.    Due to Covid, you will need to wear a mask upon entering the hospital. If you do not have a mask, a mask will be given to you at the Main Entrance upon arrival. For doctor visits, patients may have 1 support person age 47 or older with them. For treatment visits, patients can not have anyone with them due to social distancing guidelines and our immunocompromised population.

## 2023-03-10 DIAGNOSIS — L723 Sebaceous cyst: Secondary | ICD-10-CM | POA: Diagnosis not present

## 2023-03-25 ENCOUNTER — Inpatient Hospital Stay: Payer: PPO | Attending: Hematology

## 2023-03-25 DIAGNOSIS — Z79899 Other long term (current) drug therapy: Secondary | ICD-10-CM | POA: Insufficient documentation

## 2023-03-25 DIAGNOSIS — D539 Nutritional anemia, unspecified: Secondary | ICD-10-CM | POA: Diagnosis not present

## 2023-03-25 DIAGNOSIS — Z7982 Long term (current) use of aspirin: Secondary | ICD-10-CM | POA: Insufficient documentation

## 2023-03-25 DIAGNOSIS — Z8673 Personal history of transient ischemic attack (TIA), and cerebral infarction without residual deficits: Secondary | ICD-10-CM | POA: Diagnosis not present

## 2023-03-25 DIAGNOSIS — Z87891 Personal history of nicotine dependence: Secondary | ICD-10-CM | POA: Insufficient documentation

## 2023-03-25 DIAGNOSIS — Z800 Family history of malignant neoplasm of digestive organs: Secondary | ICD-10-CM | POA: Diagnosis not present

## 2023-03-25 DIAGNOSIS — E538 Deficiency of other specified B group vitamins: Secondary | ICD-10-CM | POA: Diagnosis not present

## 2023-03-25 DIAGNOSIS — D472 Monoclonal gammopathy: Secondary | ICD-10-CM | POA: Diagnosis not present

## 2023-03-25 DIAGNOSIS — I129 Hypertensive chronic kidney disease with stage 1 through stage 4 chronic kidney disease, or unspecified chronic kidney disease: Secondary | ICD-10-CM | POA: Insufficient documentation

## 2023-03-25 LAB — FERRITIN: Ferritin: 164 ng/mL (ref 24–336)

## 2023-03-25 LAB — VITAMIN B12: Vitamin B-12: 474 pg/mL (ref 180–914)

## 2023-03-25 LAB — CBC WITH DIFFERENTIAL/PLATELET
Abs Immature Granulocytes: 0.03 10*3/uL (ref 0.00–0.07)
Basophils Absolute: 0 10*3/uL (ref 0.0–0.1)
Basophils Relative: 0 %
Eosinophils Absolute: 0.1 10*3/uL (ref 0.0–0.5)
Eosinophils Relative: 1 %
HCT: 30.8 % — ABNORMAL LOW (ref 39.0–52.0)
Hemoglobin: 9.9 g/dL — ABNORMAL LOW (ref 13.0–17.0)
Immature Granulocytes: 1 %
Lymphocytes Relative: 17 %
Lymphs Abs: 1 10*3/uL (ref 0.7–4.0)
MCH: 34.1 pg — ABNORMAL HIGH (ref 26.0–34.0)
MCHC: 32.1 g/dL (ref 30.0–36.0)
MCV: 106.2 fL — ABNORMAL HIGH (ref 80.0–100.0)
Monocytes Absolute: 0.8 10*3/uL (ref 0.1–1.0)
Monocytes Relative: 13 %
Neutro Abs: 4.3 10*3/uL (ref 1.7–7.7)
Neutrophils Relative %: 68 %
Platelets: 100 10*3/uL — ABNORMAL LOW (ref 150–400)
RBC: 2.9 MIL/uL — ABNORMAL LOW (ref 4.22–5.81)
RDW: 13.4 % (ref 11.5–15.5)
WBC: 6.2 10*3/uL (ref 4.0–10.5)
nRBC: 0 % (ref 0.0–0.2)

## 2023-03-25 LAB — RETICULOCYTES
Immature Retic Fract: 22.2 % — ABNORMAL HIGH (ref 2.3–15.9)
RBC.: 2.91 MIL/uL — ABNORMAL LOW (ref 4.22–5.81)
Retic Count, Absolute: 61.7 10*3/uL (ref 19.0–186.0)
Retic Ct Pct: 2.1 % (ref 0.4–3.1)

## 2023-03-25 LAB — IRON AND TIBC
Iron: 95 ug/dL (ref 45–182)
Saturation Ratios: 32 % (ref 17.9–39.5)
TIBC: 297 ug/dL (ref 250–450)
UIBC: 202 ug/dL

## 2023-03-26 ENCOUNTER — Inpatient Hospital Stay: Payer: PPO

## 2023-04-01 ENCOUNTER — Inpatient Hospital Stay (HOSPITAL_BASED_OUTPATIENT_CLINIC_OR_DEPARTMENT_OTHER): Payer: PPO | Admitting: Hematology

## 2023-04-01 ENCOUNTER — Encounter: Payer: Self-pay | Admitting: Hematology

## 2023-04-01 VITALS — BP 178/57 | HR 94 | Temp 97.9°F | Resp 20 | Wt 165.0 lb

## 2023-04-01 DIAGNOSIS — D472 Monoclonal gammopathy: Secondary | ICD-10-CM

## 2023-04-01 DIAGNOSIS — D539 Nutritional anemia, unspecified: Secondary | ICD-10-CM

## 2023-04-01 NOTE — Progress Notes (Signed)
[facility] [address], [place] [address]    Clinic Day:  04/01/2023  Referring physician: Carylon Perches, MD  Patient Care Team: Carylon Perches, MD as PCP - General (Internal Medicine) Jena Gauss Gerrit Friends, MD as Consulting Physician (Gastroenterology)   ASSESSMENT & PLAN:   Assessment: 1.  Macrocytic anemia and mild thrombocytopenia: - Patient seen at the request of Dr. Ouida Sills - CBC (09/27/2022): Hb-9.9, MCV-99, creatinine-1.24 (GFR of 54), PLT-119, WBC-4 - CBC (05/07/2022): Hb-10.5, MCV-95, PLT-137, WBC-4.4 - CBC (12/27/2021): Hb-10.8, MCV-95, PLT-143 - Colonoscopy was more than 10 years ago.  Denies any bleeding per rectum or melena.  Not on iron supplements.  No ice pica. - Troy Thompson has mild CKD with a GFR 50-60 ml per minute - SPEP was negative.  FLC ratio slightly elevated at 1.9 consistent with mild CKD.   2.  Social/family history: - Troy Thompson lives at home independently.  His wife passed away about a year ago.  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  Troy Thompson is independent of ADLs and IADLs.  Troy Thompson quit smoking 60 years ago. - Father had colon cancer    Plan: 1.  Macrocytic anemia and mild thrombocytopenia: - Troy Thompson received Feraheme on 10/30/2022 and 11/06/2022. - Troy Thompson is taking iron tablet 3 times weekly on Mondays, Wednesday and Friday along with stool softener. - Reviewed labs from 03/25/2023: Ferritin is 164, down from 235.  Percent saturation is 32.  Hemoglobin is 9.9 with MCV 106.  Platelet count is 100. - Troy Thompson will continue iron tablet 3 times weekly. - There is also underlying mild MDS contributing to anemia and thrombocytopenia.  Due to his advanced age, we will not do any further workup at this time. - RTC 4 months for follow-up with repeat labs.   2.  B12 deficiency: - Continue B12 1 mg tablet daily.  B12 level today is 474.  3.  IgG kappa MGUS: - We reviewed results of immunofixation which showed IgG kappa monoclonal protein.  Free light chain ratio is  elevated at 1.90 with kappa light chains elevated at 34.  M spike was negative.  Will closely monitor and repeat labs at next visit.    Orders Placed This Encounter  Procedures   CBC with Differential/Platelet    Standing Status:   Future    Standing Expiration Date:   03/31/2024    Order Specific Question:   Release to patient    Answer:   Immediate   Comprehensive metabolic panel    Standing Status:   Future    Standing Expiration Date:   03/31/2024    Order Specific Question:   Release to patient    Answer:   Immediate   Protein electrophoresis, serum    Standing Status:   Future    Standing Expiration Date:   03/31/2024    Order Specific Question:   Release to patient    Answer:   Immediate   Kappa/lambda light chains    Standing Status:   Future    Standing Expiration Date:   03/31/2024   Ferritin    Standing Status:   Future    Standing Expiration Date:   03/31/2024    Order Specific Question:   Release to patient    Answer:   Immediate   Iron and TIBC    Standing Status:   Future    Standing Expiration Date:   03/31/2024    Order Specific Question:   Release to patient  Answer:   Immediate   Vitamin B12    Standing Status:   Future    Standing Expiration Date:   03/31/2024    Order Specific Question:   Release to patient    Answer:   Immediate      I,Katie Daubenspeck,acting as a scribe for Doreatha Massed, MD.,have documented all relevant documentation on the behalf of Doreatha Massed, MD,as directed by  Doreatha Massed, MD while in the presence of Doreatha Massed, MD.   I, Doreatha Massed MD, have reviewed the above documentation for accuracy and completeness, and I agree with the above.   Doreatha Massed, MD   6/25/20245:33 PM  CHIEF COMPLAINT:   Diagnosis: macrocytic anemia    Cancer Staging  No matching staging information was found for the patient.   Prior Therapy: none  Current Therapy:  Feraheme, B12 supplements, po iron  MWF    HISTORY OF PRESENT ILLNESS:   Oncology History   No history exists.     INTERVAL HISTORY:   Troy Thompson is a 89 y.o. male presenting to clinic today for follow up of macrocytic anemia. Troy Thompson was last seen by me on 12/30/22.  Today, Troy Thompson states that Troy Thompson is doing well overall. His appetite level is at 100%. His energy level is at 75%.  PAST MEDICAL HISTORY:   Past Medical History: Past Medical History:  Diagnosis Date   Carotid artery occlusion    Hypertension    Pneumonia    Retinal embolus, right 12/30/2007   Sudden onset of blindness    Stroke [facility])     Surgical History: Past Surgical History:  Procedure Laterality Date   CAROTID ENDARTERECTOMY  01/26/2008   Right    CHOLECYSTECTOMY N/A 01/17/2014   Procedure: LAPAROSCOPIC CHOLECYSTECTOMY;  Surgeon: Dalia Heading, MD;  Location: [facility];  Service: General;  Laterality: N/A;   COLONOSCOPY  03/15/2008   VZD:GLOVFI rectum/Left-sided transverse diverticula/Diminutive cecal polyp status post cold biopsy(Tubular adenoma)   COLONOSCOPY  09/24/2002   EPP:IRJJOA rectum/Left-sided diverticulum/Polyp at the splenic flexure removed    HERNIA REPAIR     JOINT REPLACEMENT Right 2005   knee   PROSTATE SURGERY     30 yrs ago   TRANSURETHRAL RESECTION OF PROSTATE      Social History: Social History   Socioeconomic History   Marital status: Married    Spouse name: Not on file   Number of children: Not on file   Years of education: Not on file   Highest education level: Not on file  Occupational History   Not on file  Tobacco Use   Smoking status: Former    Years: 22    Types: Cigarettes   Smokeless tobacco: Never  Vaping Use   Vaping Use: Never used  Substance and Sexual Activity   Alcohol use: No   Drug use: No   Sexual activity: Never  Other Topics Concern   Not on file  Social History Narrative   Not on file   Social Determinants of Health   Financial Resource Strain: Not on file  Food Insecurity: No Food  Insecurity (10/08/2022)   Hunger Vital Sign    Worried About Running Out of Food in the Last Year: Never true    Ran Out of Food in the Last Year: Never true  Transportation Needs: No Transportation Needs (10/08/2022)   PRAPARE - Administrator, Civil Service (Medical): No    Lack of Transportation (Non-Medical): No  Physical Activity: Not on file  Stress: Not on file  Social Connections: Not on file  Intimate Partner Violence: Not At Risk (10/08/2022)   Humiliation, Afraid, Rape, and Kick questionnaire    Fear of Current or Ex-Partner: No    Emotionally Abused: No    Physically Abused: No    Sexually Abused: No    Family History: Family History  Problem Relation Age of Onset   Colon cancer Father        died at 6   Cancer Father        Colon   Heart attack Mother    Hypertension Daughter    Diabetes Son     Current Medications:  Current Outpatient Medications:    amLODipine (NORVASC) 5 MG tablet, Take 5 mg by mouth daily., Disp: , Rfl:    aspirin 81 MG tablet, Take 81 mg by mouth at bedtime. , Disp: , Rfl:    cloNIDine (CATAPRES) 0.1 MG tablet, TAKE 1 TABLET BY MOUTH AT BEDTIME FOR SEVEN DAYS, THEN TAKE 1 TABLET TWICE DAILY, Disp: , Rfl:    hydrALAZINE (APRESOLINE) 25 MG tablet, Take 25 mg by mouth 2 (two) times daily., Disp: , Rfl:    hydrochlorothiazide (MICROZIDE) 12.5 MG capsule, TAKE TWO CAPSULES BY MOUTH EVERY DAY - TAKE WITH LOSARTAN, Disp: , Rfl:    losartan (COZAAR) 50 MG tablet, TAKE TWO TABLETS BY MOUTH EVERY DAY -TAKE WITH HCTZ, Disp: , Rfl:    meclizine (ANTIVERT) 25 MG tablet, Take 25 mg by mouth 3 (three) times daily as needed., Disp: , Rfl: 2   Multiple Vitamins-Minerals (PRESERVISION AREDS 2 PO), Take 1 tablet by mouth 2 (two) times daily., Disp: , Rfl:    omeprazole (PRILOSEC) 20 MG capsule, 20 mg daily., Disp: , Rfl:    simvastatin (ZOCOR) 20 MG tablet, Take 20 mg by mouth at bedtime. , Disp: , Rfl:    valsartan-hydrochlorothiazide (DIOVAN-HCT)  320-25 MG per tablet, Take 1 tablet by mouth every morning. , Disp: , Rfl:    Allergies: No Known Allergies  REVIEW OF SYSTEMS:   Review of Systems  Constitutional:  Negative for chills, fatigue and fever.  HENT:   Negative for lump/mass, mouth sores, nosebleeds, sore throat and trouble swallowing.   Eyes:  Negative for eye problems.  Respiratory:  Negative for cough and shortness of breath.   Cardiovascular:  Negative for chest pain, leg swelling and palpitations.  Gastrointestinal:  Positive for constipation. Negative for abdominal pain, diarrhea, nausea and vomiting.  Genitourinary:  Negative for bladder incontinence, difficulty urinating, dysuria, frequency, hematuria and nocturia.   Musculoskeletal:  Negative for arthralgias, back pain, flank pain, myalgias and neck pain.  Skin:  Negative for itching and rash.  Neurological:  Positive for dizziness. Negative for headaches and numbness.  Hematological:  Does not bruise/bleed easily.  Psychiatric/Behavioral:  Negative for depression, sleep disturbance and suicidal ideas. The patient is not nervous/anxious.   All other systems reviewed and are negative.    VITALS:   Blood pressure (!) 178/57, pulse 94, temperature 97.9 F (36.6 C), temperature source Oral, resp. rate 20, weight 165 lb (74.8 kg), SpO2 98 %.  Wt Readings from Last 3 Encounters:  04/01/23 165 lb (74.8 kg)  12/30/22 173 lb 6.4 oz (78.7 kg)  10/29/22 171 lb 11.8 oz (77.9 kg)    Body mass index is 25.84 kg/m.  Performance status (ECOG): 1 - Symptomatic but completely ambulatory  PHYSICAL EXAM:   Physical Exam Vitals and nursing note reviewed. Exam conducted with a chaperone  present.  Constitutional:      Appearance: Normal appearance.  Cardiovascular:     Rate and Rhythm: Normal rate and regular rhythm.     Pulses: Normal pulses.     Heart sounds: Normal heart sounds.  Pulmonary:     Effort: Pulmonary effort is normal.     Breath sounds: Normal breath  sounds.  Abdominal:     Palpations: Abdomen is soft. There is no hepatomegaly, splenomegaly or mass.     Tenderness: There is no abdominal tenderness.  Musculoskeletal:     Right lower leg: No edema.     Left lower leg: No edema.  Lymphadenopathy:     Cervical: No cervical adenopathy.     Right cervical: No superficial, deep or posterior cervical adenopathy.    Left cervical: No superficial, deep or posterior cervical adenopathy.     Upper Body:     Right upper body: No supraclavicular or axillary adenopathy.     Left upper body: No supraclavicular or axillary adenopathy.  Neurological:     General: No focal deficit present.     Mental Status: Troy Thompson is alert and oriented to person, place, and time.  Psychiatric:        Mood and Affect: Mood normal.        Behavior: Behavior normal.     LABS:      Latest Ref Rng & Units 03/25/2023    8:51 AM 12/24/2022    1:35 PM 10/08/2022    8:48 AM  CBC  WBC 4.0 - 10.5 K/uL 6.2  4.7  4.0   Hemoglobin 13.0 - 17.0 g/dL 9.9  95.6  9.4   Hematocrit 39.0 - 52.0 % 30.8  31.2  29.7   Platelets 150 - 400 K/uL 100  113  110       Latest Ref Rng & Units 12/24/2022    1:35 PM 01/27/2019    4:34 PM 01/25/2014    5:36 AM  CMP  Glucose 70 - 99 mg/dL 213   086   BUN 8 - 23 mg/dL 40   26   Creatinine 5.78 - 1.24 mg/dL 4.69  6.29  5.28   Sodium 135 - 145 mmol/L 136   134   Potassium 3.5 - 5.1 mmol/L 4.2   3.7   Chloride 98 - 111 mmol/L 105   98   CO2 22 - 32 mmol/L 23   24   Calcium 8.9 - 10.3 mg/dL 8.7   7.8   Total Protein 6.5 - 8.1 g/dL 6.5   5.5   Total Bilirubin 0.3 - 1.2 mg/dL 0.4   0.5   Alkaline Phos 38 - 126 U/L 52   59   AST 15 - 41 U/L 17   13   ALT 0 - 44 U/L 12   10      No results found for: "CEA1", "CEA" / No results found for: "CEA1", "CEA" No results found for: "PSA1" No results found for: "CAN199" No results found for: "CAN125"  Lab Results  Component Value Date   TOTALPROTELP 5.9 (L) 12/24/2022   ALBUMINELP 3.5 10/08/2022    A1GS 0.2 10/08/2022   A2GS 0.8 10/08/2022   BETS 0.8 10/08/2022   GAMS 0.6 10/08/2022   MSPIKE Not Observed 10/08/2022   SPEI Comment 10/08/2022   Lab Results  Component Value Date   TIBC 297 03/25/2023   TIBC 295 12/24/2022   TIBC 399 10/08/2022   FERRITIN 164 03/25/2023   FERRITIN 235 12/24/2022  FERRITIN 27 10/08/2022   IRONPCTSAT 32 03/25/2023   IRONPCTSAT 24 12/24/2022   IRONPCTSAT 15 (L) 10/08/2022   Lab Results  Component Value Date   LDH 126 10/08/2022     STUDIES:   No results found.

## 2023-04-01 NOTE — Patient Instructions (Addendum)
[facility]  Discharge Instructions  You were seen and examined today by Dr. Ellin Saba.  Dr. Ellin Saba discussed your most recent lab work which revealed that you have a pre-cancer called MGUS.   Continue taking the iron three times weekly and the Vitamin B 12 daily. Dr. Ellin Saba will keep a check on your labs as this is how we monitor MGUS.   Follow-up as scheduled in 4 months.    Thank you for choosing [facility] to provide your oncology and hematology care.   To afford each patient quality time with our provider, please arrive at least 15 minutes before your scheduled appointment time. You may need to reschedule your appointment if you arrive late (10 or more minutes). Arriving late affects you and other patients whose appointments are after yours.  Also, if you miss three or more appointments without notifying the office, you may be dismissed from the clinic at the provider's discretion.    Again, thank you for choosing [facility].  Our hope is that these requests will decrease the amount of time that you wait before being seen by our physicians.   If you have a lab appointment with the Cancer Center - please note that after April 8th, all labs will be drawn in the cancer center.  You do not have to check in or register with the main entrance as you have in the past but will complete your check-in at the cancer center.            _____________________________________________________________  Should you have questions after your visit to [facility], please contact our office at [phone] and follow the prompts.  Our office hours are 8:00 a.m. to 4:30 p.m. Monday - Thursday and 8:00 a.m. to 2:30 p.m. Friday.  Please note that voicemails left after 4:00 p.m. may not be returned until the following business day.  We are closed weekends and all major holidays.  You do have access to a nurse 24-7, just  call the main number to the clinic [phone] and do not press any options, hold on the line and a nurse will answer the phone.    For prescription refill requests, have your pharmacy contact our office and allow 72 hours.    Masks are no longer required in the cancer centers. If you would like for your care team to wear a mask while they are taking care of you, please let them know. You may have one support person who is at least 87 years old accompany you for your appointments.

## 2023-04-02 ENCOUNTER — Ambulatory Visit: Payer: PPO | Admitting: Hematology

## 2023-05-07 ENCOUNTER — Other Ambulatory Visit (HOSPITAL_COMMUNITY): Payer: Self-pay | Admitting: Internal Medicine

## 2023-05-07 DIAGNOSIS — R413 Other amnesia: Secondary | ICD-10-CM | POA: Diagnosis not present

## 2023-05-27 ENCOUNTER — Ambulatory Visit (HOSPITAL_COMMUNITY)
Admission: RE | Admit: 2023-05-27 | Discharge: 2023-05-27 | Disposition: A | Discharge status: Home / Self Care | Payer: PPO | Source: Ambulatory Visit | Attending: Internal Medicine | Admitting: Internal Medicine

## 2023-05-27 DIAGNOSIS — R413 Other amnesia: Secondary | ICD-10-CM | POA: Insufficient documentation

## 2023-05-27 DIAGNOSIS — I6782 Cerebral ischemia: Secondary | ICD-10-CM | POA: Diagnosis not present

## 2023-05-27 DIAGNOSIS — R9089 Other abnormal findings on diagnostic imaging of central nervous system: Secondary | ICD-10-CM | POA: Diagnosis not present

## 2023-05-27 DIAGNOSIS — I63522 Cerebral infarction due to unspecified occlusion or stenosis of left anterior cerebral artery: Secondary | ICD-10-CM | POA: Diagnosis not present

## 2023-05-27 DIAGNOSIS — G319 Degenerative disease of nervous system, unspecified: Secondary | ICD-10-CM | POA: Diagnosis not present

## 2023-07-22 ENCOUNTER — Inpatient Hospital Stay: Payer: PPO | Attending: Hematology

## 2023-07-22 DIAGNOSIS — E611 Iron deficiency: Secondary | ICD-10-CM | POA: Diagnosis not present

## 2023-07-22 DIAGNOSIS — D696 Thrombocytopenia, unspecified: Secondary | ICD-10-CM | POA: Diagnosis not present

## 2023-07-22 DIAGNOSIS — Z87891 Personal history of nicotine dependence: Secondary | ICD-10-CM | POA: Insufficient documentation

## 2023-07-22 DIAGNOSIS — E538 Deficiency of other specified B group vitamins: Secondary | ICD-10-CM | POA: Diagnosis not present

## 2023-07-22 DIAGNOSIS — N1831 Chronic kidney disease, stage 3a: Secondary | ICD-10-CM | POA: Diagnosis not present

## 2023-07-22 DIAGNOSIS — D539 Nutritional anemia, unspecified: Secondary | ICD-10-CM | POA: Diagnosis not present

## 2023-07-22 DIAGNOSIS — D472 Monoclonal gammopathy: Secondary | ICD-10-CM | POA: Insufficient documentation

## 2023-07-22 DIAGNOSIS — Z800 Family history of malignant neoplasm of digestive organs: Secondary | ICD-10-CM | POA: Insufficient documentation

## 2023-07-22 LAB — CBC WITH DIFFERENTIAL/PLATELET
Abs Immature Granulocytes: 0.02 10*3/uL (ref 0.00–0.07)
Basophils Absolute: 0 10*3/uL (ref 0.0–0.1)
Basophils Relative: 0 %
Eosinophils Absolute: 0.1 10*3/uL (ref 0.0–0.5)
Eosinophils Relative: 2 %
HCT: 31.5 % — ABNORMAL LOW (ref 39.0–52.0)
Hemoglobin: 10 g/dL — ABNORMAL LOW (ref 13.0–17.0)
Immature Granulocytes: 1 %
Lymphocytes Relative: 26 %
Lymphs Abs: 1.1 10*3/uL (ref 0.7–4.0)
MCH: 33.3 pg (ref 26.0–34.0)
MCHC: 31.7 g/dL (ref 30.0–36.0)
MCV: 105 fL — ABNORMAL HIGH (ref 80.0–100.0)
Monocytes Absolute: 0.5 10*3/uL (ref 0.1–1.0)
Monocytes Relative: 12 %
Neutro Abs: 2.6 10*3/uL (ref 1.7–7.7)
Neutrophils Relative %: 59 %
Platelets: 109 10*3/uL — ABNORMAL LOW (ref 150–400)
RBC: 3 MIL/uL — ABNORMAL LOW (ref 4.22–5.81)
RDW: 13.6 % (ref 11.5–15.5)
WBC: 4.3 10*3/uL (ref 4.0–10.5)
nRBC: 0 % (ref 0.0–0.2)

## 2023-07-22 LAB — COMPREHENSIVE METABOLIC PANEL
ALT: 11 U/L (ref 0–44)
AST: 15 U/L (ref 15–41)
Albumin: 4 g/dL (ref 3.5–5.0)
Alkaline Phosphatase: 48 U/L (ref 38–126)
Anion gap: 8 (ref 5–15)
BUN: 36 mg/dL — ABNORMAL HIGH (ref 8–23)
CO2: 23 mmol/L (ref 22–32)
Calcium: 8.3 mg/dL — ABNORMAL LOW (ref 8.9–10.3)
Chloride: 106 mmol/L (ref 98–111)
Creatinine, Ser: 1.17 mg/dL (ref 0.61–1.24)
GFR, Estimated: 57 mL/min — ABNORMAL LOW (ref >60)
Glucose, Bld: 124 mg/dL — ABNORMAL HIGH (ref 70–99)
Potassium: 3.5 mmol/L (ref 3.5–5.1)
Sodium: 137 mmol/L (ref 135–145)
Total Bilirubin: 0.5 mg/dL (ref 0.3–1.2)
Total Protein: 6.5 g/dL (ref 6.5–8.1)

## 2023-07-22 LAB — IRON AND TIBC
Iron: 100 ug/dL (ref 45–182)
Saturation Ratios: 32 % (ref 17.9–39.5)
TIBC: 315 ug/dL (ref 250–450)
UIBC: 215 ug/dL

## 2023-07-22 LAB — VITAMIN B12: Vitamin B-12: 688 pg/mL (ref 180–914)

## 2023-07-22 LAB — FERRITIN: Ferritin: 184 ng/mL (ref 24–336)

## 2023-07-23 LAB — KAPPA/LAMBDA LIGHT CHAINS
Kappa free light chain: 30.7 mg/L — ABNORMAL HIGH (ref 3.3–19.4)
Kappa, lambda light chain ratio: 1.44 (ref 0.26–1.65)
Lambda free light chains: 21.3 mg/L (ref 5.7–26.3)

## 2023-07-25 LAB — PROTEIN ELECTROPHORESIS, SERUM
A/G Ratio: 1.5 (ref 0.7–1.7)
Albumin ELP: 3.8 g/dL (ref 2.9–4.4)
Alpha-1-Globulin: 0.3 g/dL (ref 0.0–0.4)
Alpha-2-Globulin: 0.8 g/dL (ref 0.4–1.0)
Beta Globulin: 0.8 g/dL (ref 0.7–1.3)
Gamma Globulin: 0.6 g/dL (ref 0.4–1.8)
Globulin, Total: 2.5 g/dL (ref 2.2–3.9)
Total Protein ELP: 6.3 g/dL (ref 6.0–8.5)

## 2023-07-28 NOTE — Progress Notes (Unsigned)
[facility] [address], [place] [address]   CLINIC:  Medical Oncology/Hematology  PCP:  Carylon Perches, MD [address] [place] [address] [phone]   REASON FOR VISIT:  Follow-up for macrocytic anemia, mild thrombocytopenia, and IgG kappa MGUS  CURRENT THERAPY: Surveillance  INTERVAL HISTORY:   Troy Thompson 89 y.o. male returns for routine follow-up of macrocytic anemia, mild thrombocytopenia, and IgG kappa MGUS.  He was last seen by Dr. Ellin Saba on 04/01/2023.  At today's visit, he reports feeling ***.  No recent hospitalizations, surgeries, or changes in baseline health status.  *** Bleeding *** Bruising *** Petechial rash *** B symptoms  *** Fatigue or pica *** Abnormal headaches, LH, syncope  *** New onset bone pain or neurologic changes  *** Iron tablet 3 times weekly, vitamin B12 1 mg daily  He has ***% energy and ***% appetite. He endorses that he is maintaining a stable weight.  ASSESSMENT & PLAN:  1.  Macrocytic anemia and mild thrombocytopenia - Patient seen at the request of Dr. Ouida Sills CBC (09/27/2022): Hb-9.9, MCV-99, creatinine-1.24 (GFR of 54), PLT-119, WBC-4 CBC (05/07/2022): Hb-10.5, MCV-95, PLT-137, WBC-4.4 CBC (12/27/2021): Hb-10.8, MCV-95, PLT-143 - Mild CKD stage IIIa with GFR 50-60. - Mild iron deficiency and B12 deficiency addressed below.  Normal folate and copper. - Evidence of minimal MGUS addressed below, unlikely to be contributing to his anemia/thrombocytopenia. - No evidence of hemolysis with normal reticulocytes and haptoglobin. - Most recent CT abdomen/pelvis from 2015 showed normal-appearing spleen. - No significant improvement in hemoglobin or platelets despite B12 and iron supplementation.  *** - Most recent labs (07/22/2023): Hgb 10.0/MCV 105.0, platelets 109.  Normal WBC/differential.  Creatinine 1.17/GFR 57. - PLAN: Most recent labs are stable.  *** Suspect underlying mild MDS contributing to  anemia and thrombocytopenia.  Due to advanced age, we will defer any further workup at this time. - RTC in 4 months with repeat labs.  *** - Consider starting ESA therapy if Hgb consistently <10.0 despite adequate iron and B12  2.  Iron deficiency - Labs from 10/08/2022 showed ferritin 27, iron saturation 15%. - He received Feraheme on 10/30/2022 and 11/06/2022.  Minimal improvement in Hgb following IV iron. - Colonoscopy was more than 10 years ago. - *** Denies any bleeding per rectum or melena.  No ice pica.  - He is taking iron tablet 3 times weekly on Mondays, Wednesday and Friday along with stool softener.*** - Most recent labs (07/22/2023): Ferritin 184, iron saturation 32% - PLAN: No indication for IV iron at this time - Continue iron tablet 3 times weekly ***  3.  Vitamin B12 deficiency - Labs from 10/08/2022 showed vitamin B12 deficiency at 178, with high-normal MMA. - He is taking vitamin B12 1 mg daily - Most recent labs (07/22/2023): Vitamin B12 normalized at 688 - PLAN: Continue vitamin B12 1000 mcg daily.  4.  IgG kappa MGUS - Workup of anemia and thrombocytopenia revealed IgG kappa monoclonal protein per immunofixation from 12/24/2022 - Labs have shown minimally elevated FLC ratio consistent with mild CKD. - Most recent labs (07/22/2023): SPEP negative for M spike.  Mildly elevated kappa free light chain 30.7, normal lambda, normal FLC ratio 1.44. - No new onset bone pains, B symptoms, or neurologic changes.  *** - PLAN: Obtain 24-hour urine study. - Repeat MGUS/myeloma panel in approximately 6 months (April 2025)  5.  Social/family history: - He lives at home independently.  His wife passed away about a year  ago.  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  He is independent of ADLs and IADLs.  He quit smoking 60 years ago. - Father had colon cancer  PLAN SUMMARY: >> *** >> *** >> ***   Troy Thompson at [facility] **VISIT SUMMARY &  IMPORTANT INSTRUCTIONS **   You were seen today by Rojelio Brenner PA-C for your ***.    *** ***  *** ***  LABS: Return in ***   OTHER TESTS: ***  MEDICATIONS: ***  FOLLOW-UP APPOINTMENT: ***     REVIEW OF SYSTEMS: ***  Review of Systems - Oncology   PHYSICAL EXAM:  ECOG PERFORMANCE STATUS: {CHL ONC ECOG ZO:[id number]} *** There were no vitals filed for this visit. There were no vitals filed for this visit. Physical Exam  PAST MEDICAL/SURGICAL HISTORY:  Past Medical History:  Diagnosis Date   Carotid artery occlusion    Hypertension    Pneumonia    Retinal embolus, right 12/30/2007   Sudden onset of blindness    Stroke [facility])    Past Surgical History:  Procedure Laterality Date   CAROTID ENDARTERECTOMY  01/26/2008   Right    CHOLECYSTECTOMY N/A 01/17/2014   Procedure: LAPAROSCOPIC CHOLECYSTECTOMY;  Surgeon: Dalia Heading, MD;  Location: [facility];  Service: General;  Laterality: N/A;   COLONOSCOPY  03/15/2008   WJX:BJYNWG rectum/Left-sided transverse diverticula/Diminutive cecal polyp status post cold biopsy(Tubular adenoma)   COLONOSCOPY  09/24/2002   NFA:OZHYQM rectum/Left-sided diverticulum/Polyp at the splenic flexure removed    HERNIA REPAIR     JOINT REPLACEMENT Right 2005   knee   PROSTATE SURGERY     30 yrs ago   TRANSURETHRAL RESECTION OF PROSTATE      SOCIAL HISTORY:  Social History   Socioeconomic History   Marital status: Widowed    Spouse name: Not on file   Number of children: Not on file   Years of education: Not on file   Highest education level: Not on file  Occupational History   Not on file  Tobacco Use   Smoking status: Former    Types: Cigarettes   Smokeless tobacco: Never  Vaping Use   Vaping status: Never Used  Substance and Sexual Activity   Alcohol use: No   Drug use: No   Sexual activity: Never  Other Topics Concern   Not on file  Social History Narrative   Not on file   Social Determinants of Health    Financial Resource Strain: Not on file  Food Insecurity: No Food Insecurity (10/08/2022)   Hunger Vital Sign    Worried About Running Out of Food in the Last Year: Never true    Ran Out of Food in the Last Year: Never true  Transportation Needs: No Transportation Needs (10/08/2022)   PRAPARE - Administrator, Civil Service (Medical): No    Lack of Transportation (Non-Medical): No  Physical Activity: Not on file  Stress: Not on file  Social Connections: Not on file  Intimate Partner Violence: Not At Risk (10/08/2022)   Humiliation, Afraid, Rape, and Kick questionnaire    Fear of Current or Ex-Partner: No    Emotionally Abused: No    Physically Abused: No    Sexually Abused: No    FAMILY HISTORY:  Family History  Problem Relation Age of Onset   Colon cancer Father        died at 13   Cancer Father  Colon   Heart attack Mother    Hypertension Daughter    Diabetes Son     CURRENT MEDICATIONS:  Outpatient Encounter Medications as of 07/29/2023  Medication Sig Note   amLODipine (NORVASC) 5 MG tablet Take 5 mg by mouth daily.    aspirin 81 MG tablet Take 81 mg by mouth at bedtime.  01/24/2014: .   cloNIDine (CATAPRES) 0.1 MG tablet TAKE 1 TABLET BY MOUTH AT BEDTIME FOR SEVEN DAYS, THEN TAKE 1 TABLET TWICE DAILY    hydrALAZINE (APRESOLINE) 25 MG tablet Take 25 mg by mouth 2 (two) times daily.    hydrochlorothiazide (MICROZIDE) 12.5 MG capsule TAKE TWO CAPSULES BY MOUTH EVERY DAY - TAKE WITH LOSARTAN    losartan (COZAAR) 50 MG tablet TAKE TWO TABLETS BY MOUTH EVERY DAY -TAKE WITH HCTZ    meclizine (ANTIVERT) 25 MG tablet Take 25 mg by mouth 3 (three) times daily as needed.    Multiple Vitamins-Minerals (PRESERVISION AREDS 2 PO) Take 1 tablet by mouth 2 (two) times daily.    omeprazole (PRILOSEC) 20 MG capsule 20 mg daily. 03/01/2014: Received Sig:    simvastatin (ZOCOR) 20 MG tablet Take 20 mg by mouth at bedtime.     valsartan-hydrochlorothiazide (DIOVAN-HCT) 320-25  MG per tablet Take 1 tablet by mouth every morning.     No facility-administered encounter medications on file as of 07/29/2023.    ALLERGIES:  No Known Allergies  LABORATORY DATA:  I have reviewed the labs as listed.  CBC    Component Value Date/Time   WBC 4.3 07/22/2023 0845   RBC 3.00 (L) 07/22/2023 0845   HGB 10.0 (L) 07/22/2023 0845   HCT 31.5 (L) 07/22/2023 0845   PLT 109 (L) 07/22/2023 0845   MCV 105.0 (H) 07/22/2023 0845   MCH 33.3 07/22/2023 0845   MCHC 31.7 07/22/2023 0845   RDW 13.6 07/22/2023 0845   LYMPHSABS 1.1 07/22/2023 0845   MONOABS 0.5 07/22/2023 0845   EOSABS 0.1 07/22/2023 0845   BASOSABS 0.0 07/22/2023 0845      Latest Ref Rng & Units 07/22/2023    8:45 AM 12/24/2022    1:35 PM 01/27/2019    4:34 PM  CMP  Glucose 70 - 99 mg/dL 960  454    BUN 8 - 23 mg/dL 36  40    Creatinine 0.98 - 1.24 mg/dL 1.19  1.47  8.29   Sodium 135 - 145 mmol/L 137  136    Potassium 3.5 - 5.1 mmol/L 3.5  4.2    Chloride 98 - 111 mmol/L 106  105    CO2 22 - 32 mmol/L 23  23    Calcium 8.9 - 10.3 mg/dL 8.3  8.7    Total Protein 6.5 - 8.1 g/dL 6.5  6.5    Total Bilirubin 0.3 - 1.2 mg/dL 0.5  0.4    Alkaline Phos 38 - 126 U/L 48  52    AST 15 - 41 U/L 15  17    ALT 0 - 44 U/L 11  12      DIAGNOSTIC IMAGING:  I have independently reviewed the relevant imaging and discussed with the patient.   WRAP UP:  All questions were answered. The patient knows to call the clinic with any problems, questions or concerns.  Medical decision making: ***  Time spent on visit: I spent *** minutes counseling the patient face to face. The total time spent in the appointment was *** minutes and more than 50% was on  counseling.  Carnella Guadalajara, PA-C  ***

## 2023-07-29 ENCOUNTER — Inpatient Hospital Stay: Payer: PPO | Admitting: Physician Assistant

## 2023-07-29 ENCOUNTER — Encounter: Payer: Self-pay | Admitting: Physician Assistant

## 2023-07-29 VITALS — BP 164/47 | HR 78 | Temp 97.6°F | Resp 20 | Wt 160.1 lb

## 2023-07-29 DIAGNOSIS — Z23 Encounter for immunization: Secondary | ICD-10-CM | POA: Diagnosis not present

## 2023-07-29 DIAGNOSIS — D539 Nutritional anemia, unspecified: Secondary | ICD-10-CM | POA: Diagnosis not present

## 2023-07-29 DIAGNOSIS — E611 Iron deficiency: Secondary | ICD-10-CM

## 2023-07-29 DIAGNOSIS — D472 Monoclonal gammopathy: Secondary | ICD-10-CM | POA: Diagnosis not present

## 2023-07-29 DIAGNOSIS — E538 Deficiency of other specified B group vitamins: Secondary | ICD-10-CM

## 2023-07-29 NOTE — Patient Instructions (Signed)
[facility] at [facility] **VISIT SUMMARY & IMPORTANT INSTRUCTIONS **   You were seen today by Rojelio Brenner PA-C for your follow-up visit.    ANEMIA & LOW PLATELETS Your low red blood cells ("hemoglobin") and low platelets are most likely caused by a bone marrow/blood condition called "myelodysplastic syndrome" (also known as MDS). MDS is a low-grade blood cancer that is more commonly seen in older adults.  It does not spread to other parts of your body, but impairs your body's ability to make healthy blood cells. At this time, you do not need any further testing (such as bone marrow biopsy) or any treatment. Other factors that could be contributing to your anemia include mild age-related chronic kidney disease, iron deficiency, and B12 deficiency. We will continue to watch your blood cells closely with repeat labs and follow-up visit in 4 months.  IRON DEFICIENCY Your iron levels look great! Since your iron tablet is causing constipation, you can STOP taking your iron tablet at this time. Will recheck your iron levels at your follow-up visit.  VITAMIN B12 DEFICIENCY Continue taking vitamin B12 1000 mcg daily.  MGUS ("monoclonal gammopathy of undetermined significance") As we discussed, this is a "precancerous" condition where you have slightly increased plasma cells making a slightly increased amount of abnormal immunoglobulin proteins. Although MGUS is not causing any current problems in your body, it has a 1% each year risk of progression to multiple myeloma cancer. Please see the attached handout for further information regarding MGUS. We will check 24-hour urine to see if you have abnormal protein in your urine.  Urine collection kit has been provided for you. FIRST MORNING: Discard first urine of the morning into the toilet. Collect the rest of your urine in the orange jug for the next 24 hours. SECOND MORNING: Collect your first urine of the morning in  the jug.  This ends your 24-hour urine collection. **Store the urine jug in the refrigerator but is not being used. Return urine jug to fourth floor front desk as soon as it is completed.   FOLLOW-UP APPOINTMENT: Labs and office visit in 4 months  ** Thank you for trusting me with your healthcare!  I strive to provide all of my patients with quality care at each visit.  If you receive a survey for this visit, I would be so grateful to you for taking the time to provide feedback.  Thank you in advance!  ~ Kazi Montoro                   Dr. Doreatha Massed   &   Rojelio Brenner, PA-C   - - - - - - - - - - - - - - - - - -    Thank you for choosing [facility] at [facility] to provide your oncology and hematology care.  To afford each patient quality time with our provider, please arrive at least 15 minutes before your scheduled appointment time.   If you have a lab appointment with the Cancer Center please come in thru the Main Entrance and check in at the main information desk.  You need to re-schedule your appointment should you arrive 10 or more minutes late.  We strive to give you quality time with our providers, and arriving late affects you and other patients whose appointments are after yours.  Also, if you no show three or more times for appointments you may be dismissed from the  clinic at the providers discretion.     Again, thank you for choosing [facility].  Our hope is that these requests will decrease the amount of time that you wait before being seen by our physicians.       _____________________________________________________________  Should you have questions after your visit to [facility], please contact our office at [phone] and follow the prompts.  Our office hours are 8:00 a.m. and 4:30 p.m. Monday - Friday.  Please note that voicemails left after 4:00 p.m. may not be returned until the following business day.  We are  closed weekends and major holidays.  You do have access to a nurse 24-7, just call the main number to the clinic [phone] and do not press any options, hold on the line and a nurse will answer the phone.    For prescription refill requests, have your pharmacy contact our office and allow 72 hours.

## 2023-07-31 ENCOUNTER — Other Ambulatory Visit: Payer: Self-pay | Admitting: *Deleted

## 2023-07-31 DIAGNOSIS — D472 Monoclonal gammopathy: Secondary | ICD-10-CM

## 2023-07-31 DIAGNOSIS — D539 Nutritional anemia, unspecified: Secondary | ICD-10-CM | POA: Diagnosis not present

## 2023-08-03 DIAGNOSIS — D539 Nutritional anemia, unspecified: Secondary | ICD-10-CM | POA: Diagnosis not present

## 2023-08-04 LAB — UIFE/LIGHT CHAINS/TP QN, 24-HR UR
FR KAPPA LT CH,24HR: 43.15 mg/(24.h)
FR LAMBDA LT CH,24HR: 8.51 mg/(24.h)
Free Kappa Lt Chains,Ur: 39.23 mg/L (ref 1.17–86.46)
Free Kappa/Lambda Ratio: 5.07 (ref 1.83–14.26)
Free Lambda Lt Chains,Ur: 7.74 mg/L (ref 0.27–15.21)
Total Protein, Urine-Ur/day: 95 mg/(24.h) (ref 30–150)
Total Protein, Urine: 8.6 mg/dL
Total Volume: 1100

## 2023-08-20 DIAGNOSIS — D696 Thrombocytopenia, unspecified: Secondary | ICD-10-CM | POA: Diagnosis not present

## 2023-08-20 DIAGNOSIS — N183 Chronic kidney disease, stage 3 unspecified: Secondary | ICD-10-CM | POA: Diagnosis not present

## 2023-08-20 DIAGNOSIS — F0390 Unspecified dementia without behavioral disturbance: Secondary | ICD-10-CM | POA: Diagnosis not present

## 2023-08-20 DIAGNOSIS — K219 Gastro-esophageal reflux disease without esophagitis: Secondary | ICD-10-CM | POA: Diagnosis not present

## 2023-08-20 DIAGNOSIS — K5900 Constipation, unspecified: Secondary | ICD-10-CM | POA: Diagnosis not present

## 2023-08-20 DIAGNOSIS — I700 Atherosclerosis of aorta: Secondary | ICD-10-CM | POA: Diagnosis not present

## 2023-08-20 DIAGNOSIS — D519 Vitamin B12 deficiency anemia, unspecified: Secondary | ICD-10-CM | POA: Diagnosis not present

## 2023-08-20 DIAGNOSIS — Z8673 Personal history of transient ischemic attack (TIA), and cerebral infarction without residual deficits: Secondary | ICD-10-CM | POA: Diagnosis not present

## 2023-08-20 DIAGNOSIS — E782 Mixed hyperlipidemia: Secondary | ICD-10-CM | POA: Diagnosis not present

## 2023-08-20 DIAGNOSIS — I119 Hypertensive heart disease without heart failure: Secondary | ICD-10-CM | POA: Diagnosis not present

## 2023-08-20 DIAGNOSIS — H54414A Blindness right eye category 4, normal vision left eye: Secondary | ICD-10-CM | POA: Diagnosis not present

## 2023-08-20 DIAGNOSIS — N400 Enlarged prostate without lower urinary tract symptoms: Secondary | ICD-10-CM | POA: Diagnosis not present

## 2023-08-29 DIAGNOSIS — E039 Hypothyroidism, unspecified: Secondary | ICD-10-CM | POA: Diagnosis not present

## 2023-08-29 DIAGNOSIS — E559 Vitamin D deficiency, unspecified: Secondary | ICD-10-CM | POA: Diagnosis not present

## 2023-08-29 DIAGNOSIS — I10 Essential (primary) hypertension: Secondary | ICD-10-CM | POA: Diagnosis not present

## 2023-08-29 DIAGNOSIS — E782 Mixed hyperlipidemia: Secondary | ICD-10-CM | POA: Diagnosis not present

## 2023-09-01 DIAGNOSIS — I119 Hypertensive heart disease without heart failure: Secondary | ICD-10-CM | POA: Diagnosis not present

## 2023-09-01 DIAGNOSIS — E559 Vitamin D deficiency, unspecified: Secondary | ICD-10-CM | POA: Diagnosis not present

## 2023-09-08 DIAGNOSIS — K5900 Constipation, unspecified: Secondary | ICD-10-CM | POA: Diagnosis not present

## 2023-09-08 DIAGNOSIS — I959 Hypotension, unspecified: Secondary | ICD-10-CM | POA: Diagnosis not present

## 2023-09-15 DIAGNOSIS — E782 Mixed hyperlipidemia: Secondary | ICD-10-CM | POA: Diagnosis not present

## 2023-09-15 DIAGNOSIS — K219 Gastro-esophageal reflux disease without esophagitis: Secondary | ICD-10-CM | POA: Diagnosis not present

## 2023-09-15 DIAGNOSIS — I119 Hypertensive heart disease without heart failure: Secondary | ICD-10-CM | POA: Diagnosis not present

## 2023-10-03 DIAGNOSIS — K219 Gastro-esophageal reflux disease without esophagitis: Secondary | ICD-10-CM | POA: Diagnosis not present

## 2023-10-03 DIAGNOSIS — D519 Vitamin B12 deficiency anemia, unspecified: Secondary | ICD-10-CM | POA: Diagnosis not present

## 2023-10-03 DIAGNOSIS — I10 Essential (primary) hypertension: Secondary | ICD-10-CM | POA: Diagnosis not present

## 2023-10-03 DIAGNOSIS — E782 Mixed hyperlipidemia: Secondary | ICD-10-CM | POA: Diagnosis not present

## 2023-10-10 DIAGNOSIS — R350 Frequency of micturition: Secondary | ICD-10-CM | POA: Diagnosis not present

## 2023-10-10 DIAGNOSIS — N183 Chronic kidney disease, stage 3 unspecified: Secondary | ICD-10-CM | POA: Diagnosis not present

## 2023-10-10 DIAGNOSIS — M545 Low back pain, unspecified: Secondary | ICD-10-CM | POA: Diagnosis not present

## 2023-10-10 DIAGNOSIS — R82998 Other abnormal findings in urine: Secondary | ICD-10-CM | POA: Diagnosis not present

## 2023-10-13 DIAGNOSIS — I119 Hypertensive heart disease without heart failure: Secondary | ICD-10-CM | POA: Diagnosis not present

## 2023-10-13 DIAGNOSIS — M6281 Muscle weakness (generalized): Secondary | ICD-10-CM | POA: Diagnosis not present

## 2023-10-13 DIAGNOSIS — N400 Enlarged prostate without lower urinary tract symptoms: Secondary | ICD-10-CM | POA: Diagnosis not present

## 2023-10-15 ENCOUNTER — Emergency Department (HOSPITAL_COMMUNITY): Payer: PPO

## 2023-10-15 ENCOUNTER — Emergency Department (HOSPITAL_COMMUNITY)
Admission: EM | Admit: 2023-10-15 | Discharge: 2023-10-15 | Disposition: A | Discharge status: Home / Self Care | Payer: PPO | Attending: Emergency Medicine | Admitting: Emergency Medicine

## 2023-10-15 ENCOUNTER — Other Ambulatory Visit: Payer: Self-pay

## 2023-10-15 ENCOUNTER — Encounter (HOSPITAL_COMMUNITY): Payer: Self-pay | Admitting: Emergency Medicine

## 2023-10-15 DIAGNOSIS — S0990XA Unspecified injury of head, initial encounter: Secondary | ICD-10-CM | POA: Diagnosis not present

## 2023-10-15 DIAGNOSIS — M4802 Spinal stenosis, cervical region: Secondary | ICD-10-CM | POA: Diagnosis not present

## 2023-10-15 DIAGNOSIS — S065X0A Traumatic subdural hemorrhage without loss of consciousness, initial encounter: Secondary | ICD-10-CM | POA: Diagnosis not present

## 2023-10-15 DIAGNOSIS — M25532 Pain in left wrist: Secondary | ICD-10-CM | POA: Insufficient documentation

## 2023-10-15 DIAGNOSIS — I6782 Cerebral ischemia: Secondary | ICD-10-CM | POA: Diagnosis not present

## 2023-10-15 DIAGNOSIS — Z8673 Personal history of transient ischemic attack (TIA), and cerebral infarction without residual deficits: Secondary | ICD-10-CM | POA: Insufficient documentation

## 2023-10-15 DIAGNOSIS — R609 Edema, unspecified: Secondary | ICD-10-CM | POA: Diagnosis not present

## 2023-10-15 DIAGNOSIS — Z7982 Long term (current) use of aspirin: Secondary | ICD-10-CM | POA: Insufficient documentation

## 2023-10-15 DIAGNOSIS — Z87891 Personal history of nicotine dependence: Secondary | ICD-10-CM | POA: Insufficient documentation

## 2023-10-15 DIAGNOSIS — S0101XA Laceration without foreign body of scalp, initial encounter: Secondary | ICD-10-CM | POA: Diagnosis not present

## 2023-10-15 DIAGNOSIS — F0390 Unspecified dementia without behavioral disturbance: Secondary | ICD-10-CM | POA: Insufficient documentation

## 2023-10-15 DIAGNOSIS — S0003XA Contusion of scalp, initial encounter: Secondary | ICD-10-CM | POA: Diagnosis not present

## 2023-10-15 DIAGNOSIS — S199XXA Unspecified injury of neck, initial encounter: Secondary | ICD-10-CM | POA: Diagnosis not present

## 2023-10-15 DIAGNOSIS — W19XXXA Unspecified fall, initial encounter: Secondary | ICD-10-CM | POA: Insufficient documentation

## 2023-10-15 DIAGNOSIS — R000 Tachycardia, unspecified: Secondary | ICD-10-CM | POA: Diagnosis not present

## 2023-10-15 DIAGNOSIS — R918 Other nonspecific abnormal finding of lung field: Secondary | ICD-10-CM | POA: Diagnosis not present

## 2023-10-15 DIAGNOSIS — I10 Essential (primary) hypertension: Secondary | ICD-10-CM | POA: Insufficient documentation

## 2023-10-15 DIAGNOSIS — E876 Hypokalemia: Secondary | ICD-10-CM | POA: Diagnosis not present

## 2023-10-15 DIAGNOSIS — Z79899 Other long term (current) drug therapy: Secondary | ICD-10-CM | POA: Insufficient documentation

## 2023-10-15 DIAGNOSIS — R0989 Other specified symptoms and signs involving the circulatory and respiratory systems: Secondary | ICD-10-CM | POA: Diagnosis not present

## 2023-10-15 DIAGNOSIS — R9389 Abnormal findings on diagnostic imaging of other specified body structures: Secondary | ICD-10-CM | POA: Diagnosis not present

## 2023-10-15 DIAGNOSIS — D649 Anemia, unspecified: Secondary | ICD-10-CM | POA: Diagnosis not present

## 2023-10-15 DIAGNOSIS — M47811 Spondylosis without myelopathy or radiculopathy, occipito-atlanto-axial region: Secondary | ICD-10-CM | POA: Diagnosis not present

## 2023-10-15 DIAGNOSIS — M47812 Spondylosis without myelopathy or radiculopathy, cervical region: Secondary | ICD-10-CM | POA: Diagnosis not present

## 2023-10-15 DIAGNOSIS — Z043 Encounter for examination and observation following other accident: Secondary | ICD-10-CM | POA: Diagnosis not present

## 2023-10-15 DIAGNOSIS — S065XAA Traumatic subdural hemorrhage with loss of consciousness status unknown, initial encounter: Secondary | ICD-10-CM | POA: Diagnosis not present

## 2023-10-15 HISTORY — DX: Unspecified dementia, unspecified severity, without behavioral disturbance, psychotic disturbance, mood disturbance, and anxiety: F03.90

## 2023-10-15 LAB — CBC WITH DIFFERENTIAL/PLATELET
Abs Immature Granulocytes: 0.11 10*3/uL — ABNORMAL HIGH (ref 0.00–0.07)
Basophils Absolute: 0 10*3/uL (ref 0.0–0.1)
Basophils Relative: 0 %
Eosinophils Absolute: 0.1 10*3/uL (ref 0.0–0.5)
Eosinophils Relative: 1 %
HCT: 29.5 % — ABNORMAL LOW (ref 39.0–52.0)
Hemoglobin: 9.3 g/dL — ABNORMAL LOW (ref 13.0–17.0)
Immature Granulocytes: 1 %
Lymphocytes Relative: 5 %
Lymphs Abs: 0.7 10*3/uL (ref 0.7–4.0)
MCH: 30.9 pg (ref 26.0–34.0)
MCHC: 31.5 g/dL (ref 30.0–36.0)
MCV: 98 fL (ref 80.0–100.0)
Monocytes Absolute: 2.8 10*3/uL — ABNORMAL HIGH (ref 0.1–1.0)
Monocytes Relative: 22 %
Neutro Abs: 8.8 10*3/uL — ABNORMAL HIGH (ref 1.7–7.7)
Neutrophils Relative %: 71 %
Platelets: 128 10*3/uL — ABNORMAL LOW (ref 150–400)
RBC: 3.01 MIL/uL — ABNORMAL LOW (ref 4.22–5.81)
RDW: 14.3 % (ref 11.5–15.5)
WBC: 12.5 10*3/uL — ABNORMAL HIGH (ref 4.0–10.5)
nRBC: 0 % (ref 0.0–0.2)

## 2023-10-15 LAB — PROTIME-INR
INR: 1.2 (ref 0.8–1.2)
Prothrombin Time: 15.1 s (ref 11.4–15.2)

## 2023-10-15 LAB — COMPREHENSIVE METABOLIC PANEL
ALT: 34 U/L (ref 0–44)
AST: 36 U/L (ref 15–41)
Albumin: 3.4 g/dL — ABNORMAL LOW (ref 3.5–5.0)
Alkaline Phosphatase: 78 U/L (ref 38–126)
Anion gap: 11 (ref 5–15)
BUN: 18 mg/dL (ref 8–23)
CO2: 24 mmol/L (ref 22–32)
Calcium: 8.2 mg/dL — ABNORMAL LOW (ref 8.9–10.3)
Chloride: 100 mmol/L (ref 98–111)
Creatinine, Ser: 1.09 mg/dL (ref 0.61–1.24)
GFR, Estimated: >60 mL/min (ref >60)
Glucose, Bld: 163 mg/dL — ABNORMAL HIGH (ref 70–99)
Potassium: 3.2 mmol/L — ABNORMAL LOW (ref 3.5–5.1)
Sodium: 135 mmol/L (ref 135–145)
Total Bilirubin: 0.8 mg/dL (ref 0.0–1.2)
Total Protein: 6.7 g/dL (ref 6.5–8.1)

## 2023-10-15 LAB — URINALYSIS, W/ REFLEX TO CULTURE (INFECTION SUSPECTED)
Bacteria, UA: NONE SEEN
Bilirubin Urine: NEGATIVE
Glucose, UA: NEGATIVE mg/dL
Hgb urine dipstick: NEGATIVE
Ketones, ur: NEGATIVE mg/dL
Leukocytes,Ua: NEGATIVE
Nitrite: NEGATIVE
Protein, ur: >=300 mg/dL — AB
Specific Gravity, Urine: 1.017 (ref 1.005–1.030)
pH: 5 (ref 5.0–8.0)

## 2023-10-15 LAB — LACTIC ACID, PLASMA: Lactic Acid, Venous: 1.8 mmol/L (ref 0.5–1.9)

## 2023-10-15 MED ORDER — ACETAMINOPHEN 500 MG PO TABS
1000.0000 mg | ORAL_TABLET | Freq: Once | ORAL | Status: AC
  Administered 2023-10-15: 1000 mg via ORAL
  Filled 2023-10-15: qty 2

## 2023-10-15 MED ORDER — AZITHROMYCIN 250 MG PO TABS: 250.0000 mg | ORAL_TABLET | Freq: Every day | ORAL | 0 refills | Status: DC

## 2023-10-15 NOTE — ED Triage Notes (Signed)
 Pt bibems from [place]. Son who was visiting found him in floor. Pt had unwitnessed fall. Per ems staff was concerned for UTI-they performed UA and was negative. Pt arrived alert. Oriented to self/place/situation. Redness/swelling to left hand/wrist-deformity possibly present. Pulse strong. Palpated over bony prominences including spine with no pain except to left hand and wrist.

## 2023-10-15 NOTE — ED Notes (Signed)
 Patient transported to CT

## 2023-10-15 NOTE — ED Provider Notes (Signed)
 [place] EMERGENCY DEPARTMENT AT [facility] Provider Note  CSN: [id number] Arrival date & time: 10/15/23 1438  Chief Complaint(s) Fall  HPI Troy Thompson is a 89 y.o. male here today for a fall.  Patient is with his son, who tells me that he had gone to the patient's assisted living facility to pick him up for a dentist appointment, when the patient was down on the ground on his side.  Patient says that he lost his balance and fell down.  He is endorsing pain in his left wrist.  He is not on any blood thinners, denies head strike.   Past Medical History Past Medical History:  Diagnosis Date   Carotid artery occlusion    Dementia (HCC)    Hypertension    Pneumonia    Retinal embolus, right 12/30/2007   Sudden onset of blindness    Stroke [facility]    Patient Active Problem List   Diagnosis Date Noted   MGUS (monoclonal gammopathy of unknown significance) 04/01/2023   Hx of adenomatous colonic polyps 08/18/2014   Macrocytic anemia 08/18/2014   FH: colon cancer 08/18/2014   Aftercare following surgery of the circulatory system, NEC 03/01/2014   Fever and chills 01/24/2014   Occlusion and stenosis of carotid artery without mention of cerebral infarction 02/25/2012   Home Medication(s) Prior to Admission medications   Medication Sig Start Date End Date Taking? Authorizing Provider  acetaminophen  (TYLENOL ) 500 MG tablet Take 1,000 mg by mouth every 8 (eight) hours as needed for moderate pain (pain score 4-6).   Yes [provider]  amLODipine  (NORVASC ) 5 MG tablet Take 5 mg by mouth daily.   Yes [provider]  aspirin  81 MG tablet Take 81 mg by mouth at bedtime.    Yes [provider]  azithromycin  (ZITHROMAX ) 250 MG tablet Take 1 tablet (250 mg total) by mouth daily. Take first 2 tablets together, then 1 every day until finished. 10/15/23  Yes Mannie Pac T, DO  Cholecalciferol (VITAMIN D3) 50 MCG (2000 UT) capsule Take 2,000 Units by  mouth daily.   Yes [provider]  Multiple Vitamins-Minerals (PRESERVISION AREDS 2 PO) Take 1 tablet by mouth 2 (two) times daily.   Yes [provider]  omeprazole (PRILOSEC) 20 MG capsule Take 20 mg by mouth daily. 02/25/14  Yes [provider]  polyethylene glycol (MIRALAX  / GLYCOLAX ) 17 g packet Take 17 g by mouth daily.   Yes [provider]  senna-docusate (SENOKOT-S) 8.6-50 MG tablet Take 1 tablet by mouth daily as needed for mild constipation.   Yes [provider]  simvastatin  (ZOCOR ) 20 MG tablet Take 20 mg by mouth at bedtime.  02/04/12  Yes [provider]  losartan -hydrochlorothiazide  (HYZAAR) 100-25 MG tablet Take 1 tablet by mouth daily. 07/04/23 09/17/23  [provider]  Past Surgical History Past Surgical History:  Procedure Laterality Date   CAROTID ENDARTERECTOMY  01/26/2008   Right    CHOLECYSTECTOMY N/A 01/17/2014   Procedure: LAPAROSCOPIC CHOLECYSTECTOMY;  Surgeon: Oneil DELENA Budge, MD;  Location: [facility];  Service: General;  Laterality: N/A;   COLONOSCOPY  03/15/2008   MFM:Wnmfjo rectum/Left-sided transverse diverticula/Diminutive cecal polyp status post cold biopsy(Tubular adenoma)   COLONOSCOPY  09/24/2002   MFM:Wnmfjo rectum/Left-sided diverticulum/Polyp at the splenic flexure removed    HERNIA REPAIR     JOINT REPLACEMENT Right 2005   knee   PROSTATE SURGERY     30 yrs ago   TRANSURETHRAL RESECTION OF PROSTATE     Family History Family History  Problem Relation Age of Onset   Colon cancer Father        died at 36   Cancer Father        Colon   Heart attack Mother    Hypertension Daughter    Diabetes Son     Social History Social History   Tobacco Use   Smoking status: Former    Types: Cigarettes   Smokeless tobacco: Never  Vaping Use   Vaping status: Never  Used  Substance Use Topics   Alcohol  use: No   Drug use: No   Allergies Patient has no known allergies.  Review of Systems Review of Systems  Physical Exam Vital Signs  I have reviewed the triage vital signs BP (!) 150/54   Pulse 73   Temp 99.4 F (37.4 C) (Oral)   Resp (!) 23   SpO2 94%   Physical Exam Vitals reviewed.  HENT:     Head: Normocephalic and atraumatic.     Nose: Nose normal.  Eyes:     Pupils: Pupils are equal, round, and reactive to light.  Cardiovascular:     Rate and Rhythm: Normal rate.  Pulmonary:     Effort: Pulmonary effort is normal.  Abdominal:     General: Abdomen is flat.     Palpations: Abdomen is soft.  Musculoskeletal:     Cervical back: Normal range of motion and neck supple. No tenderness.     Comments: Swelling of the left wrist.  Patient able to wiggle fingers.  Intact sensation.  No tenderness over the bilateral elbows, shoulders, collarbones.  No chest tenderness.  No pain in the back.  Pelvis stable.  Patient able to lift both legs off the bed.  Neurological:     Mental Status: He is alert. Mental status is at baseline.     Cranial Nerves: No cranial nerve deficit.     ED Results and Treatments Labs (all labs ordered are listed, but only abnormal results are displayed) Labs Reviewed  COMPREHENSIVE METABOLIC PANEL - Abnormal; Notable for the following components:      Result Value   Potassium 3.2 (*)    Glucose, Bld 163 (*)    Calcium 8.2 (*)    Albumin 3.4 (*)    All other components within normal limits  CBC WITH DIFFERENTIAL/PLATELET - Abnormal; Notable for the following components:   WBC 12.5 (*)    RBC 3.01 (*)    Hemoglobin 9.3 (*)    HCT 29.5 (*)    Platelets 128 (*)    Neutro Abs 8.8 (*)    Monocytes Absolute 2.8 (*)    Abs Immature Granulocytes 0.11 (*)    All other components within normal limits  URINALYSIS, W/ REFLEX TO CULTURE (INFECTION SUSPECTED) - Abnormal; Notable for the  following components:    APPearance HAZY (*)    Protein, ur >=300 (*)    All other components within normal limits  CULTURE, BLOOD (ROUTINE X 2)  CULTURE, BLOOD (ROUTINE X 2)  LACTIC ACID, PLASMA  PROTIME-INR                                                                                                                          Radiology DG Wrist Complete Left Result Date: 10/15/2023 CLINICAL DATA:  Found down after unwitnessed fall with redness, swelling, and deformity of the wrist EXAM: LEFT WRIST - COMPLETE 4 VIEW COMPARISON:  None Available. FINDINGS: There is no evidence of acute displaced fracture or dislocation. Somewhat irregular appearance of the scaphoid without definite fracture lucency. Vascular calcifications. Mild soft tissue swelling about the hand and wrist. IMPRESSION: 1. No evidence of acute displaced fracture or dislocation. 2. Somewhat irregular appearance of the scaphoid without definite fracture lucency, which may be artifactual related to positioning or represent a nondisplaced fracture. Recommend correlation with point tenderness. Follow-up radiograph in 2 weeks could be obtained to evaluate for interval healing changes. Electronically Signed   By: Limin  Xu M.D.   On: 10/15/2023 16:58   DG Chest 2 View Result Date: 10/15/2023 CLINICAL DATA:  Found down after unwitnessed fall EXAM: CHEST - 2 VIEW COMPARISON:  Chest radiograph dated 10/23/2015 FINDINGS: Low lung volumes with bronchovascular crowding. Similar asymmetric elevation of the right hemidiaphragm. Bilateral lower lung linear opacities. Bibasilar patchy opacities. Blunting of the bilateral costophrenic angles. No pneumothorax. The heart size and mediastinal contours are within normal limits. No radiographic finding of acute displaced fracture. IMPRESSION: 1. Low lung volumes with bronchovascular crowding. Bibasilar patchy opacities, likely atelectasis. Aspiration or pneumonia can be considered in the appropriate clinical setting. 2. Blunting of the  bilateral costophrenic angles, which may represent trace pleural effusions. 3.  No radiographic finding of acute displaced fracture. Electronically Signed   By: Limin  Xu M.D.   On: 10/15/2023 16:54   CT Cervical Spine Wo Contrast Result Date: 10/15/2023 CLINICAL DATA:  Unwitnessed fall.  Found on the floor. EXAM: CT CERVICAL SPINE WITHOUT CONTRAST TECHNIQUE: Multidetector CT imaging of the cervical spine was performed without intravenous contrast. Multiplanar CT image reconstructions were also generated. RADIATION DOSE REDUCTION: This exam was performed according to the departmental dose-optimization program which includes automated exposure control, adjustment of the mA and/or kV according to patient size and/or use of iterative reconstruction technique. COMPARISON:  None Available. FINDINGS: Alignment: No malalignment. Skull base and vertebrae: No regional fracture. The anterior inferior corner of C7 is not included on the study. Soft tissues and spinal canal: No traumatic soft tissue finding. Disc levels: The foramen magnum is widely patent. There is ordinary mild osteoarthritis of the C1-2 articulation but no encroachment upon the neural structures. Ordinary degenerative spondylosis from C3-4 through C7-T1. Chronic facet arthritis. Probable fusion across the disc and facets at C5-6. No compressive central canal stenosis. Bony foraminal narrowing  related to spondylosis and facet arthritis. Upper chest: Limited.  Negative. Other: None IMPRESSION: 1. No acute or traumatic finding. The anterior inferior corner of C7 is not included on the study. 2. Ordinary degenerative spondylosis and facet arthritis. Probable fusion across the disc and facets at C5-6. Bony foraminal narrowing related to spondylosis and facet arthritis. Electronically Signed   By: Oneil Officer M.D.   On: 10/15/2023 16:26   CT Head Wo Contrast Result Date: 10/15/2023 CLINICAL DATA:  Unwitnessed fall.  Found on the floor. EXAM: CT HEAD WITHOUT  CONTRAST TECHNIQUE: Contiguous axial images were obtained from the base of the skull through the vertex without intravenous contrast. RADIATION DOSE REDUCTION: This exam was performed according to the departmental dose-optimization program which includes automated exposure control, adjustment of the mA and/or kV according to patient size and/or use of iterative reconstruction technique. COMPARISON:  MRI 05/27/2023 FINDINGS: Brain: Age related atrophy. Chronic small-vessel ischemic changes of the white matter. No sign of acute stroke, mass, hemorrhage, hydrocephalus or extra-axial collection. Vascular: There is atherosclerotic calcification of the major vessels at the base of the brain. Skull: Negative Sinuses/Orbits: Clear other than mild mucosal thickening along the maxillary sinus floors, likely secondary to adjacent periodontal disease. Orbits negative. Other: None IMPRESSION: No acute CT finding. Age related atrophy. Chronic small-vessel ischemic changes of the white matter. Electronically Signed   By: Oneil Officer M.D.   On: 10/15/2023 16:25    Pertinent labs & imaging results that were available during my care of the patient were reviewed by me and considered in my medical decision making (see MDM for details).  Medications Ordered in ED Medications  acetaminophen  (TYLENOL ) tablet 1,000 mg (1,000 mg Oral Given 10/15/23 1629)                                                                                                                                     Procedures Procedures  (including critical care time)  Medical Decision Making / ED Course   This patient presents to the ED for concern of fall, this involves an extensive number of treatment options, and is a complaint that carries with it a high risk of complications and morbidity.  The differential diagnosis includes wrist fracture, intracranial hemorrhage, fall, underlying infection, electrolyte abnormalities.  MDM: Will obtain imaging  of the patient's head and neck.  Basic labs ordered.  Patient has an elevated heart rate and respirations.  I think that this is likely secondary to pain given likely wrist fracture in the left wrist.  Plain films ordered.  Sounds with the patient could not him down on the ground for more than 10 to 15 minutes as the staff had just checked on the patient right before the son arrived.  Overall, aside from the wrist, patient looks well.  Reassessment 6 PM-patient's head CT, per my independent review shows no intracranial hemorrhage.  CT cervical spine negative.  Chest  x-ray shows questionable early infiltrate.  Will cover with azithromycin .  Wrist x-rays negative for acute fracture, possible scaphoid injury.  Have placed patient in a splint.  Will provide orthopedic follow-up.  Findings discussed with was at bedside.  Initial EKG showed question of atrial fibrillation, however P waves are present.  Repeat EKG confirmed no atrial fibrillation.   Additional history obtained: -Additional history obtained from son at bedside -External records from outside source obtained and reviewed including: Chart review including previous notes, labs, imaging, consultation notes   Lab Tests: -I ordered, reviewed, and interpreted labs.   The pertinent results include:   Labs Reviewed  COMPREHENSIVE METABOLIC PANEL - Abnormal; Notable for the following components:      Result Value   Potassium 3.2 (*)    Glucose, Bld 163 (*)    Calcium 8.2 (*)    Albumin 3.4 (*)    All other components within normal limits  CBC WITH DIFFERENTIAL/PLATELET - Abnormal; Notable for the following components:   WBC 12.5 (*)    RBC 3.01 (*)    Hemoglobin 9.3 (*)    HCT 29.5 (*)    Platelets 128 (*)    Neutro Abs 8.8 (*)    Monocytes Absolute 2.8 (*)    Abs Immature Granulocytes 0.11 (*)    All other components within normal limits  URINALYSIS, W/ REFLEX TO CULTURE (INFECTION SUSPECTED) - Abnormal; Notable for the following  components:   APPearance HAZY (*)    Protein, ur >=300 (*)    All other components within normal limits  CULTURE, BLOOD (ROUTINE X 2)  CULTURE, BLOOD (ROUTINE X 2)  LACTIC ACID, PLASMA  PROTIME-INR      EKG   EKG Interpretation Date/Time:  Wednesday October 15 2023 18:03:30 EST Ventricular Rate:  80 PR Interval:  245 QRS Duration:  89 QT Interval:  390 QTC Calculation: 450 R Axis:   53  Text Interpretation: Sinus rhythm Atrial premature complex Prolonged PR interval Probable LVH with secondary repol abnrm Confirmed by Mannie Pac [phone]) on 10/15/2023 6:05:52 PM         Imaging Studies ordered: I ordered imaging studies including  I independently visualized and interpreted imaging. I agree with the radiologist interpretation   Medicines ordered and prescription drug management: Meds ordered this encounter  Medications   acetaminophen  (TYLENOL ) tablet 1,000 mg   azithromycin  (ZITHROMAX ) 250 MG tablet    Sig: Take 1 tablet (250 mg total) by mouth daily. Take first 2 tablets together, then 1 every day until finished.    Dispense:  6 tablet    Refill:  0    -I have reviewed the patients home medicines and have made adjustments as needed   Cardiac Monitoring: The patient was maintained on a cardiac monitor.  I personally viewed and interpreted the cardiac monitored which showed an underlying rhythm of: Sinus rhythm  Social Determinants of Health:  Factors impacting patients care include: Dementia, assisted living facility resident   Reevaluation: After the interventions noted above, I reevaluated the patient and found that they have :improved  Co morbidities that complicate the patient evaluation  Past Medical History:  Diagnosis Date   Carotid artery occlusion    Dementia (HCC)    Hypertension    Pneumonia    Retinal embolus, right 12/30/2007   Sudden onset of blindness    Stroke [facility])       Dispostion: I considered admission for this patient,  however patient without acute injuries, reassuring vital signs  and labs, strong social support.     Final Clinical Impression(s) / ED Diagnoses Final diagnoses:  Fall, initial encounter     @PCDICTATION @    Mannie Pac T, DO 10/15/23 1810

## 2023-10-15 NOTE — Discharge Instructions (Addendum)
 While you are in the emergency room, you had CT scans done of your head and neck that were normal.  Your blood work was also normal.  Your chest x-ray showed a possible early pneumonia, and with everything that is going around I think is reasonable to start you on a short course of antibiotics.  You can pick these medicines up tomorrow.  The medicine azithromycin , you can take it as instructed by your dose packaging.  Please follow-up with Dr. Onesimo for repeat x-rays on the wrist within 1 to 2 weeks.  Follow-up with your primary care doctor within 1 to 2 weeks.

## 2023-10-17 ENCOUNTER — Encounter (HOSPITAL_COMMUNITY): Payer: Self-pay | Admitting: Emergency Medicine

## 2023-10-17 ENCOUNTER — Inpatient Hospital Stay (HOSPITAL_COMMUNITY)
Admission: EM | Admit: 2023-10-17 | Discharge: 2023-11-08 | DRG: 082 | Disposition: E | Payer: PPO | Source: Skilled Nursing Facility | Attending: Internal Medicine | Admitting: Internal Medicine

## 2023-10-17 ENCOUNTER — Emergency Department (HOSPITAL_COMMUNITY): Payer: PPO

## 2023-10-17 ENCOUNTER — Other Ambulatory Visit: Payer: Self-pay

## 2023-10-17 DIAGNOSIS — Z9181 History of falling: Secondary | ICD-10-CM

## 2023-10-17 DIAGNOSIS — S0003XA Contusion of scalp, initial encounter: Secondary | ICD-10-CM | POA: Diagnosis not present

## 2023-10-17 DIAGNOSIS — Z66 Do not resuscitate: Secondary | ICD-10-CM | POA: Diagnosis not present

## 2023-10-17 DIAGNOSIS — Z515 Encounter for palliative care: Secondary | ICD-10-CM | POA: Diagnosis not present

## 2023-10-17 DIAGNOSIS — Z8701 Personal history of pneumonia (recurrent): Secondary | ICD-10-CM

## 2023-10-17 DIAGNOSIS — I6200 Nontraumatic subdural hemorrhage, unspecified: Secondary | ICD-10-CM | POA: Diagnosis not present

## 2023-10-17 DIAGNOSIS — I629 Nontraumatic intracranial hemorrhage, unspecified: Secondary | ICD-10-CM | POA: Diagnosis not present

## 2023-10-17 DIAGNOSIS — Z87891 Personal history of nicotine dependence: Secondary | ICD-10-CM

## 2023-10-17 DIAGNOSIS — E8809 Other disorders of plasma-protein metabolism, not elsewhere classified: Secondary | ICD-10-CM | POA: Diagnosis present

## 2023-10-17 DIAGNOSIS — S065X0A Traumatic subdural hemorrhage without loss of consciousness, initial encounter: Secondary | ICD-10-CM | POA: Diagnosis not present

## 2023-10-17 DIAGNOSIS — Z96651 Presence of right artificial knee joint: Secondary | ICD-10-CM | POA: Diagnosis present

## 2023-10-17 DIAGNOSIS — Z800 Family history of malignant neoplasm of digestive organs: Secondary | ICD-10-CM

## 2023-10-17 DIAGNOSIS — Z751 Person awaiting admission to adequate facility elsewhere: Secondary | ICD-10-CM

## 2023-10-17 DIAGNOSIS — D649 Anemia, unspecified: Secondary | ICD-10-CM | POA: Diagnosis not present

## 2023-10-17 DIAGNOSIS — T502X5A Adverse effect of carbonic-anhydrase inhibitors, benzothiadiazides and other diuretics, initial encounter: Secondary | ICD-10-CM | POA: Diagnosis present

## 2023-10-17 DIAGNOSIS — Z79899 Other long term (current) drug therapy: Secondary | ICD-10-CM

## 2023-10-17 DIAGNOSIS — Z9049 Acquired absence of other specified parts of digestive tract: Secondary | ICD-10-CM

## 2023-10-17 DIAGNOSIS — R9082 White matter disease, unspecified: Secondary | ICD-10-CM | POA: Diagnosis not present

## 2023-10-17 DIAGNOSIS — Y92099 Unspecified place in other non-institutional residence as the place of occurrence of the external cause: Secondary | ICD-10-CM

## 2023-10-17 DIAGNOSIS — N179 Acute kidney failure, unspecified: Secondary | ICD-10-CM | POA: Diagnosis present

## 2023-10-17 DIAGNOSIS — Z833 Family history of diabetes mellitus: Secondary | ICD-10-CM

## 2023-10-17 DIAGNOSIS — R296 Repeated falls: Secondary | ICD-10-CM | POA: Diagnosis present

## 2023-10-17 DIAGNOSIS — R4182 Altered mental status, unspecified: Secondary | ICD-10-CM | POA: Diagnosis not present

## 2023-10-17 DIAGNOSIS — R54 Age-related physical debility: Secondary | ICD-10-CM | POA: Diagnosis present

## 2023-10-17 DIAGNOSIS — W01190A Fall on same level from slipping, tripping and stumbling with subsequent striking against furniture, initial encounter: Secondary | ICD-10-CM | POA: Diagnosis present

## 2023-10-17 DIAGNOSIS — M25532 Pain in left wrist: Secondary | ICD-10-CM | POA: Diagnosis present

## 2023-10-17 DIAGNOSIS — Z9889 Other specified postprocedural states: Secondary | ICD-10-CM

## 2023-10-17 DIAGNOSIS — D696 Thrombocytopenia, unspecified: Secondary | ICD-10-CM | POA: Diagnosis present

## 2023-10-17 DIAGNOSIS — Z8249 Family history of ischemic heart disease and other diseases of the circulatory system: Secondary | ICD-10-CM

## 2023-10-17 DIAGNOSIS — S065XAA Traumatic subdural hemorrhage with loss of consciousness status unknown, initial encounter: Principal | ICD-10-CM | POA: Diagnosis present

## 2023-10-17 DIAGNOSIS — R6511 Systemic inflammatory response syndrome (SIRS) of non-infectious origin with acute organ dysfunction: Secondary | ICD-10-CM | POA: Diagnosis not present

## 2023-10-17 DIAGNOSIS — I10 Essential (primary) hypertension: Secondary | ICD-10-CM | POA: Diagnosis present

## 2023-10-17 DIAGNOSIS — Z860101 Personal history of adenomatous and serrated colon polyps: Secondary | ICD-10-CM

## 2023-10-17 DIAGNOSIS — S0101XA Laceration without foreign body of scalp, initial encounter: Secondary | ICD-10-CM | POA: Diagnosis not present

## 2023-10-17 DIAGNOSIS — Z7189 Other specified counseling: Secondary | ICD-10-CM | POA: Diagnosis not present

## 2023-10-17 DIAGNOSIS — F0280 Dementia in other diseases classified elsewhere without behavioral disturbance: Secondary | ICD-10-CM | POA: Diagnosis present

## 2023-10-17 DIAGNOSIS — E876 Hypokalemia: Secondary | ICD-10-CM | POA: Diagnosis not present

## 2023-10-17 DIAGNOSIS — H5461 Unqualified visual loss, right eye, normal vision left eye: Secondary | ICD-10-CM | POA: Diagnosis present

## 2023-10-17 DIAGNOSIS — S0181XA Laceration without foreign body of other part of head, initial encounter: Secondary | ICD-10-CM | POA: Diagnosis present

## 2023-10-17 DIAGNOSIS — I160 Hypertensive urgency: Secondary | ICD-10-CM | POA: Diagnosis present

## 2023-10-17 DIAGNOSIS — Z8679 Personal history of other diseases of the circulatory system: Secondary | ICD-10-CM

## 2023-10-17 DIAGNOSIS — E782 Mixed hyperlipidemia: Secondary | ICD-10-CM | POA: Diagnosis present

## 2023-10-17 DIAGNOSIS — W19XXXA Unspecified fall, initial encounter: Principal | ICD-10-CM

## 2023-10-17 DIAGNOSIS — Z8673 Personal history of transient ischemic attack (TIA), and cerebral infarction without residual deficits: Secondary | ICD-10-CM

## 2023-10-17 DIAGNOSIS — F05 Delirium due to known physiological condition: Secondary | ICD-10-CM | POA: Diagnosis present

## 2023-10-17 DIAGNOSIS — M25432 Effusion, left wrist: Secondary | ICD-10-CM | POA: Diagnosis present

## 2023-10-17 DIAGNOSIS — I441 Atrioventricular block, second degree: Secondary | ICD-10-CM | POA: Diagnosis present

## 2023-10-17 DIAGNOSIS — Z7982 Long term (current) use of aspirin: Secondary | ICD-10-CM

## 2023-10-17 DIAGNOSIS — G9349 Other encephalopathy: Secondary | ICD-10-CM | POA: Diagnosis present

## 2023-10-17 DIAGNOSIS — Z9079 Acquired absence of other genital organ(s): Secondary | ICD-10-CM

## 2023-10-17 DIAGNOSIS — I6523 Occlusion and stenosis of bilateral carotid arteries: Secondary | ICD-10-CM | POA: Diagnosis not present

## 2023-10-17 DIAGNOSIS — K219 Gastro-esophageal reflux disease without esophagitis: Secondary | ICD-10-CM | POA: Diagnosis present

## 2023-10-17 NOTE — ED Provider Notes (Signed)
  EMERGENCY DEPARTMENT AT [facility] Provider Note   CSN: [id number] Arrival date & time: 10/17/23  2330     History  Chief Complaint  Patient presents with   Troy Thompson is a 89 y.o. male.  The history is provided by the patient.  Fall  History of hypertension, stroke, monoclonal gammopathy of unknown significance, dementia and comes here following several falls at the facility where he is living.  He had fallen 2 days ago and injured his left wrist, seen in the emergency department here.  He had 2 falls earlier in the day without obvious injury, and the fall this evening where he hit his head causing a laceration to his forehead.  Patient is not able to tell me how he fell.   Home Medications Prior to Admission medications   Medication Sig Start Date End Date Taking? Authorizing Provider  acetaminophen  (TYLENOL ) 500 MG tablet Take 1,000 mg by mouth every 8 (eight) hours as needed for moderate pain (pain score 4-6).    [provider]  amLODipine  (NORVASC ) 5 MG tablet Take 5 mg by mouth daily.    [provider]  aspirin  81 MG tablet Take 81 mg by mouth at bedtime.     [provider]  azithromycin  (ZITHROMAX ) 250 MG tablet Take 1 tablet (250 mg total) by mouth daily. Take first 2 tablets together, then 1 every day until finished. 10/15/23   Mannie Pac T, DO  azithromycin  (ZITHROMAX ) 250 MG tablet Take 1 tablet (250 mg total) by mouth daily. Take first 2 tablets together, then 1 every day until finished. 10/15/23   Mannie Pac ONEIDA, DO  Cholecalciferol (VITAMIN D3) 50 MCG (2000 UT) capsule Take 2,000 Units by mouth daily.    [provider]  losartan -hydrochlorothiazide  (HYZAAR) 100-25 MG tablet Take 1 tablet by mouth daily. 07/04/23 09/17/23  [provider]  Multiple Vitamins-Minerals (PRESERVISION AREDS 2 PO) Take 1 tablet by mouth 2 (two) times daily.    [provider]  omeprazole  (PRILOSEC) 20 MG capsule Take 20 mg by mouth daily. 02/25/14   [provider]  polyethylene glycol (MIRALAX  / GLYCOLAX ) 17 g packet Take 17 g by mouth daily.    [provider]  senna-docusate (SENOKOT-S) 8.6-50 MG tablet Take 1 tablet by mouth daily as needed for mild constipation.    [provider]  simvastatin  (ZOCOR ) 20 MG tablet Take 20 mg by mouth at bedtime.  02/04/12   [provider]      Allergies    Patient has no known allergies.    Review of Systems   Review of Systems  All other systems reviewed and are negative.   Physical Exam Updated Vital Signs BP (!) 188/68   Pulse 91   Temp 98 F (36.7 C)   Resp 20   Ht 5' 7 (1.702 m)   Wt 83.9 kg   SpO2 95%   BMI 28.98 kg/m  Physical Exam Vitals and nursing note reviewed.   89 year old male, resting comfortably and in no acute distress. Vital signs are significant for elevated blood pressure. Oxygen saturation is 95%, which is normal. Head is normocephalic.  There is ecchymosis of the frontal region of scalp, as well as a laceration oriented longitudinally. PERRLA, EOMI. Oropharynx is clear. Neck is nontender. Back is nontender and there is no CVA tenderness. Lungs are clear without rales, wheezes, or rhonchi. Chest is nontender. Heart has regular rate  and rhythm without murmur. Abdomen is soft, flat, nontender. Extremities: Left wrist is immobilized with a wrist brace which was not removed.  There is some swelling of the left hand.  There is full range of motion of all other joints without pain. Skin is warm and dry without rash. Neurologic: Awake and alert, speech is normal, cranial nerves are intact, strength is 5/5 in all 4 extremities.    ED Results / Procedures / Treatments   Labs (all labs ordered are listed, but only abnormal results are displayed) Labs Reviewed  COMPREHENSIVE METABOLIC PANEL - Abnormal; Notable for the following components:      Result Value   Sodium  133 (*)    Potassium 2.8 (*)    Glucose, Bld 121 (*)    BUN 28 (*)    Creatinine, Ser 1.29 (*)    Calcium 7.6 (*)    Total Protein 5.8 (*)    Albumin 2.8 (*)    GFR, Estimated 51 (*)    All other components within normal limits  CBC WITH DIFFERENTIAL/PLATELET - Abnormal; Notable for the following components:   RBC 2.47 (*)    Hemoglobin 7.9 (*)    HCT 24.3 (*)    Platelets 113 (*)    Monocytes Absolute 1.9 (*)    All other components within normal limits  URINALYSIS, W/ REFLEX TO CULTURE (INFECTION SUSPECTED)  MAGNESIUM     EKG EKG Interpretation Date/Time:  Friday October 17 2023 23:58:49 EST Ventricular Rate:  84 PR Interval:  310 QRS Duration:  91 QT Interval:  384 QTC Calculation: 454 R Axis:   54  Text Interpretation: Sinus rhythm with Mobitz I (Wenckebach) block Prolonged PR interval Borderline T abnormalities, lateral leads When compared with ECG of 10/15/2023 No significant change was found Confirmed by Raford Lenis ([id number]) on 10/18/2023 1:16:13 AM  Radiology CT Head Wo Contrast Result Date: 10/18/2023 CLINICAL DATA:  Initial evaluation for acute head trauma. EXAM: CT HEAD WITHOUT CONTRAST TECHNIQUE: Contiguous axial images were obtained from the base of the skull through the vertex without intravenous contrast. RADIATION DOSE REDUCTION: This exam was performed according to the departmental dose-optimization program which includes automated exposure control, adjustment of the mA and/or kV according to patient size and/or use of iterative reconstruction technique. COMPARISON:  Prior CT from 10/15/2023. FINDINGS: Brain: Cerebral volume within normal limits for age. Mild-to-moderate chronic microvascular ischemic disease. Acute extra-axial hemorrhage overlying the anterior left frontal convexity is seen, measuring up to 8 mm in maximal thickness (series 2, image 24). No significant mass effect or midline shift. This is likely subdural in location. Additional trace extra-axial  hemorrhage overlying the right temporal lobe measures up to 2 mm in thickness without significant mass effect (series 4, image 41). This is also likely subdural in location. No other acute intracranial hemorrhage. No acute large vessel territory infarct. No mass lesion or midline shift. No hydrocephalus. Vascular: No abnormal hyperdense vessel. Scattered vascular calcifications noted within the carotid siphons. Skull: Soft tissue contusion at the left frontal scalp. Calvarium intact without fracture. Sinuses/Orbits: Globes and orbital soft tissues demonstrate no acute finding. Mild mucosal thickening present about the ethmoidal air cells and maxillary sinuses. Mastoid air cells are clear. Other: None. IMPRESSION: 1. Acute extra-axial hemorrhage overlying the anterior left frontal convexity, measuring up to 8 mm in maximal thickness. No significant mass effect or midline shift. This is likely subdural in location. 2. Additional trace extra-axial hemorrhage overlying the right temporal lobe measuring up to 2 mm in  thickness without significant mass effect. This is also likely subdural in nature. 3. Soft tissue contusion at the left frontal scalp. No calvarial fracture. 4. Mild-to-moderate chronic microvascular ischemic disease. Critical Value/emergent results were called by telephone at the time of interpretation on 10/18/2023 at 12:33 am to provider Rital [facility] , who verbally acknowledged these results. Electronically Signed   By: Morene Hoard M.D.   On: 10/18/2023 00:37   CT Cervical Spine Wo Contrast Result Date: 10/18/2023 CLINICAL DATA:  Trauma/fall EXAM: CT CERVICAL SPINE WITHOUT CONTRAST TECHNIQUE: Multidetector CT imaging of the cervical spine was performed without intravenous contrast. Multiplanar CT image reconstructions were also generated. RADIATION DOSE REDUCTION: This exam was performed according to the departmental dose-optimization program which includes automated exposure control, adjustment  of the mA and/or kV according to patient size and/or use of iterative reconstruction technique. COMPARISON:  10/15/2023 FINDINGS: Alignment: Normal cervical lordosis. Skull base and vertebrae: No acute fracture. No primary bone lesion or focal pathologic process. Soft tissues and spinal canal: No prevertebral fluid or swelling. No visible canal hematoma. Disc levels: Mild degenerative changes of the mid/lower cervical spine. Spinal canal is patent. Upper chest: Visualized lung apices are clear. Other: Visualized thyroid  is unremarkable. IMPRESSION: No traumatic injury to the cervical spine. Mild degenerative changes. Electronically Signed   By: Pinkie Pebbles M.D.   On: 10/18/2023 00:32    Procedures .Laceration Repair  Date/Time: 10/18/2023 12:50 AM  Performed by: Raford Lenis, MD Authorized by: Raford Lenis, MD   Consent:    Consent obtained:  Verbal   Consent given by:  Patient   Risks discussed:  Infection and poor cosmetic result   Alternatives discussed:  No treatment Universal protocol:    Procedure explained and questions answered to patient or proxy's satisfaction: yes     Relevant documents present and verified: yes     Test results available: yes     Imaging studies available: yes     Required blood products, implants, devices, and special equipment available: yes     Site/side marked: yes     Immediately prior to procedure, a time out was called: yes     Patient identity confirmed:  Verbally with patient and arm band Anesthesia:    Anesthesia method:  None Laceration details:    Location:  Scalp   Scalp location:  Frontal   Length (cm):  2   Depth (mm):  3 Pre-procedure details:    Preparation:  Patient was prepped and draped in usual sterile fashion and imaging obtained to evaluate for foreign bodies Exploration:    Limited defect created (wound extended): no     Hemostasis achieved with:  Direct pressure   Imaging obtained: x-ray     Imaging outcome: foreign body  not noted     Wound exploration: entire depth of wound visualized     Wound extent: no foreign body and no underlying fracture     Contaminated: no   Treatment:    Area cleansed with:  Saline   Amount of cleaning:  Standard   Debridement:  None   Undermining:  None   Scar revision: no   Skin repair:    Repair method:  Tissue adhesive Approximation:    Approximation:  Close Repair type:    Repair type:  Simple Post-procedure details:    Dressing:  Open (no dressing)   Procedure completion:  Tolerated well, no immediate complications     Medications Ordered in ED Medications  potassium chloride  10 mEq  in 100 mL IVPB (10 mEq Intravenous New Bag/Given 10/18/23 0113)  magnesium  sulfate IVPB 2 g 50 mL (has no administration in time range)  potassium chloride  SA (KLOR-CON  M) CR tablet 40 mEq (40 mEq Oral Given 10/18/23 0113)    ED Course/ Medical Decision Making/ A&P                                 Medical Decision Making Amount and/or Complexity of Data Reviewed Labs: ordered. Radiology: ordered.  Risk Prescription drug management. Decision regarding hospitalization.   Multiple recent falls, most recent of which resulted in a head injury.  I have ordered CT of head and cervical spine.  Because of multiple falls, I am also doing workup to look for metabolic causes that may be contributing.  I have ordered CBC, comprehensive metabolic panel, troponin x 1, ECG, urinalysis.  I have reviewed his past records, and he was seen on 10/15/2023 following a fall with left wrist injury and questionable scaphoid fracture treated with wrist brace.  Blood pressure was not as high on that occasion.  No other ED visits for falls.  I have treated his laceration with closure with tissue adhesive.  CT of head shows subdural hematoma measuring up to 8 mm in maximal thickness, no mass effect on brain.  CT cervical spine shows no acute injury.  I have independently reviewed these images and also discussed  the findings directly with radiologist.  I agree with radiologist's interpretation.  I have discussed case with Dr. Gillie of neurosurgery service who recommends repeat CT scan in 6 hours, but does not believe any surgical intervention is warranted.  I have reviewed his laboratory tests, and my interpretation is mild hyponatremia which is not felt to be clinically significant, moderate to severe hypokalemia which may be contributing to his falls, renal insufficiency which is within his usual range, hypoalbuminemia which has progressed, normocytic normochromic anemia with 1.4 g drop in hemoglobin compared with 10/15/2023, stable thrombocytopenia.  Anemia and hypokalemia are likely contributing factors to his falls.  I have ordered both oral and intravenous potassium for him.  I have ordered a serum magnesium  level to see if he also needs magnesium  supplementation.  He is on a diuretic which is most likely the reason for his hypokalemia.  Because of drop in hemoglobin, I did a rectal exam and noted normal sphincter tone, small amount of light brown stool which was Hemoccult negative.  I have reviewed his electrocardiogram, and my interpretation is second-degree Mobitz 1 AV block.  I do not feel that this is a significant factor in his falls.  Magnesium  is low at 1.4, I have ordered intravenous magnesium .  Because of electrolyte disturbance, dropping hemoglobin, need to repeat CT of head, I believe that he needs to be admitted.  I have discussed case with Dr. Manfred of [facility], who agrees to admit the patient.  CRITICAL CARE Performed by: Alm Lias Total critical care time: 50 minutes Critical care time was exclusive of separately billable procedures and treating other patients. Critical care was necessary to treat or prevent imminent or life-threatening deterioration. Critical care was time spent personally by me on the following activities: development of treatment plan with patient and/or  surrogate as well as nursing, discussions with consultants, evaluation of patient's response to treatment, examination of patient, obtaining history from patient or surrogate, ordering and performing treatments and interventions, ordering and review of laboratory  studies, ordering and review of radiographic studies, pulse oximetry and re-evaluation of patient's condition.  Final Clinical Impression(s) / ED Diagnoses Final diagnoses:  Fall at nursing home, initial encounter  Subdural hematoma (HCC)  Diuretic-induced hypokalemia  Hypomagnesemia  Normochromic normocytic anemia  Thrombocytopenia (HCC)  Mobitz type 1 second degree AV block    Rx / DC Orders ED Discharge Orders     None         Raford Lenis, MD 10/18/23 0205

## 2023-10-17 NOTE — ED Notes (Signed)
 Patient transported to CT

## 2023-10-17 NOTE — ED Triage Notes (Signed)
 Pt bib EMS from [facility] after he fell out of bed hitting his head on corner of a dresser. Pt with an abrasion and hematoma to center of forehead. Dr Preston Fleeting at bedside.

## 2023-10-18 ENCOUNTER — Other Ambulatory Visit (HOSPITAL_COMMUNITY): Payer: PPO

## 2023-10-18 ENCOUNTER — Inpatient Hospital Stay (HOSPITAL_COMMUNITY): Payer: PPO

## 2023-10-18 DIAGNOSIS — I6782 Cerebral ischemia: Secondary | ICD-10-CM | POA: Diagnosis not present

## 2023-10-18 DIAGNOSIS — T502X5A Adverse effect of carbonic-anhydrase inhibitors, benzothiadiazides and other diuretics, initial encounter: Secondary | ICD-10-CM | POA: Diagnosis not present

## 2023-10-18 DIAGNOSIS — E876 Hypokalemia: Secondary | ICD-10-CM | POA: Insufficient documentation

## 2023-10-18 DIAGNOSIS — M47812 Spondylosis without myelopathy or radiculopathy, cervical region: Secondary | ICD-10-CM | POA: Diagnosis not present

## 2023-10-18 DIAGNOSIS — S0990XA Unspecified injury of head, initial encounter: Secondary | ICD-10-CM | POA: Diagnosis not present

## 2023-10-18 DIAGNOSIS — S0003XA Contusion of scalp, initial encounter: Secondary | ICD-10-CM | POA: Diagnosis not present

## 2023-10-18 DIAGNOSIS — G9349 Other encephalopathy: Secondary | ICD-10-CM | POA: Diagnosis not present

## 2023-10-18 DIAGNOSIS — D649 Anemia, unspecified: Secondary | ICD-10-CM | POA: Insufficient documentation

## 2023-10-18 DIAGNOSIS — Z515 Encounter for palliative care: Secondary | ICD-10-CM | POA: Diagnosis not present

## 2023-10-18 DIAGNOSIS — I160 Hypertensive urgency: Secondary | ICD-10-CM | POA: Diagnosis not present

## 2023-10-18 DIAGNOSIS — R296 Repeated falls: Secondary | ICD-10-CM | POA: Diagnosis not present

## 2023-10-18 DIAGNOSIS — Y92099 Unspecified place in other non-institutional residence as the place of occurrence of the external cause: Secondary | ICD-10-CM | POA: Diagnosis not present

## 2023-10-18 DIAGNOSIS — I10 Essential (primary) hypertension: Secondary | ICD-10-CM | POA: Insufficient documentation

## 2023-10-18 DIAGNOSIS — Z8673 Personal history of transient ischemic attack (TIA), and cerebral infarction without residual deficits: Secondary | ICD-10-CM

## 2023-10-18 DIAGNOSIS — F05 Delirium due to known physiological condition: Secondary | ICD-10-CM | POA: Diagnosis not present

## 2023-10-18 DIAGNOSIS — H5461 Unqualified visual loss, right eye, normal vision left eye: Secondary | ICD-10-CM | POA: Diagnosis not present

## 2023-10-18 DIAGNOSIS — I441 Atrioventricular block, second degree: Secondary | ICD-10-CM | POA: Diagnosis not present

## 2023-10-18 DIAGNOSIS — S065XAA Traumatic subdural hemorrhage with loss of consciousness status unknown, initial encounter: Secondary | ICD-10-CM | POA: Diagnosis present

## 2023-10-18 DIAGNOSIS — E782 Mixed hyperlipidemia: Secondary | ICD-10-CM | POA: Diagnosis not present

## 2023-10-18 DIAGNOSIS — Z7189 Other specified counseling: Secondary | ICD-10-CM | POA: Diagnosis not present

## 2023-10-18 DIAGNOSIS — R6511 Systemic inflammatory response syndrome (SIRS) of non-infectious origin with acute organ dysfunction: Secondary | ICD-10-CM | POA: Diagnosis not present

## 2023-10-18 DIAGNOSIS — N179 Acute kidney failure, unspecified: Secondary | ICD-10-CM | POA: Diagnosis not present

## 2023-10-18 DIAGNOSIS — M25432 Effusion, left wrist: Secondary | ICD-10-CM | POA: Diagnosis not present

## 2023-10-18 DIAGNOSIS — F0280 Dementia in other diseases classified elsewhere without behavioral disturbance: Secondary | ICD-10-CM | POA: Diagnosis not present

## 2023-10-18 DIAGNOSIS — D696 Thrombocytopenia, unspecified: Secondary | ICD-10-CM | POA: Diagnosis not present

## 2023-10-18 DIAGNOSIS — K219 Gastro-esophageal reflux disease without esophagitis: Secondary | ICD-10-CM | POA: Insufficient documentation

## 2023-10-18 DIAGNOSIS — E8809 Other disorders of plasma-protein metabolism, not elsewhere classified: Secondary | ICD-10-CM | POA: Diagnosis not present

## 2023-10-18 DIAGNOSIS — S199XXA Unspecified injury of neck, initial encounter: Secondary | ICD-10-CM | POA: Diagnosis not present

## 2023-10-18 DIAGNOSIS — Z66 Do not resuscitate: Secondary | ICD-10-CM | POA: Diagnosis not present

## 2023-10-18 DIAGNOSIS — W01190A Fall on same level from slipping, tripping and stumbling with subsequent striking against furniture, initial encounter: Secondary | ICD-10-CM | POA: Diagnosis present

## 2023-10-18 DIAGNOSIS — M25532 Pain in left wrist: Secondary | ICD-10-CM | POA: Diagnosis not present

## 2023-10-18 DIAGNOSIS — S0181XA Laceration without foreign body of other part of head, initial encounter: Secondary | ICD-10-CM | POA: Diagnosis not present

## 2023-10-18 LAB — COMPREHENSIVE METABOLIC PANEL
ALT: 15 U/L (ref 0–44)
ALT: 17 U/L (ref 0–44)
AST: 19 U/L (ref 15–41)
AST: 21 U/L (ref 15–41)
Albumin: 2.8 g/dL — ABNORMAL LOW (ref 3.5–5.0)
Albumin: 2.8 g/dL — ABNORMAL LOW (ref 3.5–5.0)
Alkaline Phosphatase: 57 U/L (ref 38–126)
Alkaline Phosphatase: 57 U/L (ref 38–126)
Anion gap: 9 (ref 5–15)
Anion gap: 9 (ref 5–15)
BUN: 24 mg/dL — ABNORMAL HIGH (ref 8–23)
BUN: 28 mg/dL — ABNORMAL HIGH (ref 8–23)
CO2: 22 mmol/L (ref 22–32)
CO2: 22 mmol/L (ref 22–32)
Calcium: 7.6 mg/dL — ABNORMAL LOW (ref 8.9–10.3)
Calcium: 7.7 mg/dL — ABNORMAL LOW (ref 8.9–10.3)
Chloride: 102 mmol/L (ref 98–111)
Chloride: 103 mmol/L (ref 98–111)
Creatinine, Ser: 1.12 mg/dL (ref 0.61–1.24)
Creatinine, Ser: 1.29 mg/dL — ABNORMAL HIGH (ref 0.61–1.24)
GFR, Estimated: 51 mL/min — ABNORMAL LOW (ref >60)
GFR, Estimated: >60 mL/min (ref >60)
Glucose, Bld: 105 mg/dL — ABNORMAL HIGH (ref 70–99)
Glucose, Bld: 121 mg/dL — ABNORMAL HIGH (ref 70–99)
Potassium: 2.8 mmol/L — ABNORMAL LOW (ref 3.5–5.1)
Potassium: 3.3 mmol/L — ABNORMAL LOW (ref 3.5–5.1)
Sodium: 133 mmol/L — ABNORMAL LOW (ref 135–145)
Sodium: 134 mmol/L — ABNORMAL LOW (ref 135–145)
Total Bilirubin: 0.8 mg/dL (ref 0.0–1.2)
Total Bilirubin: 0.8 mg/dL (ref 0.0–1.2)
Total Protein: 5.7 g/dL — ABNORMAL LOW (ref 6.5–8.1)
Total Protein: 5.8 g/dL — ABNORMAL LOW (ref 6.5–8.1)

## 2023-10-18 LAB — URINALYSIS, W/ REFLEX TO CULTURE (INFECTION SUSPECTED)
Bilirubin Urine: NEGATIVE
Glucose, UA: NEGATIVE mg/dL
Hgb urine dipstick: NEGATIVE
Ketones, ur: NEGATIVE mg/dL
Leukocytes,Ua: NEGATIVE
Nitrite: NEGATIVE
Protein, ur: 100 mg/dL — AB
Specific Gravity, Urine: 1.016 (ref 1.005–1.030)
pH: 5 (ref 5.0–8.0)

## 2023-10-18 LAB — CBC WITH DIFFERENTIAL/PLATELET
Abs Immature Granulocytes: 0.06 10*3/uL (ref 0.00–0.07)
Basophils Absolute: 0 10*3/uL (ref 0.0–0.1)
Basophils Relative: 0 %
Eosinophils Absolute: 0.1 10*3/uL (ref 0.0–0.5)
Eosinophils Relative: 1 %
HCT: 24.3 % — ABNORMAL LOW (ref 39.0–52.0)
Hemoglobin: 7.9 g/dL — ABNORMAL LOW (ref 13.0–17.0)
Immature Granulocytes: 1 %
Lymphocytes Relative: 12 %
Lymphs Abs: 1 10*3/uL (ref 0.7–4.0)
MCH: 32 pg (ref 26.0–34.0)
MCHC: 32.5 g/dL (ref 30.0–36.0)
MCV: 98.4 fL (ref 80.0–100.0)
Monocytes Absolute: 1.9 10*3/uL — ABNORMAL HIGH (ref 0.1–1.0)
Monocytes Relative: 24 %
Neutro Abs: 5 10*3/uL (ref 1.7–7.7)
Neutrophils Relative %: 62 %
Platelets: 113 10*3/uL — ABNORMAL LOW (ref 150–400)
RBC: 2.47 MIL/uL — ABNORMAL LOW (ref 4.22–5.81)
RDW: 14.7 % (ref 11.5–15.5)
WBC: 7.9 10*3/uL (ref 4.0–10.5)
nRBC: 0 % (ref 0.0–0.2)

## 2023-10-18 LAB — PHOSPHORUS: Phosphorus: 2.8 mg/dL (ref 2.5–4.6)

## 2023-10-18 LAB — MAGNESIUM
Magnesium: 1.4 mg/dL — ABNORMAL LOW (ref 1.7–2.4)
Magnesium: 1.9 mg/dL (ref 1.7–2.4)

## 2023-10-18 LAB — CBC
HCT: 24.8 % — ABNORMAL LOW (ref 39.0–52.0)
Hemoglobin: 7.7 g/dL — ABNORMAL LOW (ref 13.0–17.0)
MCH: 31.2 pg (ref 26.0–34.0)
MCHC: 31 g/dL (ref 30.0–36.0)
MCV: 100.4 fL — ABNORMAL HIGH (ref 80.0–100.0)
Platelets: 108 10*3/uL — ABNORMAL LOW (ref 150–400)
RBC: 2.47 MIL/uL — ABNORMAL LOW (ref 4.22–5.81)
RDW: 14.6 % (ref 11.5–15.5)
WBC: 6.5 10*3/uL (ref 4.0–10.5)
nRBC: 0 % (ref 0.0–0.2)

## 2023-10-18 LAB — POC OCCULT BLOOD, ED: Fecal Occult Blood: NEGATIVE

## 2023-10-18 MED ORDER — LOSARTAN POTASSIUM-HCTZ 100-25 MG PO TABS: 1.0000 | ORAL_TABLET | Freq: Every day | ORAL | Status: DC

## 2023-10-18 MED ORDER — LOSARTAN POTASSIUM 50 MG PO TABS
100.0000 mg | ORAL_TABLET | Freq: Every day | ORAL | Status: DC
  Administered 2023-10-18: 100 mg via ORAL
  Filled 2023-10-18: qty 4

## 2023-10-18 MED ORDER — ONDANSETRON HCL 4 MG/2ML IJ SOLN: 4.0000 mg | Freq: Four times a day (QID) | INTRAMUSCULAR | Status: DC | PRN

## 2023-10-18 MED ORDER — ASPIRIN 81 MG PO CHEW
81.0000 mg | CHEWABLE_TABLET | Freq: Every day | ORAL | Status: DC
  Filled 2023-10-18: qty 1

## 2023-10-18 MED ORDER — LORAZEPAM 2 MG/ML IJ SOLN
2.0000 mg | INTRAMUSCULAR | Status: DC | PRN
  Administered 2023-10-18 – 2023-10-20 (×6): 2 mg via INTRAVENOUS
  Filled 2023-10-18 (×6): qty 1

## 2023-10-18 MED ORDER — ACETAMINOPHEN 650 MG RE SUPP: 650.0000 mg | Freq: Four times a day (QID) | RECTAL | Status: DC | PRN

## 2023-10-18 MED ORDER — ACETAMINOPHEN 325 MG PO TABS: 650.0000 mg | ORAL_TABLET | Freq: Four times a day (QID) | ORAL | Status: DC | PRN

## 2023-10-18 MED ORDER — HYDRALAZINE HCL 20 MG/ML IJ SOLN: 10.0000 mg | Freq: Once | INTRAMUSCULAR | Status: DC

## 2023-10-18 MED ORDER — MAGNESIUM SULFATE 2 GM/50ML IV SOLN
2.0000 g | Freq: Once | INTRAVENOUS | Status: AC
  Administered 2023-10-18: 2 g via INTRAVENOUS
  Filled 2023-10-18: qty 50

## 2023-10-18 MED ORDER — PANTOPRAZOLE SODIUM 40 MG PO TBEC
40.0000 mg | DELAYED_RELEASE_TABLET | Freq: Every day | ORAL | Status: DC
  Administered 2023-10-18 – 2023-10-19 (×2): 40 mg via ORAL
  Filled 2023-10-18 (×2): qty 1

## 2023-10-18 MED ORDER — HALOPERIDOL LACTATE 5 MG/ML IJ SOLN
1.0000 mg | Freq: Four times a day (QID) | INTRAMUSCULAR | Status: DC | PRN
  Filled 2023-10-18: qty 1

## 2023-10-18 MED ORDER — AMLODIPINE BESYLATE 5 MG PO TABS
5.0000 mg | ORAL_TABLET | Freq: Every day | ORAL | Status: DC
  Administered 2023-10-18 – 2023-10-19 (×2): 5 mg via ORAL
  Filled 2023-10-18 (×2): qty 1

## 2023-10-18 MED ORDER — HYDRALAZINE HCL 20 MG/ML IJ SOLN
10.0000 mg | Freq: Four times a day (QID) | INTRAMUSCULAR | Status: DC | PRN
  Administered 2023-10-18 (×2): 10 mg via INTRAVENOUS
  Filled 2023-10-18 (×3): qty 1

## 2023-10-18 MED ORDER — ONDANSETRON HCL 4 MG PO TABS: 4.0000 mg | ORAL_TABLET | Freq: Four times a day (QID) | ORAL | Status: DC | PRN

## 2023-10-18 MED ORDER — POTASSIUM CHLORIDE CRYS ER 20 MEQ PO TBCR
40.0000 meq | EXTENDED_RELEASE_TABLET | Freq: Once | ORAL | Status: AC
  Administered 2023-10-18: 40 meq via ORAL
  Filled 2023-10-18: qty 2

## 2023-10-18 MED ORDER — SIMVASTATIN 20 MG PO TABS
20.0000 mg | ORAL_TABLET | Freq: Every day | ORAL | Status: DC
  Administered 2023-10-19: 20 mg via ORAL
  Filled 2023-10-18 (×2): qty 1

## 2023-10-18 MED ORDER — HYDROCHLOROTHIAZIDE 25 MG PO TABS
25.0000 mg | ORAL_TABLET | Freq: Every day | ORAL | Status: DC
  Administered 2023-10-18: 25 mg via ORAL
  Filled 2023-10-18: qty 1

## 2023-10-18 MED ORDER — POTASSIUM CHLORIDE 10 MEQ/100ML IV SOLN
10.0000 meq | INTRAVENOUS | Status: AC
Start: 2023-10-18 — End: 2023-10-18
  Administered 2023-10-18 (×2): 10 meq via INTRAVENOUS
  Filled 2023-10-18 (×2): qty 100

## 2023-10-18 NOTE — H&P (Addendum)
 History and Physical    Patient: Troy Thompson FMW:[id number] DOB: 1924/11/11 DOA: 10/17/2023 DOS: the patient was seen and examined on 10/18/2023 PCP: Sheryle Carwin, MD  Patient coming from: ALF/ILF  Chief Complaint:  Chief Complaint  Patient presents with   Fall   HPI: Troy Thompson is a 89 y.o. male with medical history significant of HTN, HLD, GERD, prior stroke who presents to the emergency department from the [facility] via EMS after sustaining a fall and hitting his head on a corner of a dresser.  Patient was unable to provide history of his falls possibly due to an underlying dementia, history was obtained from ED physician and ED medical record.  Per report, patient has had several falls at the facility where he lives.  He fell 3 days ago injuring his left wrist and was seen in the ED.  He had 2 falls earlier in the day yesterday without any obvious injury, and he sustained another fall in the evening whereby he had a laceration to his forehead.  ED Course:  In the emergency department, BP was 180/68, otherwise vital signs were within normal range.  Workup in the ED showed normocytic anemia.  BMP showed sodium of 133, potassium 2.8, chloride 102, bicarb 22, blood glucose 121, BUN 28, creatinine 1.29, albumin 2.8, magnesium  1.4, fecal occult blood was negative.  Blood culture pending. CT head without contrast showed: 1. Acute extra-axial hemorrhage overlying the anterior left frontal convexity, measuring up to 8 mm in maximal thickness. No significant mass effect or midline shift. This is likely subdural in location. 2. Additional trace extra-axial hemorrhage overlying the right temporal lobe measuring up to 2 mm in thickness without significant mass effect. This is also likely subdural in nature. 3. Soft tissue contusion at the left frontal scalp. No calvarial fracture. 4. Mild-to-moderate chronic microvascular ischemic disease.  CT cervical spine without  contrast showed no traumatic injury to the cervical spine. Potassium and magnesium  were replenished. Neurosurgeon on-call (Dr. Gillie, Rockey) was consulted and recommended repeating CT scan in 6 hours from prior CT scan and to monitor patient's exam for neurological decline.  He was declared not to be an operative candidate. Hospitalist was asked to admit patient for further evaluation and management.   Review of Systems: Review of systems as noted in the HPI. All other systems reviewed and are negative.   Past Medical History:  Diagnosis Date   Carotid artery occlusion    Dementia (HCC)    Hypertension    Pneumonia    Retinal embolus, right 12/30/2007   Sudden onset of blindness    Stroke [facility])    Past Surgical History:  Procedure Laterality Date   CAROTID ENDARTERECTOMY  01/26/2008   Right    CHOLECYSTECTOMY N/A 01/17/2014   Procedure: LAPAROSCOPIC CHOLECYSTECTOMY;  Surgeon: Oneil DELENA Budge, MD;  Location: [facility];  Service: General;  Laterality: N/A;   COLONOSCOPY  03/15/2008   MFM:Wnmfjo rectum/Left-sided transverse diverticula/Diminutive cecal polyp status post cold biopsy(Tubular adenoma)   COLONOSCOPY  09/24/2002   MFM:Wnmfjo rectum/Left-sided diverticulum/Polyp at the splenic flexure removed    HERNIA REPAIR     JOINT REPLACEMENT Right 2005   knee   PROSTATE SURGERY     30 yrs ago   TRANSURETHRAL RESECTION OF PROSTATE      Social History:  reports that he has quit smoking. His smoking use included cigarettes. He has never used smokeless tobacco. He reports that he does not drink alcohol  and does  not use drugs.   No Known Allergies  Family History  Problem Relation Age of Onset   Colon cancer Father        died at 58   Cancer Father        Colon   Heart attack Mother    Hypertension Daughter    Diabetes Son      Prior to Admission medications   Medication Sig Start Date End Date Taking? Authorizing Provider  acetaminophen  (TYLENOL ) 500 MG tablet Take 1,000  mg by mouth every 8 (eight) hours as needed for moderate pain (pain score 4-6).    [provider]  amLODipine  (NORVASC ) 5 MG tablet Take 5 mg by mouth daily.    [provider]  aspirin  81 MG tablet Take 81 mg by mouth at bedtime.     [provider]  azithromycin  (ZITHROMAX ) 250 MG tablet Take 1 tablet (250 mg total) by mouth daily. Take first 2 tablets together, then 1 every day until finished. 10/15/23   Mannie Pac T, DO  azithromycin  (ZITHROMAX ) 250 MG tablet Take 1 tablet (250 mg total) by mouth daily. Take first 2 tablets together, then 1 every day until finished. 10/15/23   Mannie Pac DASEN, DO  Cholecalciferol (VITAMIN D3) 50 MCG (2000 UT) capsule Take 2,000 Units by mouth daily.    [provider]  losartan -hydrochlorothiazide  (HYZAAR) 100-25 MG tablet Take 1 tablet by mouth daily. 07/04/23 09/17/23  [provider]  Multiple Vitamins-Minerals (PRESERVISION AREDS 2 PO) Take 1 tablet by mouth 2 (two) times daily.    [provider]  omeprazole (PRILOSEC) 20 MG capsule Take 20 mg by mouth daily. 02/25/14   [provider]  polyethylene glycol (MIRALAX  / GLYCOLAX ) 17 g packet Take 17 g by mouth daily.    [provider]  senna-docusate (SENOKOT-S) 8.6-50 MG tablet Take 1 tablet by mouth daily as needed for mild constipation.    [provider]  simvastatin  (ZOCOR ) 20 MG tablet Take 20 mg by mouth at bedtime.  02/04/12   [provider]    Physical Exam: BP (!) 168/82   Pulse 82   Temp 98 F (36.7 C)   Resp 18   Ht 5' 7 (1.702 m)   Wt 83.9 kg   SpO2 97%   BMI 28.98 kg/m   General: 89 y.o. year-old male well developed well nourished in no acute distress.  Alert and oriented x3. HEENT: Normocephalic, noted ecchymosis in frontal area of scalp EOMI Neck: Supple, trachea medial Cardiovascular: Regular rate and rhythm with no rubs or gallops.  No thyromegaly or JVD noted.  No lower extremity edema.  2/4 pulses in all 4 extremities. Respiratory: Clear to auscultation with no wheezes or rales. Good inspiratory effort. Abdomen: Soft, nontender nondistended with normal bowel sounds x4 quadrants. Muskuloskeletal: Noted wrist brace on left wrist.  No cyanosis, clubbing or edema noted bilaterally Neuro: CN II-XII intact, strength 5/5 x 4, sensation, reflexes intact Skin: No ulcerative lesions noted or rashes Psychiatry: Mood is appropriate for condition and setting            Labs on Admission:  Basic Metabolic Panel: Recent Labs  Lab 10/15/23 1511 10/17/23 2355  NA 135 133*  K 3.2* 2.8*  CL 100 102  CO2 24 22  GLUCOSE 163* 121*  BUN 18 28*  CREATININE 1.09 1.29*  CALCIUM 8.2* 7.6*  MG  --  1.4*   Liver Function Tests: Recent Labs  Lab 10/15/23 1511 10/17/23  2355  AST 36 21  ALT 34 17  ALKPHOS 78 57  BILITOT 0.8 0.8  PROT 6.7 5.8*  ALBUMIN 3.4* 2.8*   No results for input(s): LIPASE, AMYLASE in the last 168 hours. No results for input(s): AMMONIA in the last 168 hours. CBC: Recent Labs  Lab 10/15/23 1511 10/17/23 2355  WBC 12.5* 7.9  NEUTROABS 8.8* 5.0  HGB 9.3* 7.9*  HCT 29.5* 24.3*  MCV 98.0 98.4  PLT 128* 113*   Cardiac Enzymes: No results for input(s): CKTOTAL, CKMB, CKMBINDEX, TROPONINI in the last 168 hours.  BNP (last 3 results) No results for input(s): BNP in the last 8760 hours.  ProBNP (last 3 results) No results for input(s): PROBNP in the last 8760 hours.  CBG: No results for input(s): GLUCAP in the last 168 hours.  Radiological Exams on Admission: CT Head Wo Contrast Result Date: 10/18/2023 CLINICAL DATA:  Initial evaluation for acute head trauma. EXAM: CT HEAD WITHOUT CONTRAST TECHNIQUE: Contiguous axial images were obtained from the base of the skull through the vertex without intravenous contrast. RADIATION DOSE REDUCTION: This exam was performed according to the departmental dose-optimization program which includes  automated exposure control, adjustment of the mA and/or kV according to patient size and/or use of iterative reconstruction technique. COMPARISON:  Prior CT from 10/15/2023. FINDINGS: Brain: Cerebral volume within normal limits for age. Mild-to-moderate chronic microvascular ischemic disease. Acute extra-axial hemorrhage overlying the anterior left frontal convexity is seen, measuring up to 8 mm in maximal thickness (series 2, image 24). No significant mass effect or midline shift. This is likely subdural in location. Additional trace extra-axial hemorrhage overlying the right temporal lobe measures up to 2 mm in thickness without significant mass effect (series 4, image 41). This is also likely subdural in location. No other acute intracranial hemorrhage. No acute large vessel territory infarct. No mass lesion or midline shift. No hydrocephalus. Vascular: No abnormal hyperdense vessel. Scattered vascular calcifications noted within the carotid siphons. Skull: Soft tissue contusion at the left frontal scalp. Calvarium intact without fracture. Sinuses/Orbits: Globes and orbital soft tissues demonstrate no acute finding. Mild mucosal thickening present about the ethmoidal air cells and maxillary sinuses. Mastoid air cells are clear. Other: None. IMPRESSION: 1. Acute extra-axial hemorrhage overlying the anterior left frontal convexity, measuring up to 8 mm in maximal thickness. No significant mass effect or midline shift. This is likely subdural in location. 2. Additional trace extra-axial hemorrhage overlying the right temporal lobe measuring up to 2 mm in thickness without significant mass effect. This is also likely subdural in nature. 3. Soft tissue contusion at the left frontal scalp. No calvarial fracture. 4. Mild-to-moderate chronic microvascular ischemic disease. Critical Value/emergent results were called by telephone at the time of interpretation on 10/18/2023 at 12:33 am to provider DAVID [facility] , who verbally  acknowledged these results. Electronically Signed   By: Morene Hoard M.D.   On: 10/18/2023 00:37   CT Cervical Spine Wo Contrast Result Date: 10/18/2023 CLINICAL DATA:  Trauma/fall EXAM: CT CERVICAL SPINE WITHOUT CONTRAST TECHNIQUE: Multidetector CT imaging of the cervical spine was performed without intravenous contrast. Multiplanar CT image reconstructions were also generated. RADIATION DOSE REDUCTION: This exam was performed according to the departmental dose-optimization program which includes automated exposure control, adjustment of the mA and/or kV according to patient size and/or use of iterative reconstruction technique. COMPARISON:  10/15/2023 FINDINGS: Alignment: Normal cervical lordosis. Skull base and vertebrae: No acute fracture. No primary bone lesion or focal pathologic process. Soft tissues and  spinal canal: No prevertebral fluid or swelling. No visible canal hematoma. Disc levels: Mild degenerative changes of the mid/lower cervical spine. Spinal canal is patent. Upper chest: Visualized lung apices are clear. Other: Visualized thyroid  is unremarkable. IMPRESSION: No traumatic injury to the cervical spine. Mild degenerative changes. Electronically Signed   By: Pinkie Pebbles M.D.   On: 10/18/2023 00:32    EKG: I independently viewed the EKG done and my findings are as followed: Sinus rhythm with Mobitz 1 block at a rate of 84 bpm  Assessment/Plan Present on Admission:  Subdural hematoma (HCC)  Principal Problem:   Subdural hematoma (HCC) Active Problems:   Multiple falls   Hypokalemia   Hypomagnesemia   Normocytic anemia   Essential hypertension   Mixed hyperlipidemia   GERD (gastroesophageal reflux disease)   History of stroke   Left wrist pain  Subdural hematoma in the setting of multiple falls CT head without contrast was suggestive of subdural hematoma Neurosurgeon on-call (Dr. Gillie, Rockey) was consulted and recommended repeating CT scan in 6 hours from  prior CT scan and to monitor patient's exam for neurological decline. Continue fall precaution Continue PT/OT eval and treat  Left wrist pain due to multiple falls Left wrist swelling and immobilized with a brace Continue fall precaution Continue Tylenol  as needed Continue PT/OT eval and treat  Hypokalemia K+ is 2.8 K+ will be replenished Please monitor for AM K+ for further replenishmemnt  Hypomagnesemia Mg level is 1.4 This will be replenished Please continue to monitor Mg level and correct accordingly  Normocytic anemia H/H= 7.9/24.3, this was 9.3/29.5 on 1/08 Hemoccult was negative Continue to monitor H/H with morning labs  Hypertensive urgency Essential hypertension Continue IV hydralazine  10 mg every 6 hours as needed for SBP > 170 Continue amlodipine , Hyzaar  Mixed hyperlipidemia Continue Zocor   GERD Continue Protonix   History of stroke Continue aspirin , Zocor   DVT prophylaxis: SCDs  Code Status: Full code  Family Communication: None at bedside  Consults: None  Severity of Illness: The appropriate patient status for this patient is INPATIENT. Inpatient status is judged to be reasonable and necessary in order to provide the required intensity of service to ensure the patient's safety. The patient's presenting symptoms, physical exam findings, and initial radiographic and laboratory data in the context of their chronic comorbidities is felt to place them at high risk for further clinical deterioration. Furthermore, it is not anticipated that the patient will be medically stable for discharge from the hospital within 2 midnights of admission.   * I certify that at the point of admission it is my clinical judgment that the patient will require inpatient hospital care spanning beyond 2 midnights from the point of admission due to high intensity of service, high risk for further deterioration and high frequency of surveillance required.*  Author: Hamdi Vari,  DO 10/18/2023 2:51 AM  For on call review [email or web address].

## 2023-10-18 NOTE — Progress Notes (Signed)
 Patient has a POA, his son, Dorinda Hill. Information on the chart.

## 2023-10-18 NOTE — Progress Notes (Signed)
 Patient ID: Troy Thompson, male   DOB: 06-05-1925, 89 y.o.   MRN: [id number] BP (!) 188/68   Pulse 91   Temp 98 F (36.7 C)   Resp 20   Ht 5' 7 (1.702 m)   Wt 83.9 kg   SpO2 95%   BMI 28.98 kg/m  Head CT reviewed. Small amount of subdural blood. His current exam is normal. He is not an operative candidate. Would repeat scan in six hours. Monitor his exam for neurological decline.

## 2023-10-18 NOTE — ED Notes (Signed)
 Breakfast tray provided to patient.

## 2023-10-18 NOTE — Progress Notes (Signed)
 Patient previously able to be redirected has become hard to manage at this time. Bed alarm placed on medium level and patient quite quick. He has a fall mat, yellow socks, bed alarm and bed rails. We have notified his son and his son has spoken to him; with no steady redirection. Patient was concerned about a ringing phone, unable to differ between noises outside his room and in, and unable to orient to the fact he is in his room at the hospital and not at home. A nurse walked by the door; patient stated Oh is that your kid? Started taking off his wrist split; expressed that he cannot do that for protection of his healing wrist. He is unable to comprehend. Notified Dr. Maree; new orders. Haldol  IV given per order and tele sitter to be brought up by [facility]. Writer at bedside at this time.

## 2023-10-18 NOTE — Plan of Care (Signed)

## 2023-10-18 NOTE — Progress Notes (Signed)
 PROGRESS NOTE    Troy Thompson  FMW:[id number] DOB: November 12, 1924 DOA: 10/17/2023 PCP: Sheryle Carwin, MD   Brief Narrative:    Troy Thompson is a 89 y.o. male with medical history significant of HTN, HLD, GERD, prior stroke who presents to the emergency department from the [facility] via EMS after sustaining a fall and hitting his head on a corner of a dresser.  Patient was unable to provide history of his falls possibly due to an underlying dementia.  Patient was noted to have subdural hematoma that appears stable on imaging.  He has been assessed by PT with need for home health services.  Assessment & Plan:   Principal Problem:   Subdural hematoma (HCC) Active Problems:   Multiple falls   Hypokalemia   Hypomagnesemia   Normocytic anemia   Essential hypertension   Mixed hyperlipidemia   GERD (gastroesophageal reflux disease)   History of stroke   Left wrist pain  Assessment and Plan:  Subdural hematoma in the setting of multiple falls CT head without contrast was suggestive of subdural hematoma Neurosurgeon on-call (Dr. Gillie, Rockey) was consulted and recommended repeating CT scan in 6 hours and repeat CT appears stable  Continue fall precaution Evaluated by PT with recommendations for home health services, no further neurological decline noted   Left wrist pain due to multiple falls Left wrist swelling and immobilized with a brace Continue fall precaution Continue Tylenol  as needed Continue PT/OT eval and treat   Hypokalemia Continue to replete and monitor in a.m.   Hypomagnesemia Repleted, continue to monitor   Normocytic anemia H/H= 7.9/24.3, this was 9.3/29.5 on 1/08 Hemoccult was negative Continue to monitor H/H with morning labs   Hypertensive urgency Essential hypertension Continue IV hydralazine  10 mg every 6 hours as needed for SBP > 170 Continue amlodipine , Hyzaar   Mixed hyperlipidemia Continue Zocor    GERD Continue  Protonix    History of stroke Continue aspirin , Zocor    DVT prophylaxis: SCDs Code Status: Full Family Communication: None at bedside Disposition Plan:  Status is: Inpatient Remains inpatient appropriate because: Need for close monitoring.   Consultants:  Discussed with neurosurgery  Procedures:  None  Antimicrobials:  None   Subjective: Patient seen and evaluated today with no new acute complaints or concerns. No acute concerns or events noted overnight.  He denies any headache or pain.  Objective: Vitals:   10/18/23 0819 10/18/23 0922 10/18/23 1253 10/18/23 1400  BP: (!) 186/92 (!) 159/47 (!) 152/60 (!) 190/62  Pulse: 99  96 99  Resp: 20   20  Temp: 98.1 F (36.7 C)  97.9 F (36.6 C)   TempSrc: Oral  Oral   SpO2: 98%  97% 96%  Weight:      Height:       No intake or output data in the 24 hours ending 10/18/23 1441 Filed Weights   10/17/23 2341  Weight: 83.9 kg    Examination:  General exam: Appears calm and comfortable  Respiratory system: Clear to auscultation. Respiratory effort normal. Cardiovascular system: S1 & S2 heard, RRR.  Gastrointestinal system: Abdomen is soft Central nervous system: Alert and awake Extremities: No edema Skin: No significant lesions noted Psychiatry: Flat affect.    Data Reviewed: I have personally reviewed following labs and imaging studies  CBC: Recent Labs  Lab 10/15/23 1511 10/17/23 2355 10/18/23 0501  WBC 12.5* 7.9 6.5  NEUTROABS 8.8* 5.0  --   HGB 9.3* 7.9* 7.7*  HCT 29.5* 24.3* 24.8*  MCV 98.0 98.4 100.4*  PLT 128* 113* 108*   Basic Metabolic Panel: Recent Labs  Lab 10/15/23 1511 10/17/23 2355 10/18/23 0501  NA 135 133* 134*  K 3.2* 2.8* 3.3*  CL 100 102 103  CO2 24 22 22   GLUCOSE 163* 121* 105*  BUN 18 28* 24*  CREATININE 1.09 1.29* 1.12  CALCIUM 8.2* 7.6* 7.7*  MG  --  1.4* 1.9  PHOS  --   --  2.8   GFR: Estimated Creatinine Clearance: 39.9 mL/min (by C-G formula based on SCr of 1.12  mg/dL). Liver Function Tests: Recent Labs  Lab 10/15/23 1511 10/17/23 2355 10/18/23 0501  AST 36 21 19  ALT 34 17 15  ALKPHOS 78 57 57  BILITOT 0.8 0.8 0.8  PROT 6.7 5.8* 5.7*  ALBUMIN 3.4* 2.8* 2.8*   No results for input(s): LIPASE, AMYLASE in the last 168 hours. No results for input(s): AMMONIA in the last 168 hours. Coagulation Profile: Recent Labs  Lab 10/15/23 1511  INR 1.2   Cardiac Enzymes: No results for input(s): CKTOTAL, CKMB, CKMBINDEX, TROPONINI in the last 168 hours. BNP (last 3 results) No results for input(s): PROBNP in the last 8760 hours. HbA1C: No results for input(s): HGBA1C in the last 72 hours. CBG: No results for input(s): GLUCAP in the last 168 hours. Lipid Profile: No results for input(s): CHOL, HDL, LDLCALC, TRIG, CHOLHDL, LDLDIRECT in the last 72 hours. Thyroid  Function Tests: No results for input(s): TSH, T4TOTAL, FREET4, T3FREE, THYROIDAB in the last 72 hours. Anemia Panel: No results for input(s): VITAMINB12, FOLATE, FERRITIN, TIBC, IRON, RETICCTPCT in the last 72 hours. Sepsis Labs: Recent Labs  Lab 10/15/23 1512  LATICACIDVEN 1.8    Recent Results (from the past 240 hours)  Culture, blood (Routine x 2)     Status: None (Preliminary result)   Collection Time: 10/15/23  3:11 PM   Specimen: BLOOD  Result Value Ref Range Status   Specimen Description BLOOD RIGHT ASSIST CONTROL  Final   Special Requests   Final    BOTTLES DRAWN AEROBIC AND ANAEROBIC Blood Culture adequate volume   Culture   Final    NO GROWTH 3 DAYS Performed at [facility], [address], [place], [place] [address]    Report Status PENDING  Incomplete  Culture, blood (Routine x 2)     Status: None (Preliminary result)   Collection Time: 10/15/23  3:59 PM   Specimen: BLOOD RIGHT FOREARM  Result Value Ref Range Status   Specimen Description BLOOD RIGHT FOREARM  Final   Special Requests   Final    BOTTLES  DRAWN AEROBIC AND ANAEROBIC Blood Culture adequate volume   Culture   Final    NO GROWTH 3 DAYS Performed at [facility], [address], [place], [place] [address]    Report Status PENDING  Incomplete         Radiology Studies: CT HEAD WO CONTRAST ( ) Result Date: 10/18/2023 CLINICAL DATA:  89 year old male status post unwitnessed fall, posttraumatic intracranial hemorrhage. EXAM: CT HEAD WITHOUT CONTRAST TECHNIQUE: Contiguous axial images were obtained from the base of the skull through the vertex without intravenous contrast. RADIATION DOSE REDUCTION: This exam was performed according to the departmental dose-optimization program which includes automated exposure control, adjustment of the mA and/or kV according to patient size and/or use of iterative reconstruction technique. COMPARISON:  Head CT 0007 hours today and earlier. FINDINGS: Brain: Localized and mildly lobulated left anterior cranial fossa hemorrhage measuring up to 6 mm  on coronal image 23 appears stable to slightly regressed from earlier today. This is probably in the subdural space. Contralateral smaller more subtle hyperdense right side, right lateral convexity now is difficult to identify (series 2, image 20). No IVH. No new intracranial hemorrhage identified. Basilar cisterns remain normal. No midline shift, mass effect, or evidence of intracranial mass lesion. No ventriculomegaly. Patchy and confluent white matter hypodensity. Stable gray-white matter differentiation throughout the brain. Vascular: No suspicious intracranial vascular hyperdensity. Skull: Stable and intact.  No fracture identified. Sinuses/Orbits: Visualized paranasal sinuses and mastoids are stable and well aerated. Other: Broad-based left anterior convexity forehead and scalp hematoma. No scalp soft tissue gas. Underlying frontal bones and frontal sinuses appear intact. Negative orbits soft tissues. IMPRESSION: 1. Slightly regressed posttraumatic intracranial  hemorrhage since 0007 hours today: Lobulated left anterior frontal convexity probable subdural hematoma up to 6 mm. Trace and now subtle right side subdural hematoma. 2. No intracranial mass effect.  No new intracranial abnormality. 3. Left forehead scalp hematoma with no underlying skull fracture. Electronically Signed   By: VEAR Hurst M.D.   On: 10/18/2023 06:21   CT Head Wo Contrast Result Date: 10/18/2023 CLINICAL DATA:  Initial evaluation for acute head trauma. EXAM: CT HEAD WITHOUT CONTRAST TECHNIQUE: Contiguous axial images were obtained from the base of the skull through the vertex without intravenous contrast. RADIATION DOSE REDUCTION: This exam was performed according to the departmental dose-optimization program which includes automated exposure control, adjustment of the mA and/or kV according to patient size and/or use of iterative reconstruction technique. COMPARISON:  Prior CT from 10/15/2023. FINDINGS: Brain: Cerebral volume within normal limits for age. Mild-to-moderate chronic microvascular ischemic disease. Acute extra-axial hemorrhage overlying the anterior left frontal convexity is seen, measuring up to 8 mm in maximal thickness (series 2, image 24). No significant mass effect or midline shift. This is likely subdural in location. Additional trace extra-axial hemorrhage overlying the right temporal lobe measures up to 2 mm in thickness without significant mass effect (series 4, image 41). This is also likely subdural in location. No other acute intracranial hemorrhage. No acute large vessel territory infarct. No mass lesion or midline shift. No hydrocephalus. Vascular: No abnormal hyperdense vessel. Scattered vascular calcifications noted within the carotid siphons. Skull: Soft tissue contusion at the left frontal scalp. Calvarium intact without fracture. Sinuses/Orbits: Globes and orbital soft tissues demonstrate no acute finding. Mild mucosal thickening present about the ethmoidal air cells  and maxillary sinuses. Mastoid air cells are clear. Other: None. IMPRESSION: 1. Acute extra-axial hemorrhage overlying the anterior left frontal convexity, measuring up to 8 mm in maximal thickness. No significant mass effect or midline shift. This is likely subdural in location. 2. Additional trace extra-axial hemorrhage overlying the right temporal lobe measuring up to 2 mm in thickness without significant mass effect. This is also likely subdural in nature. 3. Soft tissue contusion at the left frontal scalp. No calvarial fracture. 4. Mild-to-moderate chronic microvascular ischemic disease. Critical Value/emergent results were called by telephone at the time of interpretation on 10/18/2023 at 12:33 am to provider DAVID [facility] , who verbally acknowledged these results. Electronically Signed   By: Morene Hoard M.D.   On: 10/18/2023 00:37   CT Cervical Spine Wo Contrast Result Date: 10/18/2023 CLINICAL DATA:  Trauma/fall EXAM: CT CERVICAL SPINE WITHOUT CONTRAST TECHNIQUE: Multidetector CT imaging of the cervical spine was performed without intravenous contrast. Multiplanar CT image reconstructions were also generated. RADIATION DOSE REDUCTION: This exam was performed according to the departmental dose-optimization  program which includes automated exposure control, adjustment of the mA and/or kV according to patient size and/or use of iterative reconstruction technique. COMPARISON:  10/15/2023 FINDINGS: Alignment: Normal cervical lordosis. Skull base and vertebrae: No acute fracture. No primary bone lesion or focal pathologic process. Soft tissues and spinal canal: No prevertebral fluid or swelling. No visible canal hematoma. Disc levels: Mild degenerative changes of the mid/lower cervical spine. Spinal canal is patent. Upper chest: Visualized lung apices are clear. Other: Visualized thyroid  is unremarkable. IMPRESSION: No traumatic injury to the cervical spine. Mild degenerative changes. Electronically  Signed   By: Pinkie Pebbles M.D.   On: 10/18/2023 00:32        Scheduled Meds:  amLODipine   5 mg Oral Daily   aspirin   81 mg Oral QHS   losartan   100 mg Oral Daily   And   hydrochlorothiazide   25 mg Oral Daily   pantoprazole   40 mg Oral Daily   simvastatin   20 mg Oral QHS     LOS: 0 days    Time spent: 35 minutes    Troy Esbenshade JONETTA Fairly, DO [facility]  If 7PM-7AM, please contact night-coverage [email or web address] 10/18/2023, 2:41 PM

## 2023-10-18 NOTE — Evaluation (Signed)
 Physical Therapy Evaluation Patient Details Name: Troy Thompson MRN: [id number] DOB: [birth year] Today's Date: 10/18/2023  History of Present Illness  Pt had a fall, hit his head and has positive subdural hematoma.  CT shows it is not progressing pt lives at a facility at this time.    Clinical Impression  PT will benefit from [facility] PT to improve balance  to reduce risk of falling        If plan is discharge home, recommend the following: A little help with walking and/or transfers;Assistance with cooking/housework;Help with stairs or ramp for entrance   Can travel by private vehicle    yes    Equipment Recommendations None recommended by PT  Recommendations for Other Services    none   Functional Status Assessment Patient has had a recent decline in their functional status and demonstrates the ability to make significant improvements in function in a reasonable and predictable amount of time.     Precautions / Restrictions Precautions Precautions: Fall Restrictions Weight Bearing Restrictions Per Provider Order: No      Mobility  Bed Mobility Overal bed mobility: Needs Assistance Bed Mobility: Supine to Sit, Sit to Supine     Supine to sit: Contact guard Sit to supine: Contact guard assist        Transfers Overall transfer level: Needs assistance Equipment used: Rolling walker (2 wheels) Transfers: Sit to/from Stand Sit to Stand: Contact guard assist                Ambulation/Gait Ambulation/Gait assistance: Modified independent (Device/Increase time) Gait Distance (Feet): 200 Feet Assistive device: Rolling walker (2 wheels) Gait Pattern/deviations: WFL(Within Functional Limits)   Gait velocity interpretation: >2.62 ft/sec, indicative of community ambulatory         Balance    Standing fair Dynamic standing fair                                         Pertinent Vitals/Pain Pain Assessment Pain Assessment: No/denies pain     Home Living Family/patient expects to be discharged to:: Assisted living                 Home Equipment: Agricultural [employer]ant (2 wheels)      Prior Function Prior Level of Function : Needs assist;History of Falls (last six months)             Mobility Comments: normmally uses a cane but has been falling with this.  Has RW at home not at the facility       Extremity/Trunk Assessment        Lower Extremity Assessment Lower Extremity Assessment: Overall WFL for tasks assessed       Communication   Communication Communication: No apparent difficulties  Cognition Arousal: Alert Behavior During Therapy: WFL for tasks assessed/performed Overall Cognitive Status: Within Functional Limits for tasks assessed                                                 Assessment/Plan    PT Assessment All further PT needs can be met in the next venue of care  PT Problem List Decreased balance;Decreased strength       PT Treatment Interventions      PT Goals (Current goals  can be found in the Care Plan section)  Acute Rehab PT Goals Patient Stated Goal: To go back to the facility PT Goal Formulation: With patient Time For Goal Achievement: 10/20/23 Potential to Achieve Goals: Good            AM-PAC PT 6 Clicks Mobility  Outcome Measure Help needed turning from your back to your side while in a flat bed without using bedrails?: None Help needed moving from lying on your back to sitting on the side of a flat bed without using bedrails?: A Little Help needed moving to and from a bed to a chair (including a wheelchair)?: A Little Help needed standing up from a chair using your arms (e.g., wheelchair or bedside chair)?: A Little Help needed to walk in hospital room?: A Little Help needed climbing 3-5 steps with a railing? : A Lot 6 Click Score: 18    End of Session Equipment Utilized During Treatment: Gait belt Activity Tolerance: Patient tolerated  treatment well Patient left: in bed;with chair alarm set;with nursing/sitter in room Nurse Communication: Mobility status PT Visit Diagnosis: Repeated falls (R29.6)    Time: 8644-8591 PT Time Calculation (min) (ACUTE ONLY): 13 min   Charges:       PT General Charges $$ ACUTE PT VISIT: 1 Visit         Montie Metro, PT CLT [phone]  10/18/2023, 2:17 PM

## 2023-10-18 NOTE — ED Notes (Signed)
 Pt ate 75% breakfast. Tolerated well. Gown placed on patient, soiled shirt placed into belongings bag. HOB lowered for patient to rest. Denies further needs. Lights dimmed. No S/S of distress noted. Call light within reach.

## 2023-10-18 NOTE — ED Notes (Signed)
 Pt son, Dorinda Hill, given update at this time.

## 2023-10-18 NOTE — ED Notes (Addendum)
 ED TO INPATIENT HANDOFF REPORT  ED Nurse Name and Phone #: Lavanda [phone]  S Name/Age/Gender Troy Thompson 89 y.o. male Room/Bed: APA02/APA02  Code Status   Code Status: Full Code  Home/SNF/Other Nursing Home Patient oriented to: self Is this baseline? Yes   Triage Complete: Triage complete  Chief Complaint Subdural hematoma (HCC) [S06.5XAA]  Triage Note Pt bib EMS from [facility] after he fell out of bed hitting his head on corner of a dresser. Pt with an abrasion and hematoma to center of forehead. Dr Raford at bedside.   Allergies No Known Allergies  Level of Care/Admitting Diagnosis ED Disposition     ED Disposition  Admit   Condition  --   Comment  Hospital Area: [facility] [[id number]]  Level of Care: Med-Surg [16]  Covid Evaluation: Asymptomatic - no recent exposure (last 10 days) testing not required  Diagnosis: Subdural hematoma [facility]) [[id number]]  Admitting Physician: ADEFESO, OLADAPO [[id number]]  Attending Physician: ADEFESO, OLADAPO [[id number]]  Certification:: I certify this patient will need inpatient services for at least 2 midnights  Expected Medical Readiness: Oct 22, 2023          B Medical/Surgery History Past Medical History:  Diagnosis Date   Carotid artery occlusion    Dementia (HCC)    Hypertension    Pneumonia    Retinal embolus, right 12/30/2007   Sudden onset of blindness    Stroke [facility])    Past Surgical History:  Procedure Laterality Date   CAROTID ENDARTERECTOMY  01/26/2008   Right    CHOLECYSTECTOMY N/A 01/17/2014   Procedure: LAPAROSCOPIC CHOLECYSTECTOMY;  Surgeon: Oneil DELENA Budge, MD;  Location: [facility];  Service: General;  Laterality: N/A;   COLONOSCOPY  03/15/2008   MFM:Wnmfjo rectum/Left-sided transverse diverticula/Diminutive cecal polyp status post cold biopsy(Tubular adenoma)   COLONOSCOPY  09/24/2002   MFM:Wnmfjo rectum/Left-sided diverticulum/Polyp at the splenic flexure removed     HERNIA REPAIR     JOINT REPLACEMENT Right 2005   knee   PROSTATE SURGERY     30 yrs ago   TRANSURETHRAL RESECTION OF PROSTATE       A IV Location/Drains/Wounds Patient Lines/Drains/Airways Status     Active Line/Drains/Airways     Name Placement date Placement time Site Days   Peripheral IV 10/18/23 20 G Anterior;Right Forearm 10/18/23  0112  Forearm  less than 1   Incision - 4 Ports Abdomen 1: Umbilicus 2: Mid;Upper 3: Right;Upper 4: Right;Lower 01/17/14  1155  -- 3561            Intake/Output Last 24 hours No intake or output data in the 24 hours ending 10/18/23 1233  Labs/Imaging Results for orders placed or performed during the hospital encounter of 10/17/23 (from the past 48 hours)  Urinalysis, w/ Reflex to Culture (Infection Suspected) -Urine, Clean Catch     Status: Abnormal   Collection Time: 10/17/23 11:41 PM  Result Value Ref Range   Specimen Source URINE, CLEAN CATCH    Color, Urine YELLOW YELLOW   APPearance HAZY (A) CLEAR   Specific Gravity, Urine 1.016 1.005 - 1.030   pH 5.0 5.0 - 8.0   Glucose, UA NEGATIVE NEGATIVE mg/dL   Hgb urine dipstick NEGATIVE NEGATIVE   Bilirubin Urine NEGATIVE NEGATIVE   Ketones, ur NEGATIVE NEGATIVE mg/dL   Protein, ur 899 (A) NEGATIVE mg/dL   Nitrite NEGATIVE NEGATIVE   Leukocytes,Ua NEGATIVE NEGATIVE   RBC / HPF 0-5 0 - 5 RBC/hpf   Bacteria, UA RARE (A)  NONE SEEN   Squamous Epithelial / HPF 0-5 0 - 5 /HPF   Mucus PRESENT    Hyaline Casts, UA PRESENT     Comment: Performed at [facility], [address] [place], [place] [address]  Comprehensive metabolic panel     Status: Abnormal   Collection Time: 10/17/23 11:55 PM  Result Value Ref Range   Sodium 133 (L) 135 - 145 mmol/L   Potassium 2.8 (L) 3.5 - 5.1 mmol/L   Chloride 102 98 - 111 mmol/L   CO2 22 22 - 32 mmol/L   Glucose, Bld 121 (H) 70 - 99 mg/dL    Comment: Glucose reference range applies only to samples taken after fasting for at least 8 hours.   BUN 28 (H) 8  - 23 mg/dL   Creatinine, Ser 8.70 (H) 0.61 - 1.24 mg/dL   Calcium 7.6 (L) 8.9 - 10.3 mg/dL   Total Protein 5.8 (L) 6.5 - 8.1 g/dL   Albumin 2.8 (L) 3.5 - 5.0 g/dL   AST 21 15 - 41 U/L   ALT 17 0 - 44 U/L   Alkaline Phosphatase 57 38 - 126 U/L   Total Bilirubin 0.8 0.0 - 1.2 mg/dL   GFR, Estimated 51 (L) >60 mL/min    Comment: (NOTE) Calculated using the CKD-EPI Creatinine Equation (2021)    Anion gap 9 5 - 15    Comment: Performed at [facility], [address], [place], [place] [address]  CBC with Differential     Status: Abnormal   Collection Time: 10/17/23 11:55 PM  Result Value Ref Range   WBC 7.9 4.0 - 10.5 K/uL   RBC 2.47 (L) 4.22 - 5.81 MIL/uL   Hemoglobin 7.9 (L) 13.0 - 17.0 g/dL   HCT 75.6 (L) 60.9 - 47.9 %   MCV 98.4 80.0 - 100.0 fL   MCH 32.0 26.0 - 34.0 pg   MCHC 32.5 30.0 - 36.0 g/dL   RDW 85.2 88.4 - 84.4 %   Platelets 113 (L) 150 - 400 K/uL   nRBC 0.0 0.0 - 0.2 %   Neutrophils Relative % 62 %   Neutro Abs 5.0 1.7 - 7.7 K/uL   Lymphocytes Relative 12 %   Lymphs Abs 1.0 0.7 - 4.0 K/uL   Monocytes Relative 24 %   Monocytes Absolute 1.9 (H) 0.1 - 1.0 K/uL   Eosinophils Relative 1 %   Eosinophils Absolute 0.1 0.0 - 0.5 K/uL   Basophils Relative 0 %   Basophils Absolute 0.0 0.0 - 0.1 K/uL   Immature Granulocytes 1 %   Abs Immature Granulocytes 0.06 0.00 - 0.07 K/uL    Comment: Performed at [facility], [address], [place], [place] [address]  Magnesium      Status: Abnormal   Collection Time: 10/17/23 11:55 PM  Result Value Ref Range   Magnesium  1.4 (L) 1.7 - 2.4 mg/dL    Comment: Performed at [facility], [address], [place], [place] [address]  POC occult blood, ED Provider will collect     Status: None   Collection Time: 10/18/23  1:20 AM  Result Value Ref Range   Fecal Occult Blood Negative   Comprehensive metabolic panel     Status: Abnormal   Collection Time: 10/18/23  5:01 AM  Result Value Ref Range   Sodium 134 (L) 135 - 145 mmol/L    Potassium 3.3 (L) 3.5 - 5.1 mmol/L   Chloride 103 98 - 111 mmol/L   CO2 22 22 - 32  mmol/L   Glucose, Bld 105 (H) 70 - 99 mg/dL    Comment: Glucose reference range applies only to samples taken after fasting for at least 8 hours.   BUN 24 (H) 8 - 23 mg/dL   Creatinine, Ser 8.87 0.61 - 1.24 mg/dL   Calcium 7.7 (L) 8.9 - 10.3 mg/dL   Total Protein 5.7 (L) 6.5 - 8.1 g/dL   Albumin 2.8 (L) 3.5 - 5.0 g/dL   AST 19 15 - 41 U/L   ALT 15 0 - 44 U/L   Alkaline Phosphatase 57 38 - 126 U/L   Total Bilirubin 0.8 0.0 - 1.2 mg/dL   GFR, Estimated >39 >39 mL/min    Comment: (NOTE) Calculated using the CKD-EPI Creatinine Equation (2021)    Anion gap 9 5 - 15    Comment: Performed at [facility], [address] [place], [place] [address]  CBC     Status: Abnormal   Collection Time: 10/18/23  5:01 AM  Result Value Ref Range   WBC 6.5 4.0 - 10.5 K/uL   RBC 2.47 (L) 4.22 - 5.81 MIL/uL   Hemoglobin 7.7 (L) 13.0 - 17.0 g/dL   HCT 75.1 (L) 60.9 - 47.9 %   MCV 100.4 (H) 80.0 - 100.0 fL   MCH 31.2 26.0 - 34.0 pg   MCHC 31.0 30.0 - 36.0 g/dL   RDW 85.3 88.4 - 84.4 %   Platelets 108 (L) 150 - 400 K/uL   nRBC 0.0 0.0 - 0.2 %    Comment: Performed at [facility], [address] [place], [place] [address]  Magnesium      Status: None   Collection Time: 10/18/23  5:01 AM  Result Value Ref Range   Magnesium  1.9 1.7 - 2.4 mg/dL    Comment: Performed at [facility], [address] [place], [place] [address]  Phosphorus     Status: None   Collection Time: 10/18/23  5:01 AM  Result Value Ref Range   Phosphorus 2.8 2.5 - 4.6 mg/dL    Comment: Performed at [facility], [address], [place], [place] [address]   CT HEAD WO CONTRAST ( ) Result Date: 10/18/2023 CLINICAL DATA:  89 year old male status post unwitnessed fall, posttraumatic intracranial hemorrhage. EXAM: CT HEAD WITHOUT CONTRAST TECHNIQUE: Contiguous axial images were obtained from the base of the skull through the vertex without intravenous  contrast. RADIATION DOSE REDUCTION: This exam was performed according to the departmental dose-optimization program which includes automated exposure control, adjustment of the mA and/or kV according to patient size and/or use of iterative reconstruction technique. COMPARISON:  Head CT 0007 hours today and earlier. FINDINGS: Brain: Localized and mildly lobulated left anterior cranial fossa hemorrhage measuring up to 6 mm on coronal image 23 appears stable to slightly regressed from earlier today. This is probably in the subdural space. Contralateral smaller more subtle hyperdense right side, right lateral convexity now is difficult to identify (series 2, image 20). No IVH. No new intracranial hemorrhage identified. Basilar cisterns remain normal. No midline shift, mass effect, or evidence of intracranial mass lesion. No ventriculomegaly. Patchy and confluent white matter hypodensity. Stable gray-white matter differentiation throughout the brain. Vascular: No suspicious intracranial vascular hyperdensity. Skull: Stable and intact.  No fracture identified. Sinuses/Orbits: Visualized paranasal sinuses and mastoids are stable and well aerated. Other: Broad-based left anterior convexity forehead and scalp hematoma. No scalp soft tissue gas. Underlying frontal bones and frontal sinuses appear intact. Negative orbits soft tissues. IMPRESSION: 1. Slightly regressed posttraumatic intracranial hemorrhage  since 0007 hours today: Lobulated left anterior frontal convexity probable subdural hematoma up to 6 mm. Trace and now subtle right side subdural hematoma. 2. No intracranial mass effect.  No new intracranial abnormality. 3. Left forehead scalp hematoma with no underlying skull fracture. Electronically Signed   By: VEAR Hurst M.D.   On: 10/18/2023 06:21   CT Head Wo Contrast Result Date: 10/18/2023 CLINICAL DATA:  Initial evaluation for acute head trauma. EXAM: CT HEAD WITHOUT CONTRAST TECHNIQUE: Contiguous axial images were  obtained from the base of the skull through the vertex without intravenous contrast. RADIATION DOSE REDUCTION: This exam was performed according to the departmental dose-optimization program which includes automated exposure control, adjustment of the mA and/or kV according to patient size and/or use of iterative reconstruction technique. COMPARISON:  Prior CT from 10/15/2023. FINDINGS: Brain: Cerebral volume within normal limits for age. Mild-to-moderate chronic microvascular ischemic disease. Acute extra-axial hemorrhage overlying the anterior left frontal convexity is seen, measuring up to 8 mm in maximal thickness (series 2, image 24). No significant mass effect or midline shift. This is likely subdural in location. Additional trace extra-axial hemorrhage overlying the right temporal lobe measures up to 2 mm in thickness without significant mass effect (series 4, image 41). This is also likely subdural in location. No other acute intracranial hemorrhage. No acute large vessel territory infarct. No mass lesion or midline shift. No hydrocephalus. Vascular: No abnormal hyperdense vessel. Scattered vascular calcifications noted within the carotid siphons. Skull: Soft tissue contusion at the left frontal scalp. Calvarium intact without fracture. Sinuses/Orbits: Globes and orbital soft tissues demonstrate no acute finding. Mild mucosal thickening present about the ethmoidal air cells and maxillary sinuses. Mastoid air cells are clear. Other: None. IMPRESSION: 1. Acute extra-axial hemorrhage overlying the anterior left frontal convexity, measuring up to 8 mm in maximal thickness. No significant mass effect or midline shift. This is likely subdural in location. 2. Additional trace extra-axial hemorrhage overlying the right temporal lobe measuring up to 2 mm in thickness without significant mass effect. This is also likely subdural in nature. 3. Soft tissue contusion at the left frontal scalp. No calvarial fracture. 4.  Mild-to-moderate chronic microvascular ischemic disease. Critical Value/emergent results were called by telephone at the time of interpretation on 10/18/2023 at 12:33 am to provider DAVID [facility] , who verbally acknowledged these results. Electronically Signed   By: Morene Hoard M.D.   On: 10/18/2023 00:37   CT Cervical Spine Wo Contrast Result Date: 10/18/2023 CLINICAL DATA:  Trauma/fall EXAM: CT CERVICAL SPINE WITHOUT CONTRAST TECHNIQUE: Multidetector CT imaging of the cervical spine was performed without intravenous contrast. Multiplanar CT image reconstructions were also generated. RADIATION DOSE REDUCTION: This exam was performed according to the departmental dose-optimization program which includes automated exposure control, adjustment of the mA and/or kV according to patient size and/or use of iterative reconstruction technique. COMPARISON:  10/15/2023 FINDINGS: Alignment: Normal cervical lordosis. Skull base and vertebrae: No acute fracture. No primary bone lesion or focal pathologic process. Soft tissues and spinal canal: No prevertebral fluid or swelling. No visible canal hematoma. Disc levels: Mild degenerative changes of the mid/lower cervical spine. Spinal canal is patent. Upper chest: Visualized lung apices are clear. Other: Visualized thyroid  is unremarkable. IMPRESSION: No traumatic injury to the cervical spine. Mild degenerative changes. Electronically Signed   By: Pinkie Pebbles M.D.   On: 10/18/2023 00:32    Pending Labs Unresulted Labs (From admission, onward)    None       Vitals/Pain Today's Vitals  10/18/23 0500 10/18/23 0530 10/18/23 0819 10/18/23 0922  BP: (!) 159/64 (!) 159/57 (!) 186/92 (!) 159/47  Pulse: 89 83 99   Resp: (!) 27 (!) 21 20   Temp:   98.1 F (36.7 C)   TempSrc:   Oral   SpO2: 91% 94% 98%   Weight:      Height:      PainSc:   0-No pain     Isolation Precautions No active isolations  Medications Medications  acetaminophen  (TYLENOL )  tablet 650 mg (has no administration in time range)    Or  acetaminophen  (TYLENOL ) suppository 650 mg (has no administration in time range)  ondansetron  (ZOFRAN ) tablet 4 mg (has no administration in time range)    Or  ondansetron  (ZOFRAN ) injection 4 mg (has no administration in time range)  amLODipine  (NORVASC ) tablet 5 mg (5 mg Oral Given 10/18/23 0821)  simvastatin  (ZOCOR ) tablet 20 mg (has no administration in time range)  pantoprazole  (PROTONIX ) EC tablet 40 mg (40 mg Oral Given 10/18/23 0820)  aspirin  chewable tablet 81 mg (has no administration in time range)  hydrALAZINE  (APRESOLINE ) injection 10 mg (10 mg Intravenous Given 10/18/23 0829)  losartan  (COZAAR ) tablet 100 mg (100 mg Oral Given 10/18/23 0820)    And  hydrochlorothiazide  (HYDRODIURIL ) tablet 25 mg (25 mg Oral Given 10/18/23 0821)  potassium chloride  10 mEq in 100 mL IVPB (0 mEq Intravenous Stopped 10/18/23 0436)  potassium chloride  SA (KLOR-CON  M) CR tablet 40 mEq (40 mEq Oral Given 10/18/23 0113)  magnesium  sulfate IVPB 2 g 50 mL (0 g Intravenous Stopped 10/18/23 0330)  potassium chloride  SA (KLOR-CON  M) CR tablet 40 mEq (40 mEq Oral Given 10/18/23 0820)    Mobility walks with device     Focused Assessments     R Recommendations: See Admitting Provider Note  Report given to:   Additional Notes:  Hematoma in middle of forehad

## 2023-10-18 NOTE — Progress Notes (Addendum)
   10/18/23 1238  TOC Brief Assessment  Insurance and Status Reviewed  Patient has primary care physician Yes  Home environment has been reviewed From Home  Prior level of function: Independent/son POA  Social Drivers of Health Review SDOH reviewed no interventions necessary  Readmission risk has been reviewed Yes  Transition of care needs no transition of care needs at this time   Addendum: Pt lives at [facility] will assess, CSW called there and they indicate he can return after assessment not sooner than Monday.  CSW spoke with son POA and pt/family support PT recommendation for HHPT- [facility] accepts.  TOC to follow.

## 2023-10-18 NOTE — ED Notes (Signed)
 Attempted to call report, told that nurse would call ED nurse back

## 2023-10-18 NOTE — ED Notes (Signed)
 Pt assisted to stand at bedside to urinate and then assisted back to bed.

## 2023-10-18 NOTE — Progress Notes (Signed)
 Patient continued to move about and try and get up with no sense of redirection. Charge nurse notified Dr. Sherryll Burger. New orders. Meds given at this time. Tele sitter is in place and working. Bed alarm is on. Fall matts at bedside.

## 2023-10-18 NOTE — ED Notes (Signed)
 Pt assisted in standing on side of bed to use urinal.

## 2023-10-19 ENCOUNTER — Inpatient Hospital Stay (HOSPITAL_COMMUNITY): Payer: PPO

## 2023-10-19 DIAGNOSIS — S065XAA Traumatic subdural hemorrhage with loss of consciousness status unknown, initial encounter: Secondary | ICD-10-CM | POA: Diagnosis not present

## 2023-10-19 LAB — CBC
HCT: 24 % — ABNORMAL LOW (ref 39.0–52.0)
Hemoglobin: 7.5 g/dL — ABNORMAL LOW (ref 13.0–17.0)
MCH: 31.3 pg (ref 26.0–34.0)
MCHC: 31.3 g/dL (ref 30.0–36.0)
MCV: 100 fL (ref 80.0–100.0)
Platelets: 110 10*3/uL — ABNORMAL LOW (ref 150–400)
RBC: 2.4 MIL/uL — ABNORMAL LOW (ref 4.22–5.81)
RDW: 14.6 % (ref 11.5–15.5)
WBC: 6.5 10*3/uL (ref 4.0–10.5)
nRBC: 0 % (ref 0.0–0.2)

## 2023-10-19 LAB — IRON AND TIBC
Iron: 20 ug/dL — ABNORMAL LOW (ref 45–182)
Saturation Ratios: 9 % — ABNORMAL LOW (ref 17.9–39.5)
TIBC: 212 ug/dL — ABNORMAL LOW (ref 250–450)
UIBC: 192 ug/dL

## 2023-10-19 LAB — FERRITIN: Ferritin: 531 ng/mL — ABNORMAL HIGH (ref 24–336)

## 2023-10-19 LAB — RETICULOCYTES
Immature Retic Fract: 31.6 % — ABNORMAL HIGH (ref 2.3–15.9)
RBC.: 2.62 MIL/uL — ABNORMAL LOW (ref 4.22–5.81)
Retic Count, Absolute: 81.2 10*3/uL (ref 19.0–186.0)
Retic Ct Pct: 3.1 % (ref 0.4–3.1)

## 2023-10-19 LAB — BASIC METABOLIC PANEL
Anion gap: 7 (ref 5–15)
BUN: 30 mg/dL — ABNORMAL HIGH (ref 8–23)
CO2: 23 mmol/L (ref 22–32)
Calcium: 7.9 mg/dL — ABNORMAL LOW (ref 8.9–10.3)
Chloride: 106 mmol/L (ref 98–111)
Creatinine, Ser: 1.86 mg/dL — ABNORMAL HIGH (ref 0.61–1.24)
GFR, Estimated: 33 mL/min — ABNORMAL LOW (ref >60)
Glucose, Bld: 102 mg/dL — ABNORMAL HIGH (ref 70–99)
Potassium: 3.8 mmol/L (ref 3.5–5.1)
Sodium: 136 mmol/L (ref 135–145)

## 2023-10-19 LAB — MAGNESIUM: Magnesium: 1.8 mg/dL (ref 1.7–2.4)

## 2023-10-19 LAB — VITAMIN B12: Vitamin B-12: 678 pg/mL (ref 180–914)

## 2023-10-19 LAB — FOLATE: Folate: 16 ng/mL (ref >5.9)

## 2023-10-19 MED ORDER — LACTATED RINGERS IV SOLN: INTRAVENOUS | Status: AC

## 2023-10-19 NOTE — Plan of Care (Signed)
   Problem: Health Behavior/Discharge Planning: Goal: Ability to manage health-related needs will improve Outcome: Not Progressing

## 2023-10-19 NOTE — Progress Notes (Signed)
 PROGRESS NOTE    Troy Thompson  FMW:[id number] DOB: 1925/08/31 DOA: 10/17/2023 PCP: Sheryle Carwin, MD   Brief Narrative:    Troy Thompson is a 89 y.o. male with medical history significant of HTN, HLD, GERD, prior stroke who presents to the emergency department [facility] via EMS after sustaining a fall and hitting his head on a corner of a dresser.  Patient was unable to provide history of his falls possibly due to an underlying dementia.  Patient was noted to have subdural hematoma that appears stable on imaging.  He has been assessed by PT with need for home health services.  Patient now has worsening mentation and is having delirium.  Assessment & Plan:   Principal Problem:   Subdural hematoma (HCC) Active Problems:   Multiple falls   Hypokalemia   Hypomagnesemia   Normocytic anemia   Essential hypertension   Mixed hyperlipidemia   GERD (gastroesophageal reflux disease)   History of stroke   Left wrist pain  Assessment and Plan:  Subdural hematoma in the setting of multiple falls CT head without contrast was suggestive of subdural hematoma Neurosurgeon on-call (Dr. Gillie, Rockey) was consulted and recommended repeating CT scan in 6 hours and repeat CT appears stable  Continue fall precaution Evaluated by PT with recommendations for home health services  Acute delirium Likely in the setting of above with associated dementia Continue Haldol  and Ativan  as needed Telesitter Palliative consultation due to overall poor prognosis   Left wrist pain due to multiple falls Left wrist swelling and immobilized with a brace Continue fall precaution Continue Tylenol  as needed Continue PT/OT eval and treat   Normocytic anemia H/H= 7.9/24.3, this was 9.3/29.5 on 1/08 Hemoccult was negative Noted to have iron deficiency Continue to monitor H/H with morning labs   Hypertensive urgency-improved Essential hypertension Continue IV hydralazine   10 mg every 6 hours as needed for SBP > 170 Continue amlodipine , Hyzaar   Mixed hyperlipidemia Continue Zocor    GERD Continue Protonix    History of stroke Continue aspirin , Zocor    DVT prophylaxis: SCDs Code Status: DNR Family Communication: Discussed with son on phone 1/12 Disposition Plan:  Status is: Inpatient Remains inpatient appropriate because: Need for close monitoring.   Consultants:  Discussed with neurosurgery  Procedures:  None  Antimicrobials:  None   Subjective: Patient seen and evaluated today with ongoing confusion and mild agitation.  Noted to be quite agitated overnight despite use of Haldol  and Ativan .  Objective: Vitals:   10/18/23 1253 10/18/23 1400 10/18/23 2200 10/19/23 1035  BP: (!) 152/60 (!) 190/62 (!) 172/68 (!) 183/69  Pulse: 96 99 85 90  Resp:  20    Temp: 97.9 F (36.6 C)  98.7 F (37.1 C)   TempSrc: Oral  Axillary   SpO2: 97% 96% 96% 96%  Weight:      Height:        Intake/Output Summary (Last 24 hours) at 10/19/2023 1130 Last data filed at 10/19/2023 0659 Gross per 24 hour  Intake 480 ml  Output --  Net 480 ml   Filed Weights   10/17/23 2341  Weight: 83.9 kg    Examination:  General exam: Appears agitated and uncomfortable Respiratory system: Clear to auscultation. Respiratory effort normal. Cardiovascular system: S1 & S2 heard, RRR.  Gastrointestinal system: Abdomen is soft Central nervous system: Unresponsive to questioning, agitated Extremities: No edema Skin: No significant lesions noted    Data Reviewed: I have personally reviewed following labs  and imaging studies  CBC: Recent Labs  Lab 10/15/23 1511 10/17/23 2355 10/18/23 0501 10/19/23 0442  WBC 12.5* 7.9 6.5 6.5  NEUTROABS 8.8* 5.0  --   --   HGB 9.3* 7.9* 7.7* 7.5*  HCT 29.5* 24.3* 24.8* 24.0*  MCV 98.0 98.4 100.4* 100.0  PLT 128* 113* 108* 110*   Basic Metabolic Panel: Recent Labs  Lab 10/15/23 1511 10/17/23 2355 10/18/23 0501  10/19/23 0442  NA 135 133* 134* 136  K 3.2* 2.8* 3.3* 3.8  CL 100 102 103 106  CO2 24 22 22 23   GLUCOSE 163* 121* 105* 102*  BUN 18 28* 24* 30*  CREATININE 1.09 1.29* 1.12 1.86*  CALCIUM 8.2* 7.6* 7.7* 7.9*  MG  --  1.4* 1.9 1.8  PHOS  --   --  2.8  --    GFR: Estimated Creatinine Clearance: 24.1 mL/min (A) (by C-G formula based on SCr of 1.86 mg/dL (H)). Liver Function Tests: Recent Labs  Lab 10/15/23 1511 10/17/23 2355 10/18/23 0501  AST 36 21 19  ALT 34 17 15  ALKPHOS 78 57 57  BILITOT 0.8 0.8 0.8  PROT 6.7 5.8* 5.7*  ALBUMIN 3.4* 2.8* 2.8*   No results for input(s): LIPASE, AMYLASE in the last 168 hours. No results for input(s): AMMONIA in the last 168 hours. Coagulation Profile: Recent Labs  Lab 10/15/23 1511  INR 1.2   Cardiac Enzymes: No results for input(s): CKTOTAL, CKMB, CKMBINDEX, TROPONINI in the last 168 hours. BNP (last 3 results) No results for input(s): PROBNP in the last 8760 hours. HbA1C: No results for input(s): HGBA1C in the last 72 hours. CBG: No results for input(s): GLUCAP in the last 168 hours. Lipid Profile: No results for input(s): CHOL, HDL, LDLCALC, TRIG, CHOLHDL, LDLDIRECT in the last 72 hours. Thyroid  Function Tests: No results for input(s): TSH, T4TOTAL, FREET4, T3FREE, THYROIDAB in the last 72 hours. Anemia Panel: Recent Labs    10/19/23 0955  VITAMINB12 678  FOLATE 16.0  FERRITIN 531*  TIBC 212*  IRON 20*  RETICCTPCT 3.1   Sepsis Labs: Recent Labs  Lab 10/15/23 1512  LATICACIDVEN 1.8    Recent Results (from the past 240 hours)  Culture, blood (Routine x 2)     Status: None (Preliminary result)   Collection Time: 10/15/23  3:11 PM   Specimen: BLOOD  Result Value Ref Range Status   Specimen Description BLOOD RIGHT ASSIST CONTROL  Final   Special Requests   Final    BOTTLES DRAWN AEROBIC AND ANAEROBIC Blood Culture adequate volume   Culture   Final    NO GROWTH 4  DAYS Performed at [facility], [address], [place], [place] [address]    Report Status PENDING  Incomplete  Culture, blood (Routine x 2)     Status: None (Preliminary result)   Collection Time: 10/15/23  3:59 PM   Specimen: BLOOD RIGHT FOREARM  Result Value Ref Range Status   Specimen Description BLOOD RIGHT FOREARM  Final   Special Requests   Final    BOTTLES DRAWN AEROBIC AND ANAEROBIC Blood Culture adequate volume   Culture   Final    NO GROWTH 4 DAYS Performed at [facility], [address], [place], [place] [address]    Report Status PENDING  Incomplete         Radiology Studies: CT HEAD WO CONTRAST ( ) Result Date: 10/19/2023 CLINICAL DATA:  Mental status change, unknown cause. Subdermal hematoma follow-up. EXAM: CT HEAD WITHOUT CONTRAST TECHNIQUE: Contiguous  axial images were obtained from the base of the skull through the vertex without intravenous contrast. RADIATION DOSE REDUCTION: This exam was performed according to the departmental dose-optimization program which includes automated exposure control, adjustment of the mA and/or kV according to patient size and/or use of iterative reconstruction technique. COMPARISON:  CT head without contrast FINDINGS: Brain: The extra-axial hemorrhage adjacent to the left frontal lobe is stable in size from most recent exam, measuring 8 mm maximally on coronal images. No new hemorrhage is present. Minimal local mass effect without scratched at minimal local mass effect is present without midline shift. Moderate generalized atrophy and white matter disease is stable. No acute infarct or mass lesion is present. The ventricles are proportionate to the degree of atrophy. No other significant extra-axial hemorrhage is present. The brainstem and cerebellum are within normal limits. Midline structures are within normal limits. Vascular: Atherosclerotic calcifications are present within the cavernous internal carotid arteries bilaterally. No  aneurysm is present. Skull: Calvarium is intact. No focal lytic or blastic lesions are present. No significant extracranial soft tissue lesion is present. Sinuses/Orbits: The paranasal sinuses and mastoid air cells are clear. Bilateral lens replacements are noted. Globes and orbits are otherwise unremarkable. IMPRESSION: 1. Stable 8 mm extra-axial hemorrhage adjacent to the left frontal lobe. 2. Minimal local mass effect without midline shift. 3. Stable atrophy and white matter disease. This likely reflects the sequela of chronic microvascular ischemia. Electronically Signed   By: Lonni Necessary M.D.   On: 10/19/2023 10:34   CT HEAD WO CONTRAST ( ) Result Date: 10/18/2023 CLINICAL DATA:  89 year old male status post unwitnessed fall, posttraumatic intracranial hemorrhage. EXAM: CT HEAD WITHOUT CONTRAST TECHNIQUE: Contiguous axial images were obtained from the base of the skull through the vertex without intravenous contrast. RADIATION DOSE REDUCTION: This exam was performed according to the departmental dose-optimization program which includes automated exposure control, adjustment of the mA and/or kV according to patient size and/or use of iterative reconstruction technique. COMPARISON:  Head CT 0007 hours today and earlier. FINDINGS: Brain: Localized and mildly lobulated left anterior cranial fossa hemorrhage measuring up to 6 mm on coronal image 23 appears stable to slightly regressed from earlier today. This is probably in the subdural space. Contralateral smaller more subtle hyperdense right side, right lateral convexity now is difficult to identify (series 2, image 20). No IVH. No new intracranial hemorrhage identified. Basilar cisterns remain normal. No midline shift, mass effect, or evidence of intracranial mass lesion. No ventriculomegaly. Patchy and confluent white matter hypodensity. Stable gray-white matter differentiation throughout the brain. Vascular: No suspicious intracranial vascular  hyperdensity. Skull: Stable and intact.  No fracture identified. Sinuses/Orbits: Visualized paranasal sinuses and mastoids are stable and well aerated. Other: Broad-based left anterior convexity forehead and scalp hematoma. No scalp soft tissue gas. Underlying frontal bones and frontal sinuses appear intact. Negative orbits soft tissues. IMPRESSION: 1. Slightly regressed posttraumatic intracranial hemorrhage since 0007 hours today: Lobulated left anterior frontal convexity probable subdural hematoma up to 6 mm. Trace and now subtle right side subdural hematoma. 2. No intracranial mass effect.  No new intracranial abnormality. 3. Left forehead scalp hematoma with no underlying skull fracture. Electronically Signed   By: VEAR Hurst M.D.   On: 10/18/2023 06:21   CT Head Wo Contrast Result Date: 10/18/2023 CLINICAL DATA:  Initial evaluation for acute head trauma. EXAM: CT HEAD WITHOUT CONTRAST TECHNIQUE: Contiguous axial images were obtained from the base of the skull through the vertex without intravenous contrast. RADIATION DOSE REDUCTION: This  exam was performed according to the departmental dose-optimization program which includes automated exposure control, adjustment of the mA and/or kV according to patient size and/or use of iterative reconstruction technique. COMPARISON:  Prior CT from 10/15/2023. FINDINGS: Brain: Cerebral volume within normal limits for age. Mild-to-moderate chronic microvascular ischemic disease. Acute extra-axial hemorrhage overlying the anterior left frontal convexity is seen, measuring up to 8 mm in maximal thickness (series 2, image 24). No significant mass effect or midline shift. This is likely subdural in location. Additional trace extra-axial hemorrhage overlying the right temporal lobe measures up to 2 mm in thickness without significant mass effect (series 4, image 41). This is also likely subdural in location. No other acute intracranial hemorrhage. No acute large vessel territory  infarct. No mass lesion or midline shift. No hydrocephalus. Vascular: No abnormal hyperdense vessel. Scattered vascular calcifications noted within the carotid siphons. Skull: Soft tissue contusion at the left frontal scalp. Calvarium intact without fracture. Sinuses/Orbits: Globes and orbital soft tissues demonstrate no acute finding. Mild mucosal thickening present about the ethmoidal air cells and maxillary sinuses. Mastoid air cells are clear. Other: None. IMPRESSION: 1. Acute extra-axial hemorrhage overlying the anterior left frontal convexity, measuring up to 8 mm in maximal thickness. No significant mass effect or midline shift. This is likely subdural in location. 2. Additional trace extra-axial hemorrhage overlying the right temporal lobe measuring up to 2 mm in thickness without significant mass effect. This is also likely subdural in nature. 3. Soft tissue contusion at the left frontal scalp. No calvarial fracture. 4. Mild-to-moderate chronic microvascular ischemic disease. Critical Value/emergent results were called by telephone at the time of interpretation on 10/18/2023 at 12:33 am to provider DAVID [facility] , who verbally acknowledged these results. Electronically Signed   By: Morene Hoard M.D.   On: 10/18/2023 00:37   CT Cervical Spine Wo Contrast Result Date: 10/18/2023 CLINICAL DATA:  Trauma/fall EXAM: CT CERVICAL SPINE WITHOUT CONTRAST TECHNIQUE: Multidetector CT imaging of the cervical spine was performed without intravenous contrast. Multiplanar CT image reconstructions were also generated. RADIATION DOSE REDUCTION: This exam was performed according to the departmental dose-optimization program which includes automated exposure control, adjustment of the mA and/or kV according to patient size and/or use of iterative reconstruction technique. COMPARISON:  10/15/2023 FINDINGS: Alignment: Normal cervical lordosis. Skull base and vertebrae: No acute fracture. No primary bone lesion or focal  pathologic process. Soft tissues and spinal canal: No prevertebral fluid or swelling. No visible canal hematoma. Disc levels: Mild degenerative changes of the mid/lower cervical spine. Spinal canal is patent. Upper chest: Visualized lung apices are clear. Other: Visualized thyroid  is unremarkable. IMPRESSION: No traumatic injury to the cervical spine. Mild degenerative changes. Electronically Signed   By: Pinkie Pebbles M.D.   On: 10/18/2023 00:32        Scheduled Meds:  amLODipine   5 mg Oral Daily   pantoprazole   40 mg Oral Daily   simvastatin   20 mg Oral QHS     LOS: 1 day    Time spent: 55 minutes    Yehia Mcbain JONETTA Fairly, DO [facility]  If 7PM-7AM, please contact night-coverage [email or web address] 10/19/2023, 11:30 AM

## 2023-10-19 NOTE — Plan of Care (Signed)

## 2023-10-20 ENCOUNTER — Encounter (HOSPITAL_COMMUNITY): Payer: Self-pay | Admitting: Internal Medicine

## 2023-10-20 DIAGNOSIS — S065XAA Traumatic subdural hemorrhage with loss of consciousness status unknown, initial encounter: Secondary | ICD-10-CM | POA: Diagnosis not present

## 2023-10-20 DIAGNOSIS — Z515 Encounter for palliative care: Secondary | ICD-10-CM | POA: Diagnosis not present

## 2023-10-20 DIAGNOSIS — Z7189 Other specified counseling: Secondary | ICD-10-CM

## 2023-10-20 LAB — BASIC METABOLIC PANEL
Anion gap: 10 (ref 5–15)
BUN: 24 mg/dL — ABNORMAL HIGH (ref 8–23)
CO2: 22 mmol/L (ref 22–32)
Calcium: 8.3 mg/dL — ABNORMAL LOW (ref 8.9–10.3)
Chloride: 103 mmol/L (ref 98–111)
Creatinine, Ser: 1.35 mg/dL — ABNORMAL HIGH (ref 0.61–1.24)
GFR, Estimated: 48 mL/min — ABNORMAL LOW (ref >60)
Glucose, Bld: 124 mg/dL — ABNORMAL HIGH (ref 70–99)
Potassium: 4 mmol/L (ref 3.5–5.1)
Sodium: 135 mmol/L (ref 135–145)

## 2023-10-20 LAB — CBC
HCT: 26.4 % — ABNORMAL LOW (ref 39.0–52.0)
Hemoglobin: 8.4 g/dL — ABNORMAL LOW (ref 13.0–17.0)
MCH: 32.2 pg (ref 26.0–34.0)
MCHC: 31.8 g/dL (ref 30.0–36.0)
MCV: 101.1 fL — ABNORMAL HIGH (ref 80.0–100.0)
Platelets: 151 10*3/uL (ref 150–400)
RBC: 2.61 MIL/uL — ABNORMAL LOW (ref 4.22–5.81)
RDW: 14.5 % (ref 11.5–15.5)
WBC: 9.5 10*3/uL (ref 4.0–10.5)
nRBC: 0 % (ref 0.0–0.2)

## 2023-10-20 LAB — CULTURE, BLOOD (ROUTINE X 2)
Culture: NO GROWTH
Culture: NO GROWTH
Special Requests: ADEQUATE
Special Requests: ADEQUATE

## 2023-10-20 LAB — MAGNESIUM: Magnesium: 1.6 mg/dL — ABNORMAL LOW (ref 1.7–2.4)

## 2023-10-20 MED ORDER — GLYCOPYRROLATE 0.2 MG/ML IJ SOLN
0.2000 mg | INTRAMUSCULAR | Status: DC | PRN
  Administered 2023-10-20: 0.2 mg via INTRAVENOUS
  Filled 2023-10-20: qty 1

## 2023-10-20 MED ORDER — DEXTROSE-SODIUM CHLORIDE 5-0.45 % IV SOLN: INTRAVENOUS | Status: DC

## 2023-10-20 MED ORDER — GLYCOPYRROLATE 1 MG PO TABS: 1.0000 mg | ORAL_TABLET | ORAL | Status: DC | PRN

## 2023-10-20 MED ORDER — HALOPERIDOL 0.5 MG PO TABS: 0.5000 mg | ORAL_TABLET | ORAL | Status: DC | PRN

## 2023-10-20 MED ORDER — BIOTENE DRY MOUTH MT LIQD: 15.0000 mL | OROMUCOSAL | Status: DC | PRN

## 2023-10-20 MED ORDER — POLYVINYL ALCOHOL 1.4 % OP SOLN: 1.0000 [drp] | Freq: Four times a day (QID) | OPHTHALMIC | Status: DC | PRN

## 2023-10-20 MED ORDER — GLYCOPYRROLATE 0.2 MG/ML IJ SOLN: 0.2000 mg | INTRAMUSCULAR | Status: DC | PRN

## 2023-10-20 MED ORDER — HALOPERIDOL LACTATE 5 MG/ML IJ SOLN: 2.0000 mg | Freq: Four times a day (QID) | INTRAMUSCULAR | Status: DC | PRN

## 2023-10-20 MED ORDER — HALOPERIDOL LACTATE 2 MG/ML PO CONC: 0.5000 mg | ORAL | Status: DC | PRN

## 2023-10-20 MED ORDER — MAGNESIUM SULFATE 2 GM/50ML IV SOLN
2.0000 g | Freq: Once | INTRAVENOUS | Status: AC
  Administered 2023-10-20: 2 g via INTRAVENOUS
  Filled 2023-10-20: qty 50

## 2023-10-20 MED ORDER — QUETIAPINE FUMARATE 25 MG PO TABS
25.0000 mg | ORAL_TABLET | Freq: Every day | ORAL | Status: DC
Start: 2023-10-20 — End: 2023-10-20

## 2023-10-20 MED ORDER — MORPHINE SULFATE (CONCENTRATE) 10 MG /0.5 ML PO SOLN
2.6000 mg | ORAL | Status: DC | PRN
  Administered 2023-10-20 – 2023-10-21 (×4): 5 mg via ORAL
  Administered 2023-10-21: 2.6 mg via ORAL
  Filled 2023-10-20 (×5): qty 0.5

## 2023-10-20 MED ORDER — ONDANSETRON 4 MG PO TBDP: 4.0000 mg | ORAL_TABLET | Freq: Four times a day (QID) | ORAL | Status: DC | PRN

## 2023-10-20 MED ORDER — HALOPERIDOL LACTATE 5 MG/ML IJ SOLN: 0.5000 mg | INTRAMUSCULAR | Status: DC | PRN

## 2023-10-20 MED ORDER — ONDANSETRON HCL 4 MG/2ML IJ SOLN: 4.0000 mg | Freq: Four times a day (QID) | INTRAMUSCULAR | Status: DC | PRN

## 2023-10-20 NOTE — TOC Initial Note (Addendum)
 Transition of Care [facility]) - Initial/Assessment Note    Patient Details  Name: Troy Thompson MRN: [id number] Date of Birth: 1924/11/18  [facility] CM/SW Contact:    Lucie Lunger, LCSWA Phone Number: 10/20/2023, 12:04 PM  Clinical Narrative:                 CSW noted per chart review that pt arrived the [facility]. CSW spoke to [place] with facility who states prior to return pt will need to be assessed by facility staff. CSW updated by MD that palliative has been consulted. TOC to follow.   Addendum 1pm: CSW updated that pts family is requesting residential hospice with [facility]. CSW spoke to [place] with [facility] who states they will review referral. They do not have a nurse available to assess pt today and possibly not tomorrow. CSW awaits update on when RN can come assess. TOC to follow.   Addendum 3:30pm: CSW updated by Lucie with [facility] that a hospice RN will be coming to the hospital tomorrow between 11:30-12 to complete assessment. TOC to follow.   Expected Discharge Plan: Assisted Living Barriers to Discharge: Continued Medical Work up   Patient Goals and CMS Choice Patient states their goals for this hospitalization and ongoing recovery are:: get better CMS Medicare.gov Compare Post Acute Care list provided to:: Patient Represenative (must comment) Choice offered to / list presented to : Adult Children      Expected Discharge Plan and Services In-house Referral: Clinical Social Work Discharge Planning Services: CM Consult   Living arrangements for the past 2 months: Assisted Living Facility                                      Prior Living Arrangements/Services Living arrangements for the past 2 months: Assisted Living Facility Lives with:: Facility Resident Patient language and need for interpreter reviewed:: Yes Do you feel safe going back to the place where you live?: Yes      Need for Family Participation in Patient Care: Yes  (Comment) Care giver support system in place?: Yes (comment)   Criminal Activity/Legal Involvement Pertinent to Current Situation/Hospitalization: No - Comment as needed  Activities of Daily Living   ADL Screening (condition at time of admission) Independently performs ADLs?: No Does the patient have a NEW difficulty with bathing/dressing/toileting/self-feeding that is expected to last >3 days?: No Does the patient have a NEW difficulty with getting in/out of bed, walking, or climbing stairs that is expected to last >3 days?: No Does the patient have a NEW difficulty with communication that is expected to last >3 days?: No Is the patient deaf or have difficulty hearing?: No Does the patient have difficulty seeing, even when wearing glasses/contacts?: Yes Does the patient have difficulty concentrating, remembering, or making decisions?: No  Permission Sought/Granted                  Emotional Assessment Appearance:: Appears stated age       Alcohol  / Substance Use: Not Applicable Psych Involvement: No (comment)  Admission diagnosis:  Hypomagnesemia [E83.42] Subdural hematoma (HCC) [S06.5XAA] Thrombocytopenia (HCC) [D69.6] Normochromic normocytic anemia [D64.9] Mobitz type 1 second degree AV block [I44.1] Diuretic-induced hypokalemia [E87.6, T50.2X5A] Fall at nursing home, initial encounter [W19.CHERENE, Y92.129] Patient Active Problem List   Diagnosis Date Noted   Subdural hematoma (HCC) 10/18/2023   Multiple falls 10/18/2023   Hypokalemia 10/18/2023  Hypomagnesemia 10/18/2023   Normocytic anemia 10/18/2023   Essential hypertension 10/18/2023   Mixed hyperlipidemia 10/18/2023   GERD (gastroesophageal reflux disease) 10/18/2023   History of stroke 10/18/2023   Left wrist pain 10/18/2023   MGUS (monoclonal gammopathy of unknown significance) 04/01/2023   Hx of adenomatous colonic polyps 08/18/2014   Macrocytic anemia 08/18/2014   FH: colon cancer 08/18/2014    Aftercare following surgery of the circulatory system, NEC 03/01/2014   Fever and chills 01/24/2014   Occlusion and stenosis of carotid artery without mention of cerebral infarction 02/25/2012   PCP:  Sheryle Carwin, MD Pharmacy:   [facility] - [place], [place] - [address] [place] [address] Phone: [phone] Fax: [phone]  [facility] - [place], [place] - [address] [place] [address] Phone: [phone] Fax: [phone]     Social Drivers of Health (SDOH) Social History: SDOH Screenings   Food Insecurity: No Food Insecurity (10/18/2023)  Housing: Low Risk  (10/18/2023)  Transportation Needs: No Transportation Needs (10/18/2023)  Utilities: Not At Risk (10/18/2023)  Depression (PHQ2-9): Low Risk  (10/08/2022)  Social Connections: Unknown (10/18/2023)  Tobacco Use: Medium Risk (10/17/2023)   SDOH Interventions:     Readmission Risk Interventions    10/20/2023   12:03 PM  Readmission Risk Prevention Plan  Transportation Screening Complete  Home Care Screening Complete  Medication Review (RN CM) Complete

## 2023-10-20 NOTE — Progress Notes (Signed)
 PROGRESS NOTE    Troy Thompson  FMW:[id number] DOB: [birth year] DOA: 10/17/2023 PCP: Sheryle Carwin, MD   Brief Narrative:    Troy Thompson is a 89 y.o. male with medical history significant of HTN, HLD, GERD, prior stroke who presents to the emergency department [facility] via EMS after sustaining a fall and hitting his head on a corner of a dresser.  Patient was unable to provide history of his falls possibly due to an underlying dementia.  Patient was noted to have subdural hematoma that appears stable on imaging.  He has been assessed by PT with need for home health services.  Patient now has worsening mentation and is having delirium.  Assessment & Plan:   Principal Problem:   Subdural hematoma (HCC) Active Problems:   Multiple falls   Hypokalemia   Hypomagnesemia   Normocytic anemia   Essential hypertension   Mixed hyperlipidemia   GERD (gastroesophageal reflux disease)   History of stroke   Left wrist pain  Assessment and Plan:  Subdural hematoma in the setting of multiple falls CT head without contrast was suggestive of subdural hematoma Neurosurgeon on-call (Dr. Gillie, Rockey) was consulted and recommended repeating CT scan in 6 hours and repeat CT appears stable  Continue fall precaution Evaluated by PT with recommendations for home health services  Acute delirium Likely in the setting of above with associated dementia Continue Haldol  and Ativan  as needed Telesitter Palliative consultation due to overall poor prognosis Start Seroquel  25 mg at bedtime 1/13  Hypomagnesemia Replete and reevaluate in a.m.  Mild AKI Continue IV fluid and continue monitoring Likely prerenal in the setting of poor oral intake   Left wrist pain due to multiple falls Left wrist swelling and immobilized with a brace Continue fall precaution Continue Tylenol  as needed Continue PT/OT eval and treat   Normocytic anemia-stable H/H= 7.9/24.3, this  was 9.3/29.5 on 1/08 Hemoccult was negative Noted to have iron deficiency Continue to monitor H/H with morning labs   Hypertensive urgency-improved Essential hypertension Continue IV hydralazine  10 mg every 6 hours as needed for SBP > 170 Continue amlodipine , Hyzaar   Mixed hyperlipidemia Continue Zocor    GERD Continue Protonix    History of stroke Continue aspirin , Zocor    DVT prophylaxis: SCDs Code Status: DNR Family Communication: Discussed with son on phone 1/12 Disposition Plan:  Status is: Inpatient Remains inpatient appropriate because: Need for close monitoring.   Consultants:  Discussed with neurosurgery  Procedures:  None  Antimicrobials:  None   Subjective: Patient seen and evaluated today with ongoing confusion and mild agitation.  Noted to be quite agitated overnight despite use of Haldol  and Ativan .  Palliative evaluation pending.  Objective: Vitals:   10/19/23 2000 10/19/23 2230 10/20/23 0600 10/20/23 0832  BP: (!) 164/92 125/79 (!) 156/54 (!) 179/61  Pulse: (!) 103 90 (!) 106 80  Resp: 18 (!) 24 (!) 28 (!) 24  Temp: 98.3 F (36.8 C) 98.1 F (36.7 C) 97.6 F (36.4 C)   TempSrc: Axillary  Axillary   SpO2: 92% 94% 95% 92%  Weight:      Height:        Intake/Output Summary (Last 24 hours) at 10/20/2023 0901 Last data filed at 10/19/2023 1751 Gross per 24 hour  Intake 993.96 ml  Output --  Net 993.96 ml   Filed Weights   10/17/23 2341  Weight: 83.9 kg    Examination:  General exam: Appears agitated and uncomfortable Respiratory system: Clear to  auscultation. Respiratory effort normal. Cardiovascular system: S1 & S2 heard, RRR.  Gastrointestinal system: Abdomen is soft Central nervous system: Unresponsive to questioning, agitated Extremities: No edema Skin: No significant lesions noted    Data Reviewed: I have personally reviewed following labs and imaging studies  CBC: Recent Labs  Lab 10/15/23 1511 10/17/23 2355  10/18/23 0501 10/19/23 0442 10/20/23 0421  WBC 12.5* 7.9 6.5 6.5 9.5  NEUTROABS 8.8* 5.0  --   --   --   HGB 9.3* 7.9* 7.7* 7.5* 8.4*  HCT 29.5* 24.3* 24.8* 24.0* 26.4*  MCV 98.0 98.4 100.4* 100.0 101.1*  PLT 128* 113* 108* 110* 151   Basic Metabolic Panel: Recent Labs  Lab 10/15/23 1511 10/17/23 2355 10/18/23 0501 10/19/23 0442 10/20/23 0421  NA 135 133* 134* 136 135  K 3.2* 2.8* 3.3* 3.8 4.0  CL 100 102 103 106 103  CO2 24 22 22 23 22   GLUCOSE 163* 121* 105* 102* 124*  BUN 18 28* 24* 30* 24*  CREATININE 1.09 1.29* 1.12 1.86* 1.35*  CALCIUM 8.2* 7.6* 7.7* 7.9* 8.3*  MG  --  1.4* 1.9 1.8 1.6*  PHOS  --   --  2.8  --   --    GFR: Estimated Creatinine Clearance: 33.1 mL/min (A) (by C-G formula based on SCr of 1.35 mg/dL (H)). Liver Function Tests: Recent Labs  Lab 10/15/23 1511 10/17/23 2355 10/18/23 0501  AST 36 21 19  ALT 34 17 15  ALKPHOS 78 57 57  BILITOT 0.8 0.8 0.8  PROT 6.7 5.8* 5.7*  ALBUMIN 3.4* 2.8* 2.8*   No results for input(s): LIPASE, AMYLASE in the last 168 hours. No results for input(s): AMMONIA in the last 168 hours. Coagulation Profile: Recent Labs  Lab 10/15/23 1511  INR 1.2   Cardiac Enzymes: No results for input(s): CKTOTAL, CKMB, CKMBINDEX, TROPONINI in the last 168 hours. BNP (last 3 results) No results for input(s): PROBNP in the last 8760 hours. HbA1C: No results for input(s): HGBA1C in the last 72 hours. CBG: No results for input(s): GLUCAP in the last 168 hours. Lipid Profile: No results for input(s): CHOL, HDL, LDLCALC, TRIG, CHOLHDL, LDLDIRECT in the last 72 hours. Thyroid  Function Tests: No results for input(s): TSH, T4TOTAL, FREET4, T3FREE, THYROIDAB in the last 72 hours. Anemia Panel: Recent Labs    10/19/23 0955  VITAMINB12 678  FOLATE 16.0  FERRITIN 531*  TIBC 212*  IRON 20*  RETICCTPCT 3.1   Sepsis Labs: Recent Labs  Lab 10/15/23 1512  LATICACIDVEN 1.8     Recent Results (from the past 240 hours)  Culture, blood (Routine x 2)     Status: None   Collection Time: 10/15/23  3:11 PM   Specimen: BLOOD  Result Value Ref Range Status   Specimen Description BLOOD RIGHT ASSIST CONTROL  Final   Special Requests   Final    BOTTLES DRAWN AEROBIC AND ANAEROBIC Blood Culture adequate volume   Culture   Final    NO GROWTH 5 DAYS Performed at [facility], [address], [place], [place] [address]    Report Status 10/20/2023 FINAL  Final  Culture, blood (Routine x 2)     Status: None   Collection Time: 10/15/23  3:59 PM   Specimen: BLOOD RIGHT FOREARM  Result Value Ref Range Status   Specimen Description BLOOD RIGHT FOREARM  Final   Special Requests   Final    BOTTLES DRAWN AEROBIC AND ANAEROBIC Blood Culture adequate volume   Culture  Final    NO GROWTH 5 DAYS Performed at [facility], [address], [place], [place] [address]    Report Status 10/20/2023 FINAL  Final         Radiology Studies: CT HEAD WO CONTRAST ( ) Result Date: 10/19/2023 CLINICAL DATA:  Mental status change, unknown cause. Subdermal hematoma follow-up. EXAM: CT HEAD WITHOUT CONTRAST TECHNIQUE: Contiguous axial images were obtained from the base of the skull through the vertex without intravenous contrast. RADIATION DOSE REDUCTION: This exam was performed according to the departmental dose-optimization program which includes automated exposure control, adjustment of the mA and/or kV according to patient size and/or use of iterative reconstruction technique. COMPARISON:  CT head without contrast FINDINGS: Brain: The extra-axial hemorrhage adjacent to the left frontal lobe is stable in size from most recent exam, measuring 8 mm maximally on coronal images. No new hemorrhage is present. Minimal local mass effect without scratched at minimal local mass effect is present without midline shift. Moderate generalized atrophy and white matter disease is stable. No acute infarct or  mass lesion is present. The ventricles are proportionate to the degree of atrophy. No other significant extra-axial hemorrhage is present. The brainstem and cerebellum are within normal limits. Midline structures are within normal limits. Vascular: Atherosclerotic calcifications are present within the cavernous internal carotid arteries bilaterally. No aneurysm is present. Skull: Calvarium is intact. No focal lytic or blastic lesions are present. No significant extracranial soft tissue lesion is present. Sinuses/Orbits: The paranasal sinuses and mastoid air cells are clear. Bilateral lens replacements are noted. Globes and orbits are otherwise unremarkable. IMPRESSION: 1. Stable 8 mm extra-axial hemorrhage adjacent to the left frontal lobe. 2. Minimal local mass effect without midline shift. 3. Stable atrophy and white matter disease. This likely reflects the sequela of chronic microvascular ischemia. Electronically Signed   By: Lonni Necessary M.D.   On: 10/19/2023 10:34        Scheduled Meds:  amLODipine   5 mg Oral Daily   pantoprazole   40 mg Oral Daily   QUEtiapine   25 mg Oral QHS   simvastatin   20 mg Oral QHS     LOS: 2 days    Time spent: 55 minutes    Rosalinda Seaman JONETTA Fairly, DO [facility]  If 7PM-7AM, please contact night-coverage [email or web address] 10/20/2023, 9:01 AM

## 2023-10-20 NOTE — Progress Notes (Signed)
   10/20/23 0600  Vitals  Temp 97.6 F (36.4 C)  Temp Source Axillary  BP (!) 156/54  MAP (mmHg) 86  BP Location Right Arm  BP Method Automatic  Patient Position (if appropriate) Lying  Pulse Rate (!) 106  Pulse Rate Source Dinamap  Resp (!) 28  MEWS COLOR  MEWS Score Color Yellow  Oxygen Therapy  SpO2 95 %  MEWS Score  MEWS Temp 0  MEWS Systolic 0  MEWS Pulse 1  MEWS RR 2  MEWS LOC 0  MEWS Score 3

## 2023-10-20 NOTE — Consult Note (Signed)
 Consultation Note Date: 10/20/2023   Patient Name: Troy Thompson  DOB: 23-Sep-1925  MRN: [id number]  Age / Sex: 89 y.o., male  PCP: Sheryle Carwin, MD Referring Physician: Maree Adron BIRCH, DO  Reason for Consultation: Establishing goals of care  HPI/Patient Profile: 89 y.o. male  with past medical history of HTN/HLD, GERD, prior stroke, relatively new diagnosis (June) of memory loss, [facility] since end of [facility], admitted on 10/17/2023 with fall/subdural hematoma.   Clinical Assessment and Goals of Care: I have reviewed medical records including EPIC notes, labs and imaging, received report from RN, assessed the patient.  Mr. Lipkin is lying in bed.  He appears acutely ill.  He does not interact with me in any meaningful way.  He clearly cannot make his needs known.  His son Nancyann and daughter-in-law Landry are present at bedside.  We meet at the bedside to discuss diagnosis prognosis, GOC, EOL wishes, disposition and options. I introduced Palliative Medicine as specialized medical care for people living with serious illness. It focuses on providing relief from the symptoms and stress of a serious illness. The goal is to improve quality of life for both the patient and the family.  We discussed a brief life review of the patient.  Mr. Brosnahan is a widow for since 2021.  He also lost a daughter in 2019.  He was first diagnosed with memory loss June of this year.  [facility] since the end of October 2024.  We then focused on their current illness.  We talk about Mr. Schreifels's frailty.  Son shares that he has been falling frequently, sometimes 3 times a week, sometimes twice per day.  We talk about his CT scans and his mental status.  We talk about meaningful improvements.  Nancyann shares that his father is a very active person and would never want to be bedbound or live in a nursing  home.  The natural disease trajectory and expectations at EOL were discussed.   The difference between aggressive medical intervention and comfort care was considered in light of the patient's goals of care.   Advanced directives, concepts specific to code status, artifical feeding and hydration, and rehospitalization were considered and discussed.  DNR verified.  We talked about the concept of let nature take its course.  Family readily states that they are ready for residential hospice placement.  Hospice and Palliative Care services outpatient were explained and offered.  Family shares that Beth's mother recently died at [facility].  They are experienced.  They endorse great care at [facility], sharing they would prefer for Mr. Langille to be transferred today if at all possible.  Discussed the importance of continued conversation with family and the medical providers regarding overall plan of care and treatment options, ensuring decisions are within the context of the patient's values and GOCs.  Questions and concerns were addressed.   The family was encouraged to call with questions or concerns.  PMT will continue to support holistically.  Conference with attending, bedside nursing staff, transition of care team related to patient condition, needs, goals of care, disposition.   HCPOA NEXT OF KIN -son, Craige Patel.    SUMMARY OF RECOMMENDATIONS   Requesting comfort and dignity and end-of-life, residential hospice at [facility] house End-of-life order set implemented   Code Status/Advance Care Planning: DNR  Symptom Management:  End-of-life order set in place  Palliative Prophylaxis:  Frequent Pain Assessment, Oral Care, and Turn Reposition  Additional Recommendations (Limitations, Scope, Preferences): Full Comfort Care  Psycho-social/Spiritual:  Desire for further Chaplaincy support:no Additional Recommendations: Caregiving  Support/Resources and Grief/Bereavement  Support  Prognosis:  < 2 weeks, based on current condition and family's desire to let nature take its course.  Discharge Planning: Requesting comfort and dignity at end-of-life, residential hospice at [facility].      Primary Diagnoses: Present on Admission:  Subdural hematoma (HCC)   I have reviewed the medical record, interviewed the patient and family, and examined the patient. The following aspects are pertinent.  Past Medical History:  Diagnosis Date   Carotid artery occlusion    Dementia (HCC)    Hypertension    Pneumonia    Retinal embolus, right 12/30/2007   Sudden onset of blindness    Stroke [facility])    Social History   Socioeconomic History   Marital status: Widowed    Spouse name: Not on file   Number of children: Not on file   Years of education: Not on file   Highest education level: Not on file  Occupational History   Not on file  Tobacco Use   Smoking status: Former    Types: Cigarettes   Smokeless tobacco: Never  Vaping Use   Vaping status: Never Used  Substance and Sexual Activity   Alcohol  use: No   Drug use: No   Sexual activity: Never  Other Topics Concern   Not on file  Social History Narrative   Not on file   Social Drivers of Health   Financial Resource Strain: Not on file  Food Insecurity: No Food Insecurity (10/18/2023)   Hunger Vital Sign    Worried About Running Out of Food in the Last Year: Never true    Ran Out of Food in the Last Year: Never true  Transportation Needs: No Transportation Needs (10/18/2023)   PRAPARE - Administrator, Civil Service (Medical): No    Lack of Transportation (Non-Medical): No  Physical Activity: Not on file  Stress: Not on file  Social Connections: Unknown (10/18/2023)   Social Connection and Isolation Panel [NHANES]    Frequency of Communication with Friends and Family: More than three times a week    Frequency of Social Gatherings with Friends and Family: Twice a week    Attends  Religious Services: Patient declined    Database Administrator or Organizations: No    Attends Banker Meetings: Patient declined    Marital Status: Widowed   Family History  Problem Relation Age of Onset   Colon cancer Father        died at 81   Cancer Father        Colon   Heart attack Mother    Hypertension Daughter    Diabetes Son    Scheduled Meds: Continuous Infusions: PRN Meds:.acetaminophen  **OR** acetaminophen , antiseptic oral rinse, glycopyrrolate  **OR** glycopyrrolate  **OR** glycopyrrolate , haloperidol  **OR** haloperidol  **OR** haloperidol  lactate, hydrALAZINE , LORazepam , morphine  CONCENTRATE, ondansetron  **OR** ondansetron  (ZOFRAN ) IV, polyvinyl alcohol  Medications Prior to Admission:  Prior to Admission medications   Medication Sig Start Date End Date Taking? Authorizing Provider  acetaminophen  (TYLENOL ) 500 MG tablet Take 1,000 mg by mouth every 8 (eight) hours as needed for moderate pain (pain score 4-6) or fever.   Yes [provider]  amLODipine  (NORVASC ) 5 MG tablet Take 5 mg by mouth daily.   Yes [provider]  aspirin  81 MG tablet Take 81 mg by mouth at bedtime.    Yes [provider]  Cholecalciferol (VITAMIN D3) 50 MCG (2000 UT) capsule Take 2,000 Units by mouth daily.   Yes [provider]  Multiple Vitamins-Minerals (PRESERVISION AREDS 2 PO) Take 1 tablet by mouth 2 (two) times daily.   Yes [provider]  omeprazole (PRILOSEC) 20 MG capsule Take 20 mg by mouth daily. 02/25/14  Yes [provider]  polyethylene glycol (MIRALAX  / GLYCOLAX ) 17 g packet Take 17 g by mouth daily.   Yes [provider]  senna-docusate (SENOKOT-S) 8.6-50 MG tablet Take 1 tablet by mouth daily as needed for mild constipation.   Yes [provider]  simvastatin  (ZOCOR ) 20 MG tablet Take 20 mg by mouth at bedtime.  02/04/12  Yes [provider]  Vitamin D, Ergocalciferol, (DRISDOL) 1.25 MG  (50000 UNIT) CAPS capsule Take 50,000 Units by mouth every 7 (seven) days. 09/04/23 10/24/23 Yes [provider]   No Known Allergies Review of Systems  Unable to perform ROS: Acuity of condition    Physical Exam Vitals and nursing note reviewed.  Constitutional:      General: He is in acute distress.     Appearance: He is ill-appearing.  HENT:     Mouth/Throat:     Mouth: Mucous membranes are dry.  Cardiovascular:     Rate and Rhythm: Normal rate.  Pulmonary:     Effort: Pulmonary effort is normal. No respiratory distress.  Neurological:     Mental Status: He is disoriented.     Vital Signs: BP (!) 150/72 (BP Location: Right Arm)   Pulse (!) 103   Temp 97.6 F (36.4 C) (Axillary)   Resp 20   Ht 5' 7 (1.702 m)   Wt 83.9 kg   SpO2 92%   BMI 28.98 kg/m  Pain Scale: PAINAD   Pain Score: 0-No pain   SpO2: SpO2: 92 % O2 Device:SpO2: 92 % O2 Flow Rate: .   IO: Intake/output summary:  Intake/Output Summary (Last 24 hours) at 10/20/2023 1411 Last data filed at 10/19/2023 1751 Gross per 24 hour  Intake 993.96 ml  Output --  Net 993.96 ml    LBM: Last BM Date : 10/17/23 Baseline Weight: Weight: 83.9 kg Most recent weight: Weight: 83.9 kg     Palliative Assessment/Data:     Time In: 1045 Time Out: 1200 Time Total: 75 minutes  Greater than 50%  of this time was spent counseling and coordinating care related to the above assessment and plan.  Signed by: Lorenza DELENA Birkenhead, NP   Please contact Palliative Medicine Team phone at [phone] for questions and concerns.  For individual provider: See Tracey

## 2023-10-20 NOTE — Plan of Care (Signed)
  Problem: Acute Rehab OT Goals (only OT should resolve) Goal: Pt. Will Perform Grooming Flowsheets (Taken 10/20/2023 0951) Pt Will Perform Grooming: with modified independence Goal: Pt. Will Perform Lower Body Bathing Flowsheets (Taken 10/20/2023 0951) Pt Will Perform Lower Body Bathing: with modified independence Goal: Pt. Will Perform Lower Body Dressing Flowsheets (Taken 10/20/2023 0951) Pt Will Perform Lower Body Dressing: with modified independence Goal: Pt. Will Transfer To Toilet Flowsheets (Taken 10/20/2023 [phone]) Pt Will Transfer to Toilet: with modified independence Goal: Pt. Will Perform Toileting-Clothing Manipulation Flowsheets (Taken 10/20/2023 0951) Pt Will Perform Toileting - Clothing Manipulation and hygiene: with modified independence Goal: Pt/Caregiver Will Perform Home Exercise Program Flowsheets (Taken 10/20/2023 [phone]) Pt/caregiver will Perform Home Exercise Program:  Increased ROM  Increased strength  Independently  Both right and left upper extremity  Kostantinos Tallman OT, MOT

## 2023-10-20 NOTE — Evaluation (Signed)
 Occupational Therapy Evaluation Patient Details Name: Troy Thompson MRN: [id number] DOB: [birth year] Today's Date: 10/20/2023   History of Present Illness NAOL ONTIVEROS is a 89 y.o. male with medical history significant of HTN, HLD, GERD, prior stroke who presents to the emergency department from the [facility] via EMS after sustaining a fall and hitting his head on a corner of a dresser.  Patient was unable to provide history of his falls possibly due to an underlying dementia, history was obtained from ED physician and ED medical record.  Per report, patient has had several falls at the facility where he lives.  He fell 3 days ago injuring his left wrist and was seen in the ED.  He had 2 falls earlier in the day yesterday without any obvious injury, and he sustained another fall in the evening whereby he had a laceration to his forehead.   Clinical Impression   Pt very lethargic. Pt had reportedly been given ativan  and was wearing mittens. Pt was very lethargic sleeping throughout the session. Activity limited by pt's lethargy level. Max A for bed mobility and sit to stand today. Difficult to assess other areas due to pt's level of arousal. Pt left in the bed with call bell within reach and bed alarm set. Mittens back in place as well. Pt will benefit from continued OT in the hospital and recommended venue below to increase strength, balance, and endurance for safe ADL's.          If plan is discharge home, recommend the following: A lot of help with walking and/or transfers;A lot of help with bathing/dressing/bathroom;Assistance with cooking/housework;Assist for transportation;Help with stairs or ramp for entrance;Direct supervision/assist for medications management    Functional Status Assessment  Patient has had a recent decline in their functional status and demonstrates the ability to make significant improvements in function in a reasonable and predictable  amount of time.  Equipment Recommendations  None recommended by OT    Recommendations for Other Services       Precautions / Restrictions Precautions Precautions: Fall Restrictions Weight Bearing Restrictions Per Provider Order: No      Mobility Bed Mobility Overal bed mobility: Needs Assistance Bed Mobility: Supine to Sit, Sit to Supine     Supine to sit: Max assist Sit to supine: Max assist   General bed mobility comments: Pt very lethargic needing much assist.    Transfers Overall transfer level: Needs assistance Equipment used: Rolling walker (2 wheels) Transfers: Sit to/from Stand Sit to Stand: Max assist           General transfer comment: Pt very lethargic max A.      Balance Overall balance assessment: Needs assistance Sitting-balance support: No upper extremity supported, Feet supported Sitting balance-Leahy Scale: Poor Sitting balance - Comments: poor to fair seated at EOB   Standing balance support: Bilateral upper extremity supported, During functional activity, Reliant on assistive device for balance Standing balance-Leahy Scale: Poor Standing balance comment: using RW                           ADL either performed or assessed with clinical judgement   ADL Overall ADL's : Needs assistance/impaired     Grooming: Maximal assistance;Sitting       Lower Body Bathing: Maximal assistance;Sitting/lateral leans       Lower Body Dressing: Maximal assistance;Sitting/lateral leans   Toilet Transfer: Maximal assistance;Rolling walker (2 wheels) Toilet Transfer Details (indicate  cue type and reason): Partially simualted via sit to stand from EOB with attempts at lateral steps with RW Toileting- Clothing Manipulation and Hygiene: Maximal assistance;Bed level               Vision Patient Visual Report:  (unsure of visual history) Vision Assessment?:  (unable to assess due to pt's level of lethargy.)     Perception Perception: Not  tested       Praxis Praxis: Not tested       Pertinent Vitals/Pain Pain Assessment Pain Assessment: Faces Faces Pain Scale: No hurt     Extremity/Trunk Assessment Upper Extremity Assessment Upper Extremity Assessment: Difficult to assess due to impaired cognition   Lower Extremity Assessment Lower Extremity Assessment: Defer to PT evaluation   Cervical / Trunk Assessment Cervical / Trunk Assessment: Kyphotic   Communication Communication Communication: Other (comment) (Pt lethargic; only mumbling today.)   Cognition Arousal: Lethargic Behavior During Therapy:  (sleeping) Overall Cognitive Status: History of cognitive impairments - at baseline                                                        Home Living Family/patient expects to be discharged to:: Assisted living                             Home Equipment: Rolling Walker (2 wheels)          Prior Functioning/Environment Prior Level of Function : Needs assist;History of Falls (last six months)             Mobility Comments: normmally uses a cane but has been falling with this.  Has RW at home not at the facility (per chart) ADLs Comments: Pt lethargic and unable to give history.        OT Problem List: Decreased strength;Decreased range of motion;Decreased activity tolerance;Impaired balance (sitting and/or standing);Decreased cognition      OT Treatment/Interventions: Self-care/ADL training;Therapeutic exercise;Therapeutic activities;Patient/family education;Cognitive remediation/compensation    OT Goals(Current goals can be found in the care plan section) Acute Rehab OT Goals Patient Stated Goal: None stated OT Goal Formulation: Patient unable to participate in goal setting Time For Goal Achievement: 11/03/23 Potential to Achieve Goals: Good  OT Frequency: Min 2X/week                                   End of Session Equipment Utilized During  Treatment: Rolling walker (2 wheels);Gait belt  Activity Tolerance: Patient limited by lethargy Patient left: in bed;with call bell/phone within reach;with bed alarm set  OT Visit Diagnosis: Unsteadiness on feet (R26.81);Other abnormalities of gait and mobility (R26.89);Muscle weakness (generalized) (M62.81);Repeated falls (R29.6);History of falling (Z91.81)                Time: 9145-9092 OT Time Calculation (min): 13 min Charges:  OT General Charges $OT Visit: 1 Visit OT Evaluation $OT Eval Low Complexity: 1 Low  Sally-Ann Cutbirth OT, MOT  Jayson Person 10/20/2023, 9:49 AM

## 2023-10-21 DIAGNOSIS — Z515 Encounter for palliative care: Secondary | ICD-10-CM | POA: Diagnosis not present

## 2023-10-21 DIAGNOSIS — S065XAA Traumatic subdural hemorrhage with loss of consciousness status unknown, initial encounter: Secondary | ICD-10-CM | POA: Diagnosis not present

## 2023-10-21 DIAGNOSIS — Z7189 Other specified counseling: Secondary | ICD-10-CM | POA: Diagnosis not present

## 2023-10-21 MED ORDER — LORAZEPAM 1 MG PO TABS: 2.0000 mg | ORAL_TABLET | ORAL | Status: DC | PRN

## 2023-10-21 MED ORDER — LORAZEPAM 2 MG/ML IJ SOLN: 2.0000 mg | INTRAMUSCULAR | Status: DC | PRN

## 2023-10-21 MED ORDER — LORAZEPAM 2 MG/ML IJ SOLN
2.0000 mg | INTRAMUSCULAR | Status: DC | PRN
  Administered 2023-10-21: 2 mg via INTRAVENOUS
  Filled 2023-10-21: qty 1

## 2023-11-08 NOTE — Discharge Summary (Signed)
 Physician Discharge Summary  Troy Thompson FMW:[id number] DOB: 1925-05-26 DOA: 10/17/2023  PCP: Sheryle Carwin, MD  Admit date: 10/17/2023  Discharge date: Nov 01, 2023  Admitted From:Home  Disposition:  Hospice home  Recommendations for Outpatient Follow-up:  Follow up with hospice facility  Home Health: None  Equipment/Devices: None  Discharge Condition:Stable  CODE STATUS: DNR/comfort care  Diet recommendation: Pleasure feeds  Brief/Interim Summary:  Troy Thompson is a 89 y.o. male with medical history significant of HTN, HLD, GERD, prior stroke who presents to the emergency department from the [facility] via EMS after sustaining a fall and hitting his head on a corner of a dresser.  Patient was unable to provide history of his falls possibly due to an underlying dementia.  Patient was noted to have subdural hematoma that appears stable on imaging.  He has been assessed by PT with need for home health services.  Patient now has worsening mentation and is having delirium.  He has been seen by palliative care and has been transition to comfort care after further discussion with family members who are now agreeable to avoid prolonging of any suffering and are accepting hospice facility.  He is in stable condition for discharge and has hospice facility available.  No other acute events or concerns noted during the course of this admission.  Discharge Diagnoses:  Principal Problem:   Subdural hematoma (HCC) Active Problems:   Multiple falls   Hypokalemia   Hypomagnesemia   Normocytic anemia   Essential hypertension   Mixed hyperlipidemia   GERD (gastroesophageal reflux disease)   History of stroke   Left wrist pain  Principal discharge diagnosis: Worsening delirium in the setting of dementia secondary to subdural hematoma with multiple falls.  Discharge Instructions  Discharge Instructions     Diet - low sodium heart healthy   Complete by: As  directed    Increase activity slowly   Complete by: As directed    No wound care   Complete by: As directed       Allergies as of November 01, 2023   No Known Allergies      Medication List     STOP taking these medications    acetaminophen  500 MG tablet Commonly known as: TYLENOL    amLODipine  5 MG tablet Commonly known as: NORVASC    aspirin  81 MG tablet   omeprazole 20 MG capsule Commonly known as: PRILOSEC   polyethylene glycol 17 g packet Commonly known as: MIRALAX  / GLYCOLAX    PRESERVISION AREDS 2 PO   senna-docusate 8.6-50 MG tablet Commonly known as: Senokot-S   simvastatin  20 MG tablet Commonly known as: ZOCOR    Vitamin D (Ergocalciferol) 1.25 MG (50000 UNIT) Caps capsule Commonly known as: DRISDOL   Vitamin D3 50 MCG (2000 UT) capsule        Follow-up Information     [facility], [facility] Follow up.   Specialty: Home Health Services Contact information: [address] [place] [address] [phone]                No Known Allergies  Consultations: Neurosurgery Palliative care   Procedures/Studies: CT HEAD WO CONTRAST ( ) Result Date: 10/19/2023 CLINICAL DATA:  Mental status change, unknown cause. Subdermal hematoma follow-up. EXAM: CT HEAD WITHOUT CONTRAST TECHNIQUE: Contiguous axial images were obtained from the base of the skull through the vertex without intravenous contrast. RADIATION DOSE REDUCTION: This exam was performed according to the departmental dose-optimization program which includes automated exposure control, adjustment of the  mA and/or kV according to patient size and/or use of iterative reconstruction technique. COMPARISON:  CT head without contrast FINDINGS: Brain: The extra-axial hemorrhage adjacent to the left frontal lobe is stable in size from most recent exam, measuring 8 mm maximally on coronal images. No new hemorrhage is present. Minimal local mass effect without scratched at minimal local mass  effect is present without midline shift. Moderate generalized atrophy and white matter disease is stable. No acute infarct or mass lesion is present. The ventricles are proportionate to the degree of atrophy. No other significant extra-axial hemorrhage is present. The brainstem and cerebellum are within normal limits. Midline structures are within normal limits. Vascular: Atherosclerotic calcifications are present within the cavernous internal carotid arteries bilaterally. No aneurysm is present. Skull: Calvarium is intact. No focal lytic or blastic lesions are present. No significant extracranial soft tissue lesion is present. Sinuses/Orbits: The paranasal sinuses and mastoid air cells are clear. Bilateral lens replacements are noted. Globes and orbits are otherwise unremarkable. IMPRESSION: 1. Stable 8 mm extra-axial hemorrhage adjacent to the left frontal lobe. 2. Minimal local mass effect without midline shift. 3. Stable atrophy and white matter disease. This likely reflects the sequela of chronic microvascular ischemia. Electronically Signed   By: Lonni Necessary M.D.   On: 10/19/2023 10:34   CT HEAD WO CONTRAST ( ) Result Date: 10/18/2023 CLINICAL DATA:  89 year old male status post unwitnessed fall, posttraumatic intracranial hemorrhage. EXAM: CT HEAD WITHOUT CONTRAST TECHNIQUE: Contiguous axial images were obtained from the base of the skull through the vertex without intravenous contrast. RADIATION DOSE REDUCTION: This exam was performed according to the departmental dose-optimization program which includes automated exposure control, adjustment of the mA and/or kV according to patient size and/or use of iterative reconstruction technique. COMPARISON:  Head CT 0007 hours today and earlier. FINDINGS: Brain: Localized and mildly lobulated left anterior cranial fossa hemorrhage measuring up to 6 mm on coronal image 23 appears stable to slightly regressed from earlier today. This is probably in the  subdural space. Contralateral smaller more subtle hyperdense right side, right lateral convexity now is difficult to identify (series 2, image 20). No IVH. No new intracranial hemorrhage identified. Basilar cisterns remain normal. No midline shift, mass effect, or evidence of intracranial mass lesion. No ventriculomegaly. Patchy and confluent white matter hypodensity. Stable gray-white matter differentiation throughout the brain. Vascular: No suspicious intracranial vascular hyperdensity. Skull: Stable and intact.  No fracture identified. Sinuses/Orbits: Visualized paranasal sinuses and mastoids are stable and well aerated. Other: Broad-based left anterior convexity forehead and scalp hematoma. No scalp soft tissue gas. Underlying frontal bones and frontal sinuses appear intact. Negative orbits soft tissues. IMPRESSION: 1. Slightly regressed posttraumatic intracranial hemorrhage since 0007 hours today: Lobulated left anterior frontal convexity probable subdural hematoma up to 6 mm. Trace and now subtle right side subdural hematoma. 2. No intracranial mass effect.  No new intracranial abnormality. 3. Left forehead scalp hematoma with no underlying skull fracture. Electronically Signed   By: VEAR Hurst M.D.   On: 10/18/2023 06:21   CT Head Wo Contrast Result Date: 10/18/2023 CLINICAL DATA:  Initial evaluation for acute head trauma. EXAM: CT HEAD WITHOUT CONTRAST TECHNIQUE: Contiguous axial images were obtained from the base of the skull through the vertex without intravenous contrast. RADIATION DOSE REDUCTION: This exam was performed according to the departmental dose-optimization program which includes automated exposure control, adjustment of the mA and/or kV according to patient size and/or use of iterative reconstruction technique. COMPARISON:  Prior CT from 10/15/2023. FINDINGS:  Brain: Cerebral volume within normal limits for age. Mild-to-moderate chronic microvascular ischemic disease. Acute extra-axial  hemorrhage overlying the anterior left frontal convexity is seen, measuring up to 8 mm in maximal thickness (series 2, image 24). No significant mass effect or midline shift. This is likely subdural in location. Additional trace extra-axial hemorrhage overlying the right temporal lobe measures up to 2 mm in thickness without significant mass effect (series 4, image 41). This is also likely subdural in location. No other acute intracranial hemorrhage. No acute large vessel territory infarct. No mass lesion or midline shift. No hydrocephalus. Vascular: No abnormal hyperdense vessel. Scattered vascular calcifications noted within the carotid siphons. Skull: Soft tissue contusion at the left frontal scalp. Calvarium intact without fracture. Sinuses/Orbits: Globes and orbital soft tissues demonstrate no acute finding. Mild mucosal thickening present about the ethmoidal air cells and maxillary sinuses. Mastoid air cells are clear. Other: None. IMPRESSION: 1. Acute extra-axial hemorrhage overlying the anterior left frontal convexity, measuring up to 8 mm in maximal thickness. No significant mass effect or midline shift. This is likely subdural in location. 2. Additional trace extra-axial hemorrhage overlying the right temporal lobe measuring up to 2 mm in thickness without significant mass effect. This is also likely subdural in nature. 3. Soft tissue contusion at the left frontal scalp. No calvarial fracture. 4. Mild-to-moderate chronic microvascular ischemic disease. Critical Value/emergent results were called by telephone at the time of interpretation on 10/18/2023 at 12:33 am to provider [facility] , who verbally acknowledged these results. Electronically Signed   By: Morene Hoard M.D.   On: 10/18/2023 00:37   CT Cervical Spine Wo Contrast Result Date: 10/18/2023 CLINICAL DATA:  Trauma/fall EXAM: CT CERVICAL SPINE WITHOUT CONTRAST TECHNIQUE: Multidetector CT imaging of the cervical spine was performed  without intravenous contrast. Multiplanar CT image reconstructions were also generated. RADIATION DOSE REDUCTION: This exam was performed according to the departmental dose-optimization program which includes automated exposure control, adjustment of the mA and/or kV according to patient size and/or use of iterative reconstruction technique. COMPARISON:  10/15/2023 FINDINGS: Alignment: Normal cervical lordosis. Skull base and vertebrae: No acute fracture. No primary bone lesion or focal pathologic process. Soft tissues and spinal canal: No prevertebral fluid or swelling. No visible canal hematoma. Disc levels: Mild degenerative changes of the mid/lower cervical spine. Spinal canal is patent. Upper chest: Visualized lung apices are clear. Other: Visualized thyroid  is unremarkable. IMPRESSION: No traumatic injury to the cervical spine. Mild degenerative changes. Electronically Signed   By: Pinkie Pebbles M.D.   On: 10/18/2023 00:32   DG Wrist Complete Left Result Date: 10/15/2023 CLINICAL DATA:  Found down after unwitnessed fall with redness, swelling, and deformity of the wrist EXAM: LEFT WRIST - COMPLETE 4 VIEW COMPARISON:  None Available. FINDINGS: There is no evidence of acute displaced fracture or dislocation. Somewhat irregular appearance of the scaphoid without definite fracture lucency. Vascular calcifications. Mild soft tissue swelling about the hand and wrist. IMPRESSION: 1. No evidence of acute displaced fracture or dislocation. 2. Somewhat irregular appearance of the scaphoid without definite fracture lucency, which may be artifactual related to positioning or represent a nondisplaced fracture. Recommend correlation with point tenderness. Follow-up radiograph in 2 weeks could be obtained to evaluate for interval healing changes. Electronically Signed   By: Limin  Xu M.D.   On: 10/15/2023 16:58   DG Chest 2 View Result Date: 10/15/2023 CLINICAL DATA:  Found down after unwitnessed fall EXAM: CHEST - 2  VIEW COMPARISON:  Chest radiograph dated 10/23/2015  FINDINGS: Low lung volumes with bronchovascular crowding. Similar asymmetric elevation of the right hemidiaphragm. Bilateral lower lung linear opacities. Bibasilar patchy opacities. Blunting of the bilateral costophrenic angles. No pneumothorax. The heart size and mediastinal contours are within normal limits. No radiographic finding of acute displaced fracture. IMPRESSION: 1. Low lung volumes with bronchovascular crowding. Bibasilar patchy opacities, likely atelectasis. Aspiration or pneumonia can be considered in the appropriate clinical setting. 2. Blunting of the bilateral costophrenic angles, which may represent trace pleural effusions. 3.  No radiographic finding of acute displaced fracture. Electronically Signed   By: Limin  Xu M.D.   On: 10/15/2023 16:54   CT Cervical Spine Wo Contrast Result Date: 10/15/2023 CLINICAL DATA:  Unwitnessed fall.  Found on the floor. EXAM: CT CERVICAL SPINE WITHOUT CONTRAST TECHNIQUE: Multidetector CT imaging of the cervical spine was performed without intravenous contrast. Multiplanar CT image reconstructions were also generated. RADIATION DOSE REDUCTION: This exam was performed according to the departmental dose-optimization program which includes automated exposure control, adjustment of the mA and/or kV according to patient size and/or use of iterative reconstruction technique. COMPARISON:  None Available. FINDINGS: Alignment: No malalignment. Skull base and vertebrae: No regional fracture. The anterior inferior corner of C7 is not included on the study. Soft tissues and spinal canal: No traumatic soft tissue finding. Disc levels: The foramen magnum is widely patent. There is ordinary mild osteoarthritis of the C1-2 articulation but no encroachment upon the neural structures. Ordinary degenerative spondylosis from C3-4 through C7-T1. Chronic facet arthritis. Probable fusion across the disc and facets at C5-6. No  compressive central canal stenosis. Bony foraminal narrowing related to spondylosis and facet arthritis. Upper chest: Limited.  Negative. Other: None IMPRESSION: 1. No acute or traumatic finding. The anterior inferior corner of C7 is not included on the study. 2. Ordinary degenerative spondylosis and facet arthritis. Probable fusion across the disc and facets at C5-6. Bony foraminal narrowing related to spondylosis and facet arthritis. Electronically Signed   By: Oneil Officer M.D.   On: 10/15/2023 16:26   CT Head Wo Contrast Result Date: 10/15/2023 CLINICAL DATA:  Unwitnessed fall.  Found on the floor. EXAM: CT HEAD WITHOUT CONTRAST TECHNIQUE: Contiguous axial images were obtained from the base of the skull through the vertex without intravenous contrast. RADIATION DOSE REDUCTION: This exam was performed according to the departmental dose-optimization program which includes automated exposure control, adjustment of the mA and/or kV according to patient size and/or use of iterative reconstruction technique. COMPARISON:  MRI 05/27/2023 FINDINGS: Brain: Age related atrophy. Chronic small-vessel ischemic changes of the white matter. No sign of acute stroke, mass, hemorrhage, hydrocephalus or extra-axial collection. Vascular: There is atherosclerotic calcification of the major vessels at the base of the brain. Skull: Negative Sinuses/Orbits: Clear other than mild mucosal thickening along the maxillary sinus floors, likely secondary to adjacent periodontal disease. Orbits negative. Other: None IMPRESSION: No acute CT finding. Age related atrophy. Chronic small-vessel ischemic changes of the white matter. Electronically Signed   By: Oneil Officer M.D.   On: 10/15/2023 16:25     Discharge Exam: Vitals:   10/20/23 2118 11/11/23 0359  BP: (!) 154/85 (!) 162/86  Pulse: (!) 108 (!) 102  Resp: (!) 25 (!) 25  Temp: 97.7 F (36.5 C) 98.2 F (36.8 C)  SpO2: 91% (!) 88%   Vitals:   10/20/23 0832 10/20/23 1305  10/20/23 2118 11/11/23 0359  BP: (!) 179/61 (!) 150/72 (!) 154/85 (!) 162/86  Pulse: 80 (!) 103 (!) 108 (!) 102  Resp: (!) 24 20 (!) 25 (!) 25  Temp:   97.7 F (36.5 C) 98.2 F (36.8 C)  TempSrc:   Axillary Axillary  SpO2: 92% 92% 91% (!) 88%  Weight:      Height:        General: Pt is alert, awake, not in acute distress Cardiovascular: RRR, S1/S2 +, no rubs, no gallops Respiratory: CTA bilaterally, no wheezing, no rhonchi Abdominal: Soft, NT, ND, bowel sounds + Extremities: no edema, no cyanosis    The results of significant diagnostics from this hospitalization (including imaging, microbiology, ancillary and laboratory) are listed below for reference.     Microbiology: Recent Results (from the past 240 hours)  Culture, blood (Routine x 2)     Status: None   Collection Time: 10/15/23  3:11 PM   Specimen: BLOOD  Result Value Ref Range Status   Specimen Description BLOOD RIGHT ASSIST CONTROL  Final   Special Requests   Final    BOTTLES DRAWN AEROBIC AND ANAEROBIC Blood Culture adequate volume   Culture   Final    NO GROWTH 5 DAYS Performed at [facility], [address] [place], [place] [address]    Report Status 10/20/2023 FINAL  Final  Culture, blood (Routine x 2)     Status: None   Collection Time: 10/15/23  3:59 PM   Specimen: BLOOD RIGHT FOREARM  Result Value Ref Range Status   Specimen Description BLOOD RIGHT FOREARM  Final   Special Requests   Final    BOTTLES DRAWN AEROBIC AND ANAEROBIC Blood Culture adequate volume   Culture   Final    NO GROWTH 5 DAYS Performed at [facility], [address] [place], [place] [address]    Report Status 10/20/2023 FINAL  Final     Labs: BNP (last 3 results) No results for input(s): BNP in the last 8760 hours. Basic Metabolic Panel: Recent Labs  Lab 10/15/23 1511 10/17/23 2355 10/18/23 0501 10/19/23 0442 10/20/23 0421  NA 135 133* 134* 136 135  K 3.2* 2.8* 3.3* 3.8 4.0  CL 100 102 103 106 103  CO2 24 22 22  23 22   GLUCOSE 163* 121* 105* 102* 124*  BUN 18 28* 24* 30* 24*  CREATININE 1.09 1.29* 1.12 1.86* 1.35*  CALCIUM 8.2* 7.6* 7.7* 7.9* 8.3*  MG  --  1.4* 1.9 1.8 1.6*  PHOS  --   --  2.8  --   --    Liver Function Tests: Recent Labs  Lab 10/15/23 1511 10/17/23 2355 10/18/23 0501  AST 36 21 19  ALT 34 17 15  ALKPHOS 78 57 57  BILITOT 0.8 0.8 0.8  PROT 6.7 5.8* 5.7*  ALBUMIN 3.4* 2.8* 2.8*   No results for input(s): LIPASE, AMYLASE in the last 168 hours. No results for input(s): AMMONIA in the last 168 hours. CBC: Recent Labs  Lab 10/15/23 1511 10/17/23 2355 10/18/23 0501 10/19/23 0442 10/20/23 0421  WBC 12.5* 7.9 6.5 6.5 9.5  NEUTROABS 8.8* 5.0  --   --   --   HGB 9.3* 7.9* 7.7* 7.5* 8.4*  HCT 29.5* 24.3* 24.8* 24.0* 26.4*  MCV 98.0 98.4 100.4* 100.0 101.1*  PLT 128* 113* 108* 110* 151   Cardiac Enzymes: No results for input(s): CKTOTAL, CKMB, CKMBINDEX, TROPONINI in the last 168 hours. BNP: Invalid input(s): POCBNP CBG: No results for input(s): GLUCAP in the last 168 hours. D-Dimer No results for input(s): DDIMER in the last 72 hours. Hgb A1c No results for input(s):  HGBA1C in the last 72 hours. Lipid Profile No results for input(s): CHOL, HDL, LDLCALC, TRIG, CHOLHDL, LDLDIRECT in the last 72 hours. Thyroid  function studies No results for input(s): TSH, T4TOTAL, T3FREE, THYROIDAB in the last 72 hours.  Invalid input(s): FREET3 Anemia work up Recent Labs    10/19/23 0955  VITAMINB12 678  FOLATE 16.0  FERRITIN 531*  TIBC 212*  IRON 20*  RETICCTPCT 3.1   Urinalysis    Component Value Date/Time   COLORURINE YELLOW 10/17/2023 2341   APPEARANCEUR HAZY (A) 10/17/2023 2341   LABSPEC 1.016 10/17/2023 2341   PHURINE 5.0 10/17/2023 2341   GLUCOSEU NEGATIVE 10/17/2023 2341   HGBUR NEGATIVE 10/17/2023 2341   BILIRUBINUR NEGATIVE 10/17/2023 2341   KETONESUR NEGATIVE 10/17/2023 2341   PROTEINUR 100 (A) 10/17/2023  2341   UROBILINOGEN 0.2 04/21/2009 0800   NITRITE NEGATIVE 10/17/2023 2341   LEUKOCYTESUR NEGATIVE 10/17/2023 2341   Sepsis Labs Recent Labs  Lab 10/17/23 2355 10/18/23 0501 10/19/23 0442 10/20/23 0421  WBC 7.9 6.5 6.5 9.5   Microbiology Recent Results (from the past 240 hours)  Culture, blood (Routine x 2)     Status: None   Collection Time: 10/15/23  3:11 PM   Specimen: BLOOD  Result Value Ref Range Status   Specimen Description BLOOD RIGHT ASSIST CONTROL  Final   Special Requests   Final    BOTTLES DRAWN AEROBIC AND ANAEROBIC Blood Culture adequate volume   Culture   Final    NO GROWTH 5 DAYS Performed at [facility], [address], [place], [place] [address]    Report Status 10/20/2023 FINAL  Final  Culture, blood (Routine x 2)     Status: None   Collection Time: 10/15/23  3:59 PM   Specimen: BLOOD RIGHT FOREARM  Result Value Ref Range Status   Specimen Description BLOOD RIGHT FOREARM  Final   Special Requests   Final    BOTTLES DRAWN AEROBIC AND ANAEROBIC Blood Culture adequate volume   Culture   Final    NO GROWTH 5 DAYS Performed at [facility], [address] [place], [place] [address]    Report Status 10/20/2023 FINAL  Final     Time coordinating discharge: 35 minutes  SIGNED:   Adron JONETTA Fairly, DO [facility] 2023/11/17, 12:59 PM  If 7PM-7AM, please contact night-coverage [email or web address]

## 2023-11-08 NOTE — Progress Notes (Signed)
 Staff called into patient's room by family, patient not breathing  with no heart rate.  Time of death called by 2 RNS. AC,MD,[facility] and ME informed of patient being deceased.  Family at bedside at this time.

## 2023-11-08 NOTE — Progress Notes (Signed)
 Pt resting mostly. Family at bedside and updated. Right arm on pillow due to swelling. Advised hospice will come around 11-12 for assessment. Swabbed mouth with water

## 2023-11-08 NOTE — Plan of Care (Signed)
   Problem: Education: Goal: Knowledge of General Education information will improve Description Including pain rating scale, medication(s)/side effects and non-pharmacologic comfort measures Outcome: Progressing

## 2023-11-08 NOTE — Care Management Important Message (Signed)
 Important Message  Patient Details  Name: Troy Thompson MRN: [id number] Date of Birth: 1925-06-03   Important Message Given:  No (discharging to residential hospice)     Corey Harold Nov 06, 2023, 2:12 PM

## 2023-11-08 NOTE — Death Summary Note (Signed)
 DEATH SUMMARY   Patient Details  Name: Troy Thompson MRN: [id number] DOB: 1925/03/30 ERE:Qjhjw, [place], MD Admission/Discharge Information   Admit Date:  10-30-2023  Date of Death:  11/03/2023  Time of Death:  1625/09/25  Length of Stay: 3   Principle Cause of death: Worsening delirium/encephalopathy in setting dementia with subdural hematoma  Hospital Diagnoses: Principal Problem:   Subdural hematoma (HCC) Active Problems:   Multiple falls   Hypokalemia   Hypomagnesemia   Normocytic anemia   Essential hypertension   Mixed hyperlipidemia   GERD (gastroesophageal reflux disease)   History of stroke   Left wrist pain   Hospital Course:  Troy Thompson is a 89 y.o. male with medical history significant of HTN, HLD, GERD, prior stroke who presents to the emergency department [facility] via EMS after sustaining a fall and hitting his head on a corner of a dresser.  Patient was unable to provide history of his falls possibly due to an underlying dementia.  Patient was noted to have subdural hematoma that appears stable on imaging.  He has been assessed by PT with need for home health services.  Patient now has worsening mentation and is having delirium.  He has been seen by palliative care and has been transition to comfort care after further discussion with family members who are now agreeable to avoid prolonging of any suffering and are accepting hospice facility.  He was awaiting transfer to hospice facility but passed while he he remained in the hospital at 1626 on 2023/11/03.   Procedures: None  Consultations: Palliative care  The results of significant diagnostics from this hospitalization (including imaging, microbiology, ancillary and laboratory) are listed below for reference.   Significant Diagnostic Studies: CT HEAD WO CONTRAST ( ) Result Date: 10/19/2023 CLINICAL DATA:  Mental status change, unknown cause. Subdermal hematoma follow-up.  EXAM: CT HEAD WITHOUT CONTRAST TECHNIQUE: Contiguous axial images were obtained from the base of the skull through the vertex without intravenous contrast. RADIATION DOSE REDUCTION: This exam was performed according to the departmental dose-optimization program which includes automated exposure control, adjustment of the mA and/or kV according to patient size and/or use of iterative reconstruction technique. COMPARISON:  CT head without contrast FINDINGS: Brain: The extra-axial hemorrhage adjacent to the left frontal lobe is stable in size from most recent exam, measuring 8 mm maximally on coronal images. No new hemorrhage is present. Minimal local mass effect without scratched at minimal local mass effect is present without midline shift. Moderate generalized atrophy and white matter disease is stable. No acute infarct or mass lesion is present. The ventricles are proportionate to the degree of atrophy. No other significant extra-axial hemorrhage is present. The brainstem and cerebellum are within normal limits. Midline structures are within normal limits. Vascular: Atherosclerotic calcifications are present within the cavernous internal carotid arteries bilaterally. No aneurysm is present. Skull: Calvarium is intact. No focal lytic or blastic lesions are present. No significant extracranial soft tissue lesion is present. Sinuses/Orbits: The paranasal sinuses and mastoid air cells are clear. Bilateral lens replacements are noted. Globes and orbits are otherwise unremarkable. IMPRESSION: 1. Stable 8 mm extra-axial hemorrhage adjacent to the left frontal lobe. 2. Minimal local mass effect without midline shift. 3. Stable atrophy and white matter disease. This likely reflects the sequela of chronic microvascular ischemia. Electronically Signed   By: Lonni Necessary M.D.   On: 10/19/2023 10:34   CT HEAD WO CONTRAST ( ) Result Date: 10/18/2023 CLINICAL DATA:  89 year old  male status post unwitnessed fall,  posttraumatic intracranial hemorrhage. EXAM: CT HEAD WITHOUT CONTRAST TECHNIQUE: Contiguous axial images were obtained from the base of the skull through the vertex without intravenous contrast. RADIATION DOSE REDUCTION: This exam was performed according to the departmental dose-optimization program which includes automated exposure control, adjustment of the mA and/or kV according to patient size and/or use of iterative reconstruction technique. COMPARISON:  Head CT 0007 hours today and earlier. FINDINGS: Brain: Localized and mildly lobulated left anterior cranial fossa hemorrhage measuring up to 6 mm on coronal image 23 appears stable to slightly regressed from earlier today. This is probably in the subdural space. Contralateral smaller more subtle hyperdense right side, right lateral convexity now is difficult to identify (series 2, image 20). No IVH. No new intracranial hemorrhage identified. Basilar cisterns remain normal. No midline shift, mass effect, or evidence of intracranial mass lesion. No ventriculomegaly. Patchy and confluent white matter hypodensity. Stable gray-white matter differentiation throughout the brain. Vascular: No suspicious intracranial vascular hyperdensity. Skull: Stable and intact.  No fracture identified. Sinuses/Orbits: Visualized paranasal sinuses and mastoids are stable and well aerated. Other: Broad-based left anterior convexity forehead and scalp hematoma. No scalp soft tissue gas. Underlying frontal bones and frontal sinuses appear intact. Negative orbits soft tissues. IMPRESSION: 1. Slightly regressed posttraumatic intracranial hemorrhage since 0007 hours today: Lobulated left anterior frontal convexity probable subdural hematoma up to 6 mm. Trace and now subtle right side subdural hematoma. 2. No intracranial mass effect.  No new intracranial abnormality. 3. Left forehead scalp hematoma with no underlying skull fracture. Electronically Signed   By: VEAR Hurst M.D.   On: 10/18/2023  06:21   CT Head Wo Contrast Result Date: 10/18/2023 CLINICAL DATA:  Initial evaluation for acute head trauma. EXAM: CT HEAD WITHOUT CONTRAST TECHNIQUE: Contiguous axial images were obtained from the base of the skull through the vertex without intravenous contrast. RADIATION DOSE REDUCTION: This exam was performed according to the departmental dose-optimization program which includes automated exposure control, adjustment of the mA and/or kV according to patient size and/or use of iterative reconstruction technique. COMPARISON:  Prior CT from 10/15/2023. FINDINGS: Brain: Cerebral volume within normal limits for age. Mild-to-moderate chronic microvascular ischemic disease. Acute extra-axial hemorrhage overlying the anterior left frontal convexity is seen, measuring up to 8 mm in maximal thickness (series 2, image 24). No significant mass effect or midline shift. This is likely subdural in location. Additional trace extra-axial hemorrhage overlying the right temporal lobe measures up to 2 mm in thickness without significant mass effect (series 4, image 41). This is also likely subdural in location. No other acute intracranial hemorrhage. No acute large vessel territory infarct. No mass lesion or midline shift. No hydrocephalus. Vascular: No abnormal hyperdense vessel. Scattered vascular calcifications noted within the carotid siphons. Skull: Soft tissue contusion at the left frontal scalp. Calvarium intact without fracture. Sinuses/Orbits: Globes and orbital soft tissues demonstrate no acute finding. Mild mucosal thickening present about the ethmoidal air cells and maxillary sinuses. Mastoid air cells are clear. Other: None. IMPRESSION: 1. Acute extra-axial hemorrhage overlying the anterior left frontal convexity, measuring up to 8 mm in maximal thickness. No significant mass effect or midline shift. This is likely subdural in location. 2. Additional trace extra-axial hemorrhage overlying the right temporal lobe  measuring up to 2 mm in thickness without significant mass effect. This is also likely subdural in nature. 3. Soft tissue contusion at the left frontal scalp. No calvarial fracture. 4. Mild-to-moderate chronic microvascular ischemic disease. Critical Value/emergent  results were called by telephone at the time of interpretation on 10/18/2023 at 12:33 am to provider DAVID [facility] , who verbally acknowledged these results. Electronically Signed   By: Morene Hoard M.D.   On: 10/18/2023 00:37   CT Cervical Spine Wo Contrast Result Date: 10/18/2023 CLINICAL DATA:  Trauma/fall EXAM: CT CERVICAL SPINE WITHOUT CONTRAST TECHNIQUE: Multidetector CT imaging of the cervical spine was performed without intravenous contrast. Multiplanar CT image reconstructions were also generated. RADIATION DOSE REDUCTION: This exam was performed according to the departmental dose-optimization program which includes automated exposure control, adjustment of the mA and/or kV according to patient size and/or use of iterative reconstruction technique. COMPARISON:  10/15/2023 FINDINGS: Alignment: Normal cervical lordosis. Skull base and vertebrae: No acute fracture. No primary bone lesion or focal pathologic process. Soft tissues and spinal canal: No prevertebral fluid or swelling. No visible canal hematoma. Disc levels: Mild degenerative changes of the mid/lower cervical spine. Spinal canal is patent. Upper chest: Visualized lung apices are clear. Other: Visualized thyroid  is unremarkable. IMPRESSION: No traumatic injury to the cervical spine. Mild degenerative changes. Electronically Signed   By: Pinkie Pebbles M.D.   On: 10/18/2023 00:32   DG Wrist Complete Left Result Date: 10/15/2023 CLINICAL DATA:  Found down after unwitnessed fall with redness, swelling, and deformity of the wrist EXAM: LEFT WRIST - COMPLETE 4 VIEW COMPARISON:  None Available. FINDINGS: There is no evidence of acute displaced fracture or dislocation. Somewhat  irregular appearance of the scaphoid without definite fracture lucency. Vascular calcifications. Mild soft tissue swelling about the hand and wrist. IMPRESSION: 1. No evidence of acute displaced fracture or dislocation. 2. Somewhat irregular appearance of the scaphoid without definite fracture lucency, which may be artifactual related to positioning or represent a nondisplaced fracture. Recommend correlation with point tenderness. Follow-up radiograph in 2 weeks could be obtained to evaluate for interval healing changes. Electronically Signed   By: Limin  Xu M.D.   On: 10/15/2023 16:58   DG Chest 2 View Result Date: 10/15/2023 CLINICAL DATA:  Found down after unwitnessed fall EXAM: CHEST - 2 VIEW COMPARISON:  Chest radiograph dated 10/23/2015 FINDINGS: Low lung volumes with bronchovascular crowding. Similar asymmetric elevation of the right hemidiaphragm. Bilateral lower lung linear opacities. Bibasilar patchy opacities. Blunting of the bilateral costophrenic angles. No pneumothorax. The heart size and mediastinal contours are within normal limits. No radiographic finding of acute displaced fracture. IMPRESSION: 1. Low lung volumes with bronchovascular crowding. Bibasilar patchy opacities, likely atelectasis. Aspiration or pneumonia can be considered in the appropriate clinical setting. 2. Blunting of the bilateral costophrenic angles, which may represent trace pleural effusions. 3.  No radiographic finding of acute displaced fracture. Electronically Signed   By: Limin  Xu M.D.   On: 10/15/2023 16:54   CT Cervical Spine Wo Contrast Result Date: 10/15/2023 CLINICAL DATA:  Unwitnessed fall.  Found on the floor. EXAM: CT CERVICAL SPINE WITHOUT CONTRAST TECHNIQUE: Multidetector CT imaging of the cervical spine was performed without intravenous contrast. Multiplanar CT image reconstructions were also generated. RADIATION DOSE REDUCTION: This exam was performed according to the departmental dose-optimization program  which includes automated exposure control, adjustment of the mA and/or kV according to patient size and/or use of iterative reconstruction technique. COMPARISON:  None Available. FINDINGS: Alignment: No malalignment. Skull base and vertebrae: No regional fracture. The anterior inferior corner of C7 is not included on the study. Soft tissues and spinal canal: No traumatic soft tissue finding. Disc levels: The foramen magnum is widely patent. There is ordinary  mild osteoarthritis of the C1-2 articulation but no encroachment upon the neural structures. Ordinary degenerative spondylosis from C3-4 through C7-T1. Chronic facet arthritis. Probable fusion across the disc and facets at C5-6. No compressive central canal stenosis. Bony foraminal narrowing related to spondylosis and facet arthritis. Upper chest: Limited.  Negative. Other: None IMPRESSION: 1. No acute or traumatic finding. The anterior inferior corner of C7 is not included on the study. 2. Ordinary degenerative spondylosis and facet arthritis. Probable fusion across the disc and facets at C5-6. Bony foraminal narrowing related to spondylosis and facet arthritis. Electronically Signed   By: Oneil Officer M.D.   On: 10/15/2023 16:26   CT Head Wo Contrast Result Date: 10/15/2023 CLINICAL DATA:  Unwitnessed fall.  Found on the floor. EXAM: CT HEAD WITHOUT CONTRAST TECHNIQUE: Contiguous axial images were obtained from the base of the skull through the vertex without intravenous contrast. RADIATION DOSE REDUCTION: This exam was performed according to the departmental dose-optimization program which includes automated exposure control, adjustment of the mA and/or kV according to patient size and/or use of iterative reconstruction technique. COMPARISON:  MRI 05/27/2023 FINDINGS: Brain: Age related atrophy. Chronic small-vessel ischemic changes of the white matter. No sign of acute stroke, mass, hemorrhage, hydrocephalus or extra-axial collection. Vascular: There is  atherosclerotic calcification of the major vessels at the base of the brain. Skull: Negative Sinuses/Orbits: Clear other than mild mucosal thickening along the maxillary sinus floors, likely secondary to adjacent periodontal disease. Orbits negative. Other: None IMPRESSION: No acute CT finding. Age related atrophy. Chronic small-vessel ischemic changes of the white matter. Electronically Signed   By: Oneil Officer M.D.   On: 10/15/2023 16:25    Microbiology: Recent Results (from the past 240 hours)  Culture, blood (Routine x 2)     Status: None   Collection Time: 10/15/23  3:11 PM   Specimen: BLOOD  Result Value Ref Range Status   Specimen Description BLOOD RIGHT ASSIST CONTROL  Final   Special Requests   Final    BOTTLES DRAWN AEROBIC AND ANAEROBIC Blood Culture adequate volume   Culture   Final    NO GROWTH 5 DAYS Performed at [facility], [address], [place], [place] [address]    Report Status 10/20/2023 FINAL  Final  Culture, blood (Routine x 2)     Status: None   Collection Time: 10/15/23  3:59 PM   Specimen: BLOOD RIGHT FOREARM  Result Value Ref Range Status   Specimen Description BLOOD RIGHT FOREARM  Final   Special Requests   Final    BOTTLES DRAWN AEROBIC AND ANAEROBIC Blood Culture adequate volume   Culture   Final    NO GROWTH 5 DAYS Performed at [facility], [address] [place], [place] [address]    Report Status 10/20/2023 FINAL  Final    Time spent: 35 minutes  Signed: Adron JONETTA Fairly, DO 2023-11-12

## 2023-11-08 NOTE — Progress Notes (Signed)
 Palliative: Troy Thompson continues with full comfort care.  End-of-life order set in place and bedside nursing staff has been giving symptom management medications as needed.  He is surrounded by loving family.  Residential hospice evaluation completed and Troy Thompson has been accepted for [facility] house with the plan for transfer today.  Conference with attending, bedside nursing staff, transition of care team related to patient condition, needs, goals of care, disposition.  Plan: Comfort and dignity at end-of-life, residential hospice at [place] [facility].  End-of-life order set in place.  35 minutes Fareeda Downard, NP Palliative medicine team Team phone [phone]

## 2023-11-08 NOTE — TOC Transition Note (Signed)
 Transition of Care [facility]) - Discharge Note   Patient Details  Name: Troy Thompson MRN: [id number] Date of Birth: 06-Mar-1925  Transition of Care [facility]) CM/SW Contact:  Lucie Lunger, LCSWA Phone Number: 2023-11-05, 1:21 PM   Clinical Narrative:    CSW updated by Lucie with Hospice that pt has been approved for [facility] residential hospice and they have a bed for pt today. CSW updated treatment team. MD completed D/C. CSW provided RN with number for report. Med necessity to be completed and printed for RN. EMS has been called. TOC signing off.   Final next level of care: Hospice Medical Facility Barriers to Discharge: Barriers Resolved   Patient Goals and CMS Choice Patient states their goals for this hospitalization and ongoing recovery are:: go to [employer].gov Compare Post Acute Care list provided to:: Patient Represenative (must comment) Choice offered to / list presented to : Adult Children      Discharge Placement                Patient to be transferred to facility by: EMS Name of family member notified: Son Patient and family notified of of transfer: 11/05/23  Discharge Plan and Services Additional resources added to the After Visit Summary for   In-house Referral: Clinical Social Work Discharge Planning Services: CM Consult                                 Social Drivers of Health (SDOH) Interventions SDOH Screenings   Food Insecurity: No Food Insecurity (10/18/2023)  Housing: Low Risk  (10/18/2023)  Transportation Needs: No Transportation Needs (10/18/2023)  Utilities: Not At Risk (10/18/2023)  Depression (PHQ2-9): Low Risk  (10/08/2022)  Social Connections: Unknown (10/18/2023)  Tobacco Use: Medium Risk (10/20/2023)     Readmission Risk Interventions    10/20/2023   12:03 PM  Readmission Risk Prevention Plan  Transportation Screening Complete  Home Care Screening Complete  Medication Review (RN CM) Complete

## 2023-11-08 DEATH — deceased

## 2023-12-15 ENCOUNTER — Inpatient Hospital Stay: Payer: PPO

## 2023-12-17 ENCOUNTER — Inpatient Hospital Stay: Payer: PPO | Admitting: Physician Assistant
